# Patient Record
Sex: Male | Born: 1960
Health system: Southern US, Community
[De-identification: ages and names within clinical notes are randomized; demographics above are authoritative.]

## PROBLEM LIST (undated history)

## (undated) DIAGNOSIS — G4730 Sleep apnea, unspecified: Secondary | ICD-10-CM

## (undated) DIAGNOSIS — N200 Calculus of kidney: Secondary | ICD-10-CM

## (undated) DIAGNOSIS — F32A Depression, unspecified: Secondary | ICD-10-CM

## (undated) DIAGNOSIS — F329 Major depressive disorder, single episode, unspecified: Secondary | ICD-10-CM

## (undated) DIAGNOSIS — B159 Hepatitis A without hepatic coma: Secondary | ICD-10-CM

## (undated) HISTORY — DX: Calculus of kidney: N20.0

## (undated) HISTORY — DX: Sleep apnea, unspecified: G47.30

## (undated) HISTORY — DX: Hepatitis a without hepatic coma: B15.9

---

## 1984-01-06 HISTORY — PX: HERNIA REPAIR: SHX51

## 2005-01-04 ENCOUNTER — Emergency Department (HOSPITAL_COMMUNITY): Admission: EM | Admit: 2005-01-04 | Discharge: 2005-01-04 | Payer: Self-pay | Admitting: Emergency Medicine

## 2009-03-04 ENCOUNTER — Emergency Department (HOSPITAL_COMMUNITY): Admission: EM | Admit: 2009-03-04 | Discharge: 2009-03-04 | Payer: Self-pay | Admitting: Emergency Medicine

## 2014-01-15 ENCOUNTER — Encounter: Payer: Self-pay | Admitting: Cardiovascular Disease

## 2014-01-15 ENCOUNTER — Ambulatory Visit (INDEPENDENT_AMBULATORY_CARE_PROVIDER_SITE_OTHER): Payer: Managed Care, Other (non HMO) | Admitting: Cardiovascular Disease

## 2014-01-15 VITALS — BP 122/90 | HR 72 | Ht 71.0 in | Wt 210.0 lb

## 2014-01-15 DIAGNOSIS — F329 Major depressive disorder, single episode, unspecified: Secondary | ICD-10-CM

## 2014-01-15 DIAGNOSIS — F419 Anxiety disorder, unspecified: Secondary | ICD-10-CM

## 2014-01-15 DIAGNOSIS — F32A Depression, unspecified: Secondary | ICD-10-CM

## 2014-01-15 DIAGNOSIS — G4710 Hypersomnia, unspecified: Secondary | ICD-10-CM

## 2014-01-15 DIAGNOSIS — G4733 Obstructive sleep apnea (adult) (pediatric): Secondary | ICD-10-CM

## 2014-01-15 DIAGNOSIS — G4730 Sleep apnea, unspecified: Secondary | ICD-10-CM

## 2014-01-15 NOTE — Patient Instructions (Signed)
Your physician has recommended that you have a sleep study. This test records several body functions during sleep, including: brain activity, eye movement, oxygen and carbon dioxide blood levels, heart rate and rhythm, breathing rate and rhythm, the flow of air through your mouth and nose, snoring, body muscle movements, and chest and belly movement. This will be done at [facility].  They will call you with appointment.  Your physician recommends that you schedule a follow-up appointment with Dr. Claiborne Billings pending sleep study results.

## 2014-01-17 ENCOUNTER — Encounter: Payer: Self-pay | Admitting: Cardiovascular Disease

## 2014-01-17 DIAGNOSIS — G4733 Obstructive sleep apnea (adult) (pediatric): Secondary | ICD-10-CM | POA: Insufficient documentation

## 2014-01-17 DIAGNOSIS — F419 Anxiety disorder, unspecified: Secondary | ICD-10-CM | POA: Insufficient documentation

## 2014-01-17 DIAGNOSIS — G4710 Hypersomnia, unspecified: Secondary | ICD-10-CM

## 2014-01-17 DIAGNOSIS — F32A Depression, unspecified: Secondary | ICD-10-CM | POA: Insufficient documentation

## 2014-01-17 DIAGNOSIS — F329 Major depressive disorder, single episode, unspecified: Secondary | ICD-10-CM | POA: Insufficient documentation

## 2014-01-17 HISTORY — DX: Obstructive sleep apnea (adult) (pediatric): G47.33

## 2014-01-17 HISTORY — DX: Hypersomnia, unspecified: G47.10

## 2014-01-17 HISTORY — DX: Anxiety disorder, unspecified: F41.9

## 2014-01-17 NOTE — Progress Notes (Signed)
Patient ID: Sean Alexander, male   DOB: 1960-12-02, 54 y.o.   MRN: [id number]    PATIENT PROFILE: [name] is a 54 year old male who is referred for evaluation of possible sleep apnea.  HPI: Sean Alexander has a history of anxiety and depression and has been on Effexor for over 20 years.  Sean Alexander is followed by Dr. Candis Schatz who prescribes his Effexor.  Sean Alexander also has taken alprazolam on an as-needed basis.  Sean Alexander is currently employed at [employer] and works long hours.  Typically, Sean Alexander may work 13 days in a row and then only be off 1 day.  Sean Alexander works from 2:30 in the afternoon until approximately 1:59 AM in the morning.  When Sean Alexander goes home from work, Sean Alexander often goes to bed between 2:30 and 3 AM and wakes up at approximately 10 to 10:30 AM.  Sean Alexander snores loudly.  His sleep is nonrestorative.  Sean Alexander wakes up feeling almost as if it's time to go to bed.  Sean Alexander symptoms have worsened over the past year.  Reportedly, Sean Alexander has had witnessed apneic spells and admits at times to waking up gasping for breath.   Sean Alexander denies any episodes of chest pain.  Sean Alexander denies any awareness of tachycardia or nocturnal palpitations.  Sean Alexander denies  bruxism, restless legs, hypnogognic hallucinations, and is unaware of any catapletic spells.  An Epworth Sleepiness Scale today was calculated and this endorsed at 17 as noted below, which is compatible with significant hypersomnolence.  Epworth Sleepiness Scale: Situation   Chance of Dozing/Sleeping (0 = never , 1 = slight chance , 2 = moderate chance , 3 = high chance )   sitting and reading 2   watching TV 3   sitting inactive in a public place 3   being a passenger in a motor vehicle for an hour or more 2   lying down in the afternoon 3   sitting and talking to someone 2   sitting quietly after lunch (no alcohol) 2   while stopped for a few minutes in traffic as the driver 0   Total Score  17    Past Medical History  Diagnosis Date  . Hepatitis A virus infection     History reviewed. No  pertinent past surgical history.   Surgical history is notable for an umbilical hernia at approximately 1987.  Allergies  Allergen Reactions  . Sulfa Antibiotics Anaphylaxis    Rash on neck and sides  . Sulfur Rash    Current Outpatient Prescriptions  Medication Sig Dispense Refill  . ALPRAZolam (XANAX) 0.5 MG tablet Take 1 tablet by mouth as needed.    . Venlafaxine HCl 75 MG TB24 Take 1 tablet by mouth every morning.  0   No current facility-administered medications for this visit.    History   Social History  . Marital Status: Single    Spouse Name: N/A    Number of Children: N/A  . Years of Education: N/A   Occupational History  . Not on file.   Social History Main Topics  . Smoking status: Never Smoker   . Smokeless tobacco: Never Used  . Alcohol Use: No  . Drug Use: Not on file  . Sexual Activity: Not on file   Other Topics Concern  . Not on file   Social History Narrative   Socially Sean Alexander is married for 30 years.  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  Sean Alexander completed 14 grades of education.  There is no tobacco history.  Sean Alexander does not use illicit drugs, but does take occasional Xanax.  Sean Alexander does not routinely exercise.  Family History  Problem Relation Age of Onset  . Heart disease Mother   . Heart disease Father   . Diabetes Brother      ROS General: Negative; No fevers, chills, or night sweats HEENT: Negative; No changes in vision or hearing, sinus congestion, difficulty swallowing Pulmonary: Negative; No cough, wheezing, shortness of breath, hemoptysis Cardiovascular: Negative; No chest pain, presyncope, syncope, palpatations GI: Negative; No nausea, vomiting, diarrhea, or abdominal pain GU: Negative; No dysuria, hematuria, or difficulty voiding Musculoskeletal: Negative; no myalgias, joint pain, or weakness Hematologic: Negative; no easy bruising, bleeding Endocrine: Negative; no heat/cold intolerance Neuro: Negative; no changes in balance,  headaches Skin: Negative; No rashes or skin lesions Psychiatric: Positive for depression and anxiety Sleep: Positive for loud snoring, witnessed apnea, hypersomnolence and daytime sleepiness;  no bruxism, restless legs, hypnogognic hallucinations, no cataplexy   Physical Exam BP 122/90 mmHg  Pulse 72  Ht 5\' 11"  (1.803 m)  Wt 210 lb (95.255 kg)  BMI 29.30 kg/m2  PF 122 L/min  General: Alert, oriented, no distress.  Skin: normal turgor, no rashes HEENT: Normocephalic, atraumatic. Pupils round and reactive; sclera anicteric; extraocular muscles intact; Fundi normal Nose without nasal septal hypertrophy Mouth/Parynx benign; Mallinpatti scale 3 Neck: No JVD, no carotid bruits with normal carotid upstroke Lungs: clear to ausculatation and percussion; no wheezing or rales  Chest wall: No tenderness to palpation Heart: RRR, s1 s2 normal; no S3 or S4 gallop.  No rubs thrills or heaves  Abdomen: soft, nontender; no hepatosplenomehaly, BS+; abdominal aorta nontender and not dilated by palpation. Back: No CVA tenderness Pulses 2+ Extremities: no clubbinbg cyanosis or edema, Homan's sign negative  Neurologic: grossly nonfocal; cranial nerves intact. Psychological: Normal affect and mood.   LABS:  BMET No results found for: NA, K, CL, CO2, GLUCOSE, BUN, CREATININE, CALCIUM, GFRNONAA, GFRAA   Hepatic Function Panel  No results found for: PROT, ALBUMIN, AST, ALT, ALKPHOS, BILITOT, BILIDIR, IBILI   CBC No results found for: WBC, RBC, HGB, HCT, PLT, MCV, MCH, MCHC, RDW, LYMPHSABS, MONOABS, EOSABS, BASOSABS   BNP No results found for: PROBNP  Lipid Panel  No results found for: CHOL, TRIG, HDL, CHOLHDL, VLDL, LDLCALC, LDLDIRECT   RADIOLOGY: No results found.    ASSESSMENT AND PLAN: My impression is that Sean Alexander is a 54 -year-old male who presents with a symptom on plaques highly suggestive of obstructive sleep apnea.  However, the patient has a long-standing  history of depression for which she has been on Effexor therapy.  Is very possible that this may be suppressing REM sleep, which may play a role in his nonrestorative sleep, and fatigability.  However, Sean Alexander has had witnessed apneic spells and has been found to snore loudly.  I am scheduling him for a diagnostic polysomnogram with possible split-night study for further evaluation.  I discussed with him.  Normal sleep architecture and the adverse effects of sleep apnea on sleep architecture if present.  I also discussed with him the adverse cardiovascular effects associated with untreated sleep apnea.  There is family history for heart disease in both parents.  We will try to expedite his sleep study, but presently there is a significant delay in scheduling studies.     Troy Sine, MD, [facility]  01/17/2014 7:58 PM

## 2014-03-13 ENCOUNTER — Ambulatory Visit (HOSPITAL_BASED_OUTPATIENT_CLINIC_OR_DEPARTMENT_OTHER): Payer: Managed Care, Other (non HMO) | Attending: Cardiovascular Disease

## 2014-03-13 VITALS — Ht 71.0 in | Wt 210.0 lb

## 2014-03-13 DIAGNOSIS — F329 Major depressive disorder, single episode, unspecified: Secondary | ICD-10-CM

## 2014-03-13 DIAGNOSIS — G4730 Sleep apnea, unspecified: Secondary | ICD-10-CM | POA: Insufficient documentation

## 2014-03-13 DIAGNOSIS — G4733 Obstructive sleep apnea (adult) (pediatric): Secondary | ICD-10-CM

## 2014-03-13 DIAGNOSIS — F32A Depression, unspecified: Secondary | ICD-10-CM

## 2014-04-03 ENCOUNTER — Telehealth: Payer: Self-pay | Admitting: Cardiovascular Disease

## 2014-04-03 NOTE — Telephone Encounter (Signed)
Pt called in stating that he had a sleep study done on 3/8 and he has not received his results yet. Please f/u with pt  Thanks

## 2014-04-03 NOTE — Telephone Encounter (Signed)
There is not a report in the system so apparently it hasn't been read by Dr. Claiborne Billings yet.

## 2014-04-04 ENCOUNTER — Telehealth: Payer: Self-pay | Admitting: Cardiovascular Disease

## 2014-04-04 NOTE — Addendum Note (Signed)
Addended by: Shelva Majestic A on: 04/04/2014 05:45 PM   Modules accepted: Level of Service

## 2014-04-04 NOTE — Sleep Study (Signed)
   NAME: Sean Alexander DATE OF BIRTH:  1960/12/07 MEDICAL RECORD NUMBER [id number]  LOCATION: [facility]  PHYSICIAN: Adryan Druckenmiller A  DATE OF STUDY: 03/13/2014  SLEEP STUDY TYPE: Nocturnal Polysomnogram               REFERRING PHYSICIAN: Troy Sine, MD  INDICATION FOR STUDY: Sean Alexander is a 54 year old white male who admits to loud snoring, daytime sleepiness, and fatigue.  His sleep is nonrestorative.  He is referred for diagnostic polysomnogram to assess for obstructive sleep apnea.  EPWORTH SLEEPINESS SCORE:  17 HEIGHT: 5\' 11"  (180.3 cm)  WEIGHT: 210 lb (95.255 kg)    Body mass index is 29.3 kg/(m^2).  NECK SIZE: 17.5 in.  MEDICATIONS:  Venlafaxine HCl 75 MG TB24 1 tablet, Every morning - 10a  Note: Received from: External Pharmacy (Written 01/15/2014 1210)  ALPRAZolam (XANAX) 0.5 MG tablet 1 tablet, As needed  Note: Received from: External Pharmacy Received Sig: (Written 01/15/2014     SLEEP ARCHITECTURE:  The patient slept for 237 minutes out of a sleep period of time of 322.5 minutes.  Percent sleep efficiency was reduced at 61.6%.  The patient spent 7.5 minutes (3.2%) in stage I, 225 minutes (94.9%) in stage II, 4.5 minutes (1.9%) in stage III, and 0 minutes in REM sleep.  He slept 75 minutes or 31.6% in supine sleep.  Sleep architecture was abnormal with reduced slow wave sleep and absence of REM sleep.  There was mild to moderate snoring.  There were 25 arousals with an index of 6.3.  RESPIRATORY DATA:  During the sleep period, the patient had 1 obstructive apneas, 0 central and 0 mixed apneas, and 20 hypopneas of which 19 occurred in the supine position.  The apnea hypopnea index (AHI) was 5.3 per hour and respiratory disturbance index (RDI) was 7.3 per hour.  This places the patient in the category of mild obstructive sleep apnea.  However, REM sleep was not achieved and the reported AHI may underestimate the severity of the patient's sleep apnea if REM  sleep was present.  OXYGEN DATA:  The baseline oxygen saturation was 93%.  The oxygen nadir was 80% with non-REM sleep.  CARDIAC DATA:  The patient was in sinus rhythm with an average heart rate of 65 bpm.  There were rare to occasional PACs.  MOVEMENT/PARASOMNIA:  There were 6 periodic limb movements with an index of 1.5.  IMPRESSION/ RECOMMENDATION:   Mild obstructive sleep apnea/hypopnea syndrome; however, the AHI may underestimate the potential severity of the patient's sleep apnea due to the absence of REM sleep on the present study. Respiratory events with oxygen desaturation to a nadir of 80% during non-REM sleep. Mild to moderate snoring. Reduced sleep efficiency. Nocturnal myoclonus with questionable clinical significance. Rare occasional PACs.  In light of the patient's significant symptomatology and oxygen desaturation to a nadir of 80% CPAP titration is recommended.  If the patient does not wish to pursue CPAP therapy, alternatives to therapy, such as a customized oral dental appliance is recommended. Effort should be made to optimize nasal and oropharyngeal patency. The absence of REM sleep may be contributed by the patient's antidepressant medication therapy. The patient should be counseled to try avoiding sleep in the supine position  Sean Alexander A Diplomate, American Board of Sleep Medicine  ELECTRONICALLY SIGNED ON:  04/04/2014, 5:25 PM [place] PH: [phone]   FX: [phone] [place]

## 2014-04-04 NOTE — Telephone Encounter (Signed)
Returned call to patient sleep study results not available.Stated he had sleep study done 03/14/14.Message sent to [facility] and Mariann Laster.

## 2014-04-04 NOTE — Telephone Encounter (Signed)
Mild sleep apnea, but no REM sleep . O2 desaturation to 80%; mild-mod snoring. Schedule for CPAP. Avoid sleeping on back. If he is against CPAP then customized oral appliance.

## 2014-04-04 NOTE — Telephone Encounter (Signed)
Pt says he had a sleep study 3 weeks ago,he have not heard anything from it.

## 2014-04-05 NOTE — Telephone Encounter (Signed)
Left message that I will call him again to discuss sleep results.

## 2014-04-09 NOTE — Telephone Encounter (Signed)
Returning your call. °

## 2014-04-10 ENCOUNTER — Other Ambulatory Visit: Payer: Self-pay | Admitting: *Deleted

## 2014-04-10 DIAGNOSIS — G4733 Obstructive sleep apnea (adult) (pediatric): Secondary | ICD-10-CM

## 2014-04-10 NOTE — Telephone Encounter (Signed)
Pt's wife  called in stating that he had a sleep study done on 3/8 and she would like to get the results . Please give her a call back  Thanks

## 2014-04-10 NOTE — Telephone Encounter (Signed)
Patient returned a call and sleep study results and recommendations were discussed with patient. He voiced understanding and a CPAP titration study will be ordered.

## 2014-04-10 NOTE — Telephone Encounter (Signed)
Left message to return a call regarding sleep study.

## 2014-05-17 NOTE — Progress Notes (Signed)
Schedule for cpap

## 2014-05-23 NOTE — Progress Notes (Signed)
Patient called and given sleep study results and recommendations. CPAP titration scheduled for 06/05/14.

## 2014-06-05 ENCOUNTER — Ambulatory Visit (HOSPITAL_BASED_OUTPATIENT_CLINIC_OR_DEPARTMENT_OTHER): Payer: Managed Care, Other (non HMO) | Attending: Cardiovascular Disease

## 2014-06-05 VITALS — Ht 71.0 in | Wt 195.0 lb

## 2014-06-05 DIAGNOSIS — G4733 Obstructive sleep apnea (adult) (pediatric): Secondary | ICD-10-CM

## 2014-06-05 DIAGNOSIS — R0683 Snoring: Secondary | ICD-10-CM | POA: Insufficient documentation

## 2014-06-05 DIAGNOSIS — R5383 Other fatigue: Secondary | ICD-10-CM | POA: Insufficient documentation

## 2014-06-05 DIAGNOSIS — G4730 Sleep apnea, unspecified: Secondary | ICD-10-CM | POA: Insufficient documentation

## 2014-06-10 NOTE — Addendum Note (Signed)
Addended by: Shelva Majestic A on: 06/10/2014 01:53 PM   Modules accepted: Level of Service

## 2014-06-10 NOTE — Sleep Study (Signed)
   NAME: Sean Alexander DATE OF BIRTH:  [birth year] MEDICAL RECORD NUMBER [id number]  LOCATION: [facility]  PHYSICIAN: Kadin Canipe A  DATE OF STUDY: 06/05/2014  SLEEP STUDY TYPE: Nocturnal Polysomnogram               REFERRING PHYSICIAN: Troy Sine, MD  INDICATION FOR STUDY:  [name] is a 54 year old male who had experienced loud snoring, daytime sleepiness and fatigue.  A diagnostic polysomnogram revealed sleep apnea with an AHI of 5.3/hr, RDI of 7.3/hr, but REM sleep was not achieved.  He is referred for CPAP titration trial.  EPWORTH SLEEPINESS SCORE:  17 HEIGHT: 5\' 11"  (180.3 cm)  WEIGHT: 88.451 kg (195 lb)    Body mass index is 27.21 kg/(m^2).  NECK SIZE: 17.5 in.  MEDICATIONS:  ALPRAZolam (XANAX) 0.5 MG tablet 1 tablet, As needed  Note: Received from: External Pharmacy Received Sig: (Written 01/15/2014 1210)  Venlafaxine HCl 75 MG TB24 1 tablet, Every morning - 10a  SLEEP ARCHITECTURE:  Total recording time was 419 minutes.  Total sleep time 353.5 minutes.  Percent sleep efficiency was 84.4%.  The patient slept 23.5 minutes (6.6%) in stage I, 276 minutes (78.1%) in stage II, 1.5 minutes (0.4%) in stage III, and was able to achieve REM sleep and slept for 52.5 minutes (14.9%).  He slept for 48.4% of the night in the supine position, with supine REM sleep at 14.6%.  There were 24 arousals, with an index of 4.1 per hour.  RESPIRATORY DATA:  CPAP was started at 4 cm water pressure and was titrated up to 8 cm pressure.  Initially, the patient was unable to tolerate a full face mask. He was able to tolerate nasal pillows. Pressures were increased for snoring and respiratory events.  The majority of the respiratory events were in the supine position and during grams sleep.  The AHI 8 cm water pressure was 0.  Oral venting was noted during REM sleep and during the latter portion of the study.  OXYGEN DATA:  The baseline oxygen saturation was 96%.  The lowest  oxygen saturation was 86% in REM sleep, which occurred at 7 cm water pressure.  CARDIAC DATA:  The average heart rate was 62 bpm.  There were occasional PACs.  MOVEMENT/PARASOMNIA:  There were a total of 77 periodic limb movements, with an index of 13.1.  There were 2 periodic limb movement arousals with index of 0.3.  IMPRESSION/ RECOMMENDATION:   The patient tolerated CPAP well and was able to achieve REM supine sleep.  Recommend initial CPAP with an EPR of 3 at 8 cm water pressure and heated humidification.  Recommend nasal pillows with a chinstrap due to oral venting.  Recommend a download be obtained in 30 days in sleep clinic follow-up evaluation.    [place], American Board of Sleep Medicine  ELECTRONICALLY SIGNED ON:  06/10/2014, 1:42 PM [place] PH: [phone]   FX: [phone] [place]

## 2014-06-13 NOTE — Progress Notes (Signed)
Referred to [facility] for CPAP setup.

## 2014-06-15 ENCOUNTER — Encounter: Payer: Self-pay | Admitting: Cardiovascular Disease

## 2014-07-11 ENCOUNTER — Telehealth: Payer: Self-pay | Admitting: Cardiovascular Disease

## 2014-07-11 NOTE — Telephone Encounter (Signed)
Pt's wife called in stating that the pt had a sleep study done on 5/31 and they have not heard back about the results. Please call  Thanks

## 2014-07-11 NOTE — Telephone Encounter (Signed)
Called pt. Several times , phone keeps ringing busy

## 2014-07-23 ENCOUNTER — Telehealth: Payer: Self-pay | Admitting: Cardiovascular Disease

## 2014-07-23 NOTE — Telephone Encounter (Signed)
Spoke with wife she states the patient has not heard about the second part of C- PAP STUDY FROM 05/2014 RN informed wife - will have to investigate and call her back

## 2014-07-23 NOTE — Telephone Encounter (Signed)
Spoke to wife RN informed wife , the information that was received. RN apologized to wife, - not sure which DME company to use, will have to wait until t Mariann Laster returns   Wife aware to call 08/02/14- 08/03/14 for the next step

## 2014-07-23 NOTE — Telephone Encounter (Signed)
Spoke to  [facility] Know the status of patient being set up for C-PAP,order was faxed on 06/13/14  Per Judson Roch , order was cancelled, because company was out of network with patient's  Insurance [place] ) She states information was faxed but know confirmation date. RN thanked Judson Roch

## 2014-07-23 NOTE — Telephone Encounter (Signed)
Pt's wife is calling in to speak with Dr. Claiborne Billings about the results from his sleep study that was done on 5/31. Please f/u with her   Thanks

## 2014-08-02 NOTE — Telephone Encounter (Signed)
Called and spoke with Ivin Booty @ [facility] to see if they took patient's [employer]. She informs me that they take " some" [employer]. She requested for me to send her the referral. If they cannot do it then she will forward it to the appropriate MDE company. Patient's wife notified.

## 2014-11-14 ENCOUNTER — Telehealth: Payer: Self-pay | Admitting: *Deleted

## 2014-11-14 NOTE — Telephone Encounter (Signed)
Faxed order for [facility]AP download to [facility].

## 2014-11-15 ENCOUNTER — Telehealth: Payer: Self-pay | Admitting: Cardiology

## 2014-11-15 NOTE — Telephone Encounter (Signed)
Sean Alexander called in stating that an order was received from our office about the pt doing a download. But Sean Alexander says that he does not have a CPAP machine with them. Please f/u with her if you need to.  Thanks

## 2014-11-21 ENCOUNTER — Ambulatory Visit (INDEPENDENT_AMBULATORY_CARE_PROVIDER_SITE_OTHER): Payer: Managed Care, Other (non HMO) | Admitting: Cardiovascular Disease

## 2014-11-21 VITALS — BP 122/78 | HR 69 | Ht 71.0 in | Wt 203.4 lb

## 2014-11-21 DIAGNOSIS — F329 Major depressive disorder, single episode, unspecified: Secondary | ICD-10-CM | POA: Diagnosis not present

## 2014-11-21 DIAGNOSIS — Z9989 Dependence on other enabling machines and devices: Secondary | ICD-10-CM

## 2014-11-21 DIAGNOSIS — G4710 Hypersomnia, unspecified: Secondary | ICD-10-CM

## 2014-11-21 DIAGNOSIS — F419 Anxiety disorder, unspecified: Secondary | ICD-10-CM | POA: Diagnosis not present

## 2014-11-21 DIAGNOSIS — F32A Depression, unspecified: Secondary | ICD-10-CM

## 2014-11-21 DIAGNOSIS — G4733 Obstructive sleep apnea (adult) (pediatric): Secondary | ICD-10-CM | POA: Diagnosis not present

## 2014-11-21 NOTE — Patient Instructions (Addendum)
Your physician recommends that you schedule a follow-up appointment in 1 year or sooner if needed for sleep.

## 2014-11-23 ENCOUNTER — Encounter: Payer: Self-pay | Admitting: Cardiovascular Disease

## 2014-11-23 DIAGNOSIS — G4733 Obstructive sleep apnea (adult) (pediatric): Secondary | ICD-10-CM | POA: Insufficient documentation

## 2014-11-23 DIAGNOSIS — Z9989 Dependence on other enabling machines and devices: Secondary | ICD-10-CM

## 2014-11-23 HISTORY — DX: Obstructive sleep apnea (adult) (pediatric): G47.33

## 2014-11-23 NOTE — Progress Notes (Signed)
Patient ID: Sean Alexander, male   DOB: 1960/12/22, 54 y.o.   MRN: [id number]    HPI:  Sean Alexander is a 54 year old male who was referred for evaluation of possible sleep apnea.  He presents to the office today in the sleep clinic following initiation of CPAP therapy.   Sean Alexander has a history of anxiety and depression and has been on Effexor for over 20 years.  He is followed by Dr. Candis Alexander who prescribes his Effexor.  He also has taken alprazolam on an as-needed basis.   I had seen him for an initial evaluation in January 2016. He is currently employed at [employer] and works long hours.  Typically, he may work 13 days in a row and then only be off 1 day.  He works from 2:30 in the afternoon until approximately 1:59 AM in the morning.  When he goes home from work, he often goes to bed between 2:30 and 3 AM and wakes up at approximately 10 to 10:30 AM.  He snores loudly.  His sleep is nonrestorative.  He wakes up feeling almost as if it's time to go to bed.  He symptoms have worsened over the past year.  Reportedly, he has had witnessed apneic spells and admits at times to waking up gasping for breath.   When I initially saw him, he denied any episodes of chest pain, awareness of tachycardia or nocturnal palpitations.  He denied  bruxism, restless legs, hypnogognic hallucinations, and is unaware of any catapletic spells.  An Epworth Sleepiness Scale at that visit was calculated and  endorsed at 17 as noted below, which is compatible with significant hypersomnolence.  Epworth Sleepiness Scale: Situation   Chance of Dozing/Sleeping (0 = never , 1 = slight chance , 2 = moderate chance , 3 = high chance )   sitting and reading 2   watching TV 3   sitting inactive in a public place 3   being a passenger in a motor vehicle for an hour or more 2   lying down in the afternoon 3   sitting and talking to someone 2   sitting quietly after lunch (no alcohol) 2   while stopped for a few minutes in  traffic as the driver 0   Total Score  17    I referred him to undergo a diagnostic polysomnogram which was done on  03/13/2014. He was found to have mild obstructive sleep apnea/hypopnea syndrome; however, it was felt that the rest AHI was underestimated due to absence of rum sleeve on his study. His AHI was 5.3 and RDI 7.3. However, he had significant desaturation to 80% oxygen with non-REM sleep. Rare to occasional PACs.  He was referred for CPAP titration and ultimately 8.  Senna meter water pressure was recommended. Since initiating CPAP therapy, he has felt improved. His sleep is  More restorative.  A download was just obtained from 10/15/2014 through  November 2008 2016. He has met compliance with reference to usage stays being 77%.  However, he was just short of being compliant with greater than 4 hours use being only at 63%.  On days used is average time was 6 hours and 24 minutes.  His AHI is excellent at 8 cm water pressure at 0.8 per hour. He has a ResMed AirSense 10 AutoSet unit.  Past Medical History  Diagnosis Date  . Hepatitis A virus infection     No past surgical history on file.   Surgical  history is notable for an umbilical hernia at approximately 1987.  Allergies  Allergen Reactions  . Sulfa Antibiotics Anaphylaxis    Rash on neck and sides  . Sulfur Rash    Current Outpatient Prescriptions  Medication Sig Dispense Refill  . ALPRAZolam (XANAX) 0.5 MG tablet Take 1 tablet by mouth as needed.    . Venlafaxine HCl 75 MG TB24 Take 1 tablet by mouth every morning.  0   No current facility-administered medications for this visit.    Social History   Social History  . Marital Status: Married    Spouse Name: N/A  . Number of Children: N/A  . Years of Education: N/A   Occupational History  . Not on file.   Social History Main Topics  . Smoking status: Never Smoker   . Smokeless tobacco: Never Used  . Alcohol Use: No  . Drug Use: Not on file  . Sexual  Activity: Not on file   Other Topics Concern  . Not on file   Social History Narrative   Socially he is married for 30 years.  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  He completed 14 grades of education.  There is no tobacco history.  He does not use illicit drugs, but does take occasional Xanax.  He does not routinely exercise.  Family History  Problem Relation Age of Onset  . Heart disease Mother   . Heart disease Father   . Diabetes Brother      ROS General: Negative; No fevers, chills, or night sweats HEENT: Negative; No changes in vision or hearing, sinus congestion, difficulty swallowing Pulmonary: Negative; No cough, wheezing, shortness of breath, hemoptysis Cardiovascular: Negative; No chest pain, presyncope, syncope, palpatations GI: Negative; No nausea, vomiting, diarrhea, or abdominal pain GU: Negative; No dysuria, hematuria, or difficulty voiding Musculoskeletal: Negative; no myalgias, joint pain, or weakness Hematologic: Negative; no easy bruising, bleeding Endocrine: Negative; no heat/cold intolerance Neuro: Negative; no changes in balance, headaches Skin: Negative; No rashes or skin lesions Psychiatric: Positive for depression and anxiety Sleep: Positive for loud snoring, witnessed apnea, hypersomnolence and daytime sleepiness;  no bruxism, restless legs, hypnogognic hallucinations, no cataplexy   Physical Exam BP 122/78 mmHg  Pulse 69  Ht _0  (1.803 m)  Wt 203 lb 6.4 oz (92.262 kg)  BMI 28.38 kg/m2   Wt Readings from Last 3 Encounters:  11/21/14 203 lb 6.4 oz (92.262 kg)  06/05/14 195 lb (88.451 kg)  03/13/14 210 lb (95.255 kg)   General: Alert, oriented, no distress.  Skin: normal turgor, no rashes HEENT: Normocephalic, atraumatic. Pupils round and reactive; sclera anicteric; extraocular muscles intact; Fundi normal Nose without nasal septal hypertrophy Mouth/Parynx benign; Mallinpatti scale 3 Neck: No JVD, no carotid bruits with normal  carotid upstroke Lungs: clear to ausculatation and percussion; no wheezing or rales  Chest wall: No tenderness to palpation Heart: RRR, s1 s2 normal; no S3 or S4 gallop.  No rubs thrills or heaves  Abdomen: soft, nontender; no hepatosplenomehaly, BS+; abdominal aorta nontender and not dilated by palpation. Back: No CVA tenderness Pulses 2+ Extremities: no clubbinbg cyanosis or edema, Homan's sign negative  Neurologic: grossly nonfocal; cranial nerves intact. Psychological: Normal affect and mood.   LABS:  BMET No results found for: NA, K, CL, CO2, GLUCOSE, BUN, CREATININE, CALCIUM, GFRNONAA, GFRAA   Hepatic Function Panel  No results found for: PROT, ALBUMIN, AST, ALT, ALKPHOS, BILITOT, BILIDIR, IBILI   CBC No results found for: WBC, RBC, HGB, HCT,  PLT, MCV, MCH, MCHC, RDW, LYMPHSABS, MONOABS, EOSABS, BASOSABS   BNP No results found for: PROBNP  Lipid Panel  No results found for: CHOL, TRIG, HDL, CHOLHDL, VLDL, LDLCALC, LDLDIRECT   RADIOLOGY: No results found.    ASSESSMENT AND PLAN: My impression is that Sean Alexander is a 62 -year-old male who  I saw early this year and was concerned about a symptom complex, highly suggestive of obstructive sleep apnea. .  He has a history of depression and has been on antidepressant medication which may be suppressing his REM sleep. I had a long discussion with both he and his wife and reviewed both his nocturnal polysomnogram as well as his CPAP titration.  I discussed normal sleep architecture and the effect that sleep apnea has on development of abnormal sleep architecture.  We also discussed potential adverse cardiovascular consequences if sleep apnea is left untreated. He is meeting compliance standards with reference to days used but I had a long discussion with him regarding the importance of using CPAP throughout the entire night in order to meet compliance standards a minimum of 4 hours as necessary.  He is working the  second shift and typically goes to sleep at 1 AM. On therapy, his AHI is excellent at 0.8.  He no longer is snoring.  His sleep is more restorative. He uses [employer] for his DME company. I answered all his questions.  I will see him in one year for reevaluation.   Time spent: 25 minutes   Troy Sine, MD, [facility]  11/23/2014 10:40 PM

## 2015-01-06 DIAGNOSIS — N200 Calculus of kidney: Secondary | ICD-10-CM

## 2015-01-06 HISTORY — DX: Calculus of kidney: N20.0

## 2015-07-26 ENCOUNTER — Encounter: Payer: Self-pay | Admitting: Internal Medicine

## 2015-07-31 ENCOUNTER — Emergency Department (HOSPITAL_BASED_OUTPATIENT_CLINIC_OR_DEPARTMENT_OTHER)
Admission: EM | Admit: 2015-07-31 | Discharge: 2015-07-31 | Disposition: A | Discharge status: Home / Self Care | Payer: Managed Care, Other (non HMO) | Attending: Emergency Medicine | Admitting: Emergency Medicine

## 2015-07-31 ENCOUNTER — Emergency Department (HOSPITAL_BASED_OUTPATIENT_CLINIC_OR_DEPARTMENT_OTHER): Payer: Managed Care, Other (non HMO)

## 2015-07-31 ENCOUNTER — Encounter (HOSPITAL_BASED_OUTPATIENT_CLINIC_OR_DEPARTMENT_OTHER): Payer: Self-pay

## 2015-07-31 DIAGNOSIS — Z79899 Other long term (current) drug therapy: Secondary | ICD-10-CM | POA: Insufficient documentation

## 2015-07-31 DIAGNOSIS — N23 Unspecified renal colic: Secondary | ICD-10-CM | POA: Diagnosis not present

## 2015-07-31 DIAGNOSIS — N200 Calculus of kidney: Secondary | ICD-10-CM | POA: Diagnosis not present

## 2015-07-31 DIAGNOSIS — R109 Unspecified abdominal pain: Secondary | ICD-10-CM | POA: Diagnosis present

## 2015-07-31 LAB — CBC WITH DIFFERENTIAL/PLATELET
BASOS ABS: 0 10*3/uL (ref 0.0–0.1)
Basophils Relative: 0 %
EOS ABS: 0 10*3/uL (ref 0.0–0.7)
EOS PCT: 0 %
HCT: 38.7 % — ABNORMAL LOW (ref 39.0–52.0)
Hemoglobin: 13.2 g/dL (ref 13.0–17.0)
LYMPHS ABS: 1.3 10*3/uL (ref 0.7–4.0)
Lymphocytes Relative: 13 %
MCH: 31.7 pg (ref 26.0–34.0)
MCHC: 34.1 g/dL (ref 30.0–36.0)
MCV: 92.8 fL (ref 78.0–100.0)
MONO ABS: 0.8 10*3/uL (ref 0.1–1.0)
Monocytes Relative: 9 %
Neutro Abs: 7.6 10*3/uL (ref 1.7–7.7)
Neutrophils Relative %: 78 %
PLATELETS: 171 10*3/uL (ref 150–400)
RBC: 4.17 MIL/uL — AB (ref 4.22–5.81)
RDW: 12.5 % (ref 11.5–15.5)
WBC: 9.8 10*3/uL (ref 4.0–10.5)

## 2015-07-31 LAB — URINE MICROSCOPIC-ADD ON

## 2015-07-31 LAB — BASIC METABOLIC PANEL
Anion gap: 7 (ref 5–15)
BUN: 13 mg/dL (ref 6–20)
CO2: 26 mmol/L (ref 22–32)
CREATININE: 1 mg/dL (ref 0.61–1.24)
Calcium: 9.1 mg/dL (ref 8.9–10.3)
Chloride: 106 mmol/L (ref 101–111)
GFR calc Af Amer: >60 mL/min (ref >60)
GLUCOSE: 135 mg/dL — AB (ref 65–99)
Potassium: 3.8 mmol/L (ref 3.5–5.1)
SODIUM: 139 mmol/L (ref 135–145)

## 2015-07-31 LAB — URINALYSIS, ROUTINE W REFLEX MICROSCOPIC
BILIRUBIN URINE: NEGATIVE
Glucose, UA: NEGATIVE mg/dL
Ketones, ur: 15 mg/dL — AB
Nitrite: NEGATIVE
Protein, ur: 30 mg/dL — AB
SPECIFIC GRAVITY, URINE: 1.021 (ref 1.005–1.030)
pH: 6 (ref 5.0–8.0)

## 2015-07-31 MED ORDER — HYDROMORPHONE HCL 1 MG/ML IJ SOLN
1.0000 mg | Freq: Once | INTRAMUSCULAR | Status: AC
  Administered 2015-07-31: 1 mg via INTRAVENOUS
  Filled 2015-07-31: qty 1

## 2015-07-31 MED ORDER — KETOROLAC TROMETHAMINE 10 MG PO TABS: 10.0000 mg | ORAL_TABLET | Freq: Four times a day (QID) | ORAL | 0 refills | Status: DC | PRN

## 2015-07-31 MED ORDER — ONDANSETRON HCL 4 MG PO TABS
4.0000 mg | ORAL_TABLET | Freq: Four times a day (QID) | ORAL | 0 refills | Status: DC
Start: 2015-07-31 — End: 2015-09-16

## 2015-07-31 MED ORDER — SODIUM CHLORIDE 0.9 % IV BOLUS (SEPSIS)
1000.0000 mL | Freq: Once | INTRAVENOUS | Status: AC
  Administered 2015-07-31: 1000 mL via INTRAVENOUS

## 2015-07-31 MED ORDER — ONDANSETRON HCL 4 MG/2ML IJ SOLN
4.0000 mg | Freq: Once | INTRAMUSCULAR | Status: AC
  Administered 2015-07-31: 4 mg via INTRAVENOUS
  Filled 2015-07-31: qty 2

## 2015-07-31 MED ORDER — OXYCODONE-ACETAMINOPHEN 5-325 MG PO TABS
1.0000 | ORAL_TABLET | Freq: Four times a day (QID) | ORAL | 0 refills | Status: DC | PRN
Start: 2015-07-31 — End: 2015-09-16

## 2015-07-31 MED ORDER — MORPHINE SULFATE (PF) 4 MG/ML IV SOLN
4.0000 mg | Freq: Once | INTRAVENOUS | Status: AC
  Administered 2015-07-31: 4 mg via INTRAVENOUS
  Filled 2015-07-31: qty 1

## 2015-07-31 MED FILL — KETOROLAC 10 MG TABLET: 10 | 5 days supply | Qty: 20 | Fill #0

## 2015-07-31 MED FILL — ONDANSETRON HCL 4 MG TABLET: 4 | 3 days supply | Qty: 12 | Fill #0

## 2015-07-31 MED FILL — OXYCODONE/APAP 5-325: 5-325 | 4 days supply | Qty: 15 | Fill #0

## 2015-07-31 NOTE — ED Provider Notes (Signed)
[facility] Provider Note   CSN: [id number] Arrival date & time: 07/31/15  [id number]  First Provider Contact:  First MD Initiated Contact with Patient 07/31/15 [phone]        History   Chief Complaint Chief Complaint  Patient presents with  . Abdominal Pain    HPI Sean Alexander is a 55 y.o. male.  Pt woke up around 0500 with severe abd pain, n/v.  Pt said that it continued and is unbearable.  He has his wife drive him here due to the pain.  Pt has never had anything like this.   The history is provided by the patient.  Abdominal Pain   This is a new problem. The current episode started 3 to 5 hours ago. The problem occurs constantly. The problem has been gradually worsening. The pain is located in the RLQ. The pain is at a severity of 10/10. The pain is severe. Associated symptoms include vomiting.    Past Medical History:  Diagnosis Date  . Hepatitis A virus infection     Patient Active Problem List   Diagnosis Date Noted  . OSA on CPAP 11/23/2014  . Depression 01/17/2014  . Evaluate for Obstructive sleep apnea 01/17/2014  . Hypersomnolence 01/17/2014  . Anxiety 01/17/2014    Past Surgical History:  Procedure Laterality Date  . HERNIA REPAIR         Home Medications    Prior to Admission medications   Medication Sig Start Date End Date Taking? Authorizing Provider  ALPRAZolam Duanne Moron) 0.5 MG tablet Take 1 tablet by mouth as needed. 01/10/14  Yes Historical Provider, MD  Venlafaxine HCl 75 MG TB24 Take 1 tablet by mouth every morning. 12/15/13  Yes Historical Provider, MD  ketorolac (TORADOL) 10 MG tablet Take 1 tablet (10 mg total) by mouth every 6 (six) hours as needed. 07/31/15   Isla Pence, MD  ondansetron (ZOFRAN) 4 MG tablet Take 1 tablet (4 mg total) by mouth every 6 (six) hours. 07/31/15   Isla Pence, MD  oxyCODONE-acetaminophen (PERCOCET/ROXICET) 5-325 MG tablet Take 1 tablet by mouth every 6 (six) hours as needed for severe pain. 07/31/15    Isla Pence, MD    Family History Family History  Problem Relation Age of Onset  . Heart disease Mother   . Heart disease Father   . Diabetes Brother     Social History Social History  Substance Use Topics  . Smoking status: Never Smoker  . Smokeless tobacco: Never Used  . Alcohol use No     Allergies   Sulfa antibiotics; Penicillins; and Sulfur   Review of Systems Review of Systems  Gastrointestinal: Positive for abdominal pain and vomiting.  All other systems reviewed and are negative.    Physical Exam Updated Vital Signs BP 140/85   Pulse (!) 51   Temp 97.7 F (36.5 C) (Oral)   Resp 20   Ht 5\' 10"  (1.778 m)   Wt 194 lb (88 kg)   SpO2 96%   BMI 27.84 kg/m   Physical Exam  Constitutional: He is oriented to person, place, and time. He appears well-developed and well-nourished. He appears distressed.  HENT:  Head: Normocephalic and atraumatic.  Right Ear: External ear normal.  Left Ear: External ear normal.  Nose: Nose normal.  Mouth/Throat: Oropharynx is clear and moist.  Eyes: Conjunctivae and EOM are normal. Pupils are equal, round, and reactive to light.  Neck: Normal range of motion. Neck supple.  Cardiovascular: Normal rate, regular rhythm,  normal heart sounds and intact distal pulses.   Pulmonary/Chest: Effort normal and breath sounds normal.  Abdominal: Soft. Bowel sounds are normal.  Musculoskeletal: Normal range of motion.  Neurological: He is alert and oriented to person, place, and time.  Skin: Skin is warm and dry.  Psychiatric: He has a normal mood and affect. His behavior is normal. Judgment and thought content normal.     ED Treatments / Results  Labs (all labs ordered are listed, but only abnormal results are displayed) Labs Reviewed  BASIC METABOLIC PANEL - Abnormal; Notable for the following:       Result Value   Glucose, Bld 135 (*)    All other components within normal limits  CBC WITH DIFFERENTIAL/PLATELET - Abnormal;  Notable for the following:    RBC 4.17 (*)    HCT 38.7 (*)    All other components within normal limits  URINALYSIS, ROUTINE W REFLEX MICROSCOPIC (NOT AT [facility]) - Abnormal; Notable for the following:    Color, Urine AMBER (*)    APPearance CLOUDY (*)    Hgb urine dipstick LARGE (*)    Ketones, ur 15 (*)    Protein, ur 30 (*)    Leukocytes, UA TRACE (*)    All other components within normal limits  URINE MICROSCOPIC-ADD ON - Abnormal; Notable for the following:    Squamous Epithelial / LPF 0-5 (*)    Bacteria, UA FEW (*)    All other components within normal limits    EKG  EKG Interpretation None       Radiology Ct Renal Vayda Study  Result Date: 07/31/2015 CLINICAL DATA:  Right groin pain since early this morning with vomiting and diarrhea. EXAM: CT ABDOMEN AND PELVIS WITHOUT CONTRAST TECHNIQUE: Multidetector CT imaging of the abdomen and pelvis was performed following the standard protocol without IV contrast. COMPARISON:  None. FINDINGS: Lower chest:  Unremarkable. Hepatobiliary: The liver shows diffusely decreased attenuation suggesting steatosis. There is no evidence for gallstones, gallbladder wall thickening, or pericholecystic fluid. No intrahepatic or extrahepatic biliary dilation. Pancreas: No focal mass lesion. No dilatation of the main duct. No intraparenchymal cyst. No peripancreatic edema. Spleen: No splenomegaly. No focal mass lesion. Adrenals/Urinary Tract: No adrenal nodule or mass. Mild right hydroureteronephrosis with perinephric and periureteric edema is evident. The right ureter is dilated down to about the level of the iliac vasculature. Just distal to where the ureter crosses the iliac vessels, a 3 x 3 x 6 mm ureteral Jozwiak is identified. No left renal or ureteral stones. No bladder stones. Stomach/Bowel: Stomach is nondistended. No gastric wall thickening. No evidence of outlet obstruction. Duodenum is normally positioned as is the ligament of Treitz. No small bowel  wall thickening. No small bowel dilatation. The terminal ileum is normal. The appendix is normal. No gross colonic mass. No colonic wall thickening. No substantial diverticular change. Vascular/Lymphatic: There is abdominal aortic atherosclerosis without aneurysm. There is no gastrohepatic or hepatoduodenal ligament lymphadenopathy. No intraperitoneal or retroperitoneal lymphadenopathy. No pelvic sidewall lymphadenopathy. Reproductive: The prostate gland and seminal vesicles have normal imaging features. Other: No intraperitoneal free fluid. Musculoskeletal: Bone windows reveal no worrisome lytic or sclerotic osseous lesions. IMPRESSION: 1. 3 x 3 x 6 mm distal right ureteral Tirey causes mild right hydroureteronephrosis. 2. Abdominal aortic atherosclerosis. Electronically Signed   By: Misty Stanley M.D.   On: 07/31/2015 08:41   Procedures Procedures (including critical care time)  Medications Ordered in ED Medications  sodium chloride 0.9 % bolus 1,000 mL (0  mLs Intravenous Stopped 07/31/15 0927)  morphine 4 MG/ML injection 4 mg (4 mg Intravenous Given 07/31/15 0814)  ondansetron (ZOFRAN) injection 4 mg (4 mg Intravenous Given 07/31/15 0814)  HYDROmorphone (DILAUDID) injection 1 mg (1 mg Intravenous Given 07/31/15 0911)  sodium chloride 0.9 % bolus 1,000 mL (1,000 mLs Intravenous New Bag/Given 07/31/15 1001)     Initial Impression / Assessment and Plan / ED Course  I have reviewed the triage vital signs and the nursing notes.  Pertinent labs & imaging results that were available during my care of the patient were reviewed by me and considered in my medical decision making (see chart for details).  Clinical Course   Pain is gone.  Pt feels much better.  He is given the number for Urology.  He knows to return if worse.  Final Clinical Impressions(s) / ED Diagnoses   Final diagnoses:  Kidney Quijas  Ureteral colic    New Prescriptions New Prescriptions   KETOROLAC (TORADOL) 10 MG TABLET     Take 1 tablet (10 mg total) by mouth every 6 (six) hours as needed.   ONDANSETRON (ZOFRAN) 4 MG TABLET    Take 1 tablet (4 mg total) by mouth every 6 (six) hours.   OXYCODONE-ACETAMINOPHEN (PERCOCET/ROXICET) 5-325 MG TABLET    Take 1 tablet by mouth every 6 (six) hours as needed for severe pain.     Isla Pence, MD 07/31/15 1139

## 2015-07-31 NOTE — ED Triage Notes (Signed)
Reports woke up around 5am with n/v/d and then sudden onset of severe right lower quad pain.

## 2015-09-16 ENCOUNTER — Encounter (HOSPITAL_BASED_OUTPATIENT_CLINIC_OR_DEPARTMENT_OTHER): Payer: Self-pay | Admitting: *Deleted

## 2015-09-16 ENCOUNTER — Emergency Department (HOSPITAL_BASED_OUTPATIENT_CLINIC_OR_DEPARTMENT_OTHER)
Admission: EM | Admit: 2015-09-16 | Discharge: 2015-09-16 | Disposition: A | Discharge status: Home / Self Care | Payer: Managed Care, Other (non HMO) | Attending: Emergency Medicine | Admitting: Emergency Medicine

## 2015-09-16 ENCOUNTER — Emergency Department (HOSPITAL_BASED_OUTPATIENT_CLINIC_OR_DEPARTMENT_OTHER): Payer: Managed Care, Other (non HMO)

## 2015-09-16 DIAGNOSIS — N200 Calculus of kidney: Secondary | ICD-10-CM | POA: Insufficient documentation

## 2015-09-16 DIAGNOSIS — R109 Unspecified abdominal pain: Secondary | ICD-10-CM | POA: Diagnosis present

## 2015-09-16 HISTORY — DX: Major depressive disorder, single episode, unspecified: F32.9

## 2015-09-16 HISTORY — DX: Depression, unspecified: F32.A

## 2015-09-16 LAB — COMPREHENSIVE METABOLIC PANEL
ALBUMIN: 4.5 g/dL (ref 3.5–5.0)
ALK PHOS: 69 U/L (ref 38–126)
ALT: 22 U/L (ref 17–63)
ANION GAP: 9 (ref 5–15)
AST: 22 U/L (ref 15–41)
BILIRUBIN TOTAL: 0.8 mg/dL (ref 0.3–1.2)
BUN: 11 mg/dL (ref 6–20)
CALCIUM: 9.6 mg/dL (ref 8.9–10.3)
CO2: 27 mmol/L (ref 22–32)
Chloride: 103 mmol/L (ref 101–111)
Creatinine, Ser: 0.98 mg/dL (ref 0.61–1.24)
GFR calc Af Amer: >60 mL/min (ref >60)
GLUCOSE: 145 mg/dL — AB (ref 65–99)
Potassium: 4.1 mmol/L (ref 3.5–5.1)
Sodium: 139 mmol/L (ref 135–145)
TOTAL PROTEIN: 8.1 g/dL (ref 6.5–8.1)

## 2015-09-16 LAB — URINALYSIS, ROUTINE W REFLEX MICROSCOPIC
Bilirubin Urine: NEGATIVE
GLUCOSE, UA: NEGATIVE mg/dL
KETONES UR: NEGATIVE mg/dL
LEUKOCYTES UA: NEGATIVE
NITRITE: NEGATIVE
PH: 5.5 (ref 5.0–8.0)
Protein, ur: NEGATIVE mg/dL
SPECIFIC GRAVITY, URINE: 1.025 (ref 1.005–1.030)

## 2015-09-16 LAB — CBC WITH DIFFERENTIAL/PLATELET
BASOS PCT: 0 %
Basophils Absolute: 0 10*3/uL (ref 0.0–0.1)
Eosinophils Absolute: 0 10*3/uL (ref 0.0–0.7)
Eosinophils Relative: 0 %
HEMATOCRIT: 42.3 % (ref 39.0–52.0)
HEMOGLOBIN: 14.6 g/dL (ref 13.0–17.0)
LYMPHS ABS: 1.1 10*3/uL (ref 0.7–4.0)
LYMPHS PCT: 16 %
MCH: 31.1 pg (ref 26.0–34.0)
MCHC: 34.5 g/dL (ref 30.0–36.0)
MCV: 90.2 fL (ref 78.0–100.0)
MONO ABS: 1 10*3/uL (ref 0.1–1.0)
MONOS PCT: 15 %
NEUTROS ABS: 4.6 10*3/uL (ref 1.7–7.7)
NEUTROS PCT: 69 %
Platelets: 184 10*3/uL (ref 150–400)
RBC: 4.69 MIL/uL (ref 4.22–5.81)
RDW: 12.8 % (ref 11.5–15.5)
WBC: 6.8 10*3/uL (ref 4.0–10.5)

## 2015-09-16 LAB — URINE MICROSCOPIC-ADD ON

## 2015-09-16 MED ORDER — ONDANSETRON 4 MG PO TBDP: 4.0000 mg | ORAL_TABLET | Freq: Three times a day (TID) | ORAL | 0 refills | Status: DC | PRN

## 2015-09-16 MED ORDER — FENTANYL CITRATE (PF) 100 MCG/2ML IJ SOLN
50.0000 ug | INTRAMUSCULAR | Status: DC | PRN
  Administered 2015-09-16: 50 ug via INTRAVENOUS
  Filled 2015-09-16: qty 2

## 2015-09-16 MED ORDER — SODIUM CHLORIDE 0.9 % IV BOLUS (SEPSIS)
1000.0000 mL | Freq: Once | INTRAVENOUS | Status: AC
  Administered 2015-09-16: 1000 mL via INTRAVENOUS

## 2015-09-16 MED ORDER — ONDANSETRON HCL 4 MG/2ML IJ SOLN
4.0000 mg | Freq: Once | INTRAMUSCULAR | Status: AC
  Administered 2015-09-16: 4 mg via INTRAVENOUS
  Filled 2015-09-16: qty 2

## 2015-09-16 MED ORDER — OXYCODONE-ACETAMINOPHEN 5-325 MG PO TABS: 1.0000 | ORAL_TABLET | ORAL | 0 refills | Status: DC | PRN

## 2015-09-16 MED ORDER — MORPHINE SULFATE (PF) 4 MG/ML IV SOLN
4.0000 mg | Freq: Once | INTRAVENOUS | Status: AC
  Administered 2015-09-16: 4 mg via INTRAVENOUS
  Filled 2015-09-16: qty 1

## 2015-09-16 MED ORDER — KETOROLAC TROMETHAMINE 30 MG/ML IJ SOLN
30.0000 mg | Freq: Once | INTRAMUSCULAR | Status: AC
  Administered 2015-09-16: 30 mg via INTRAVENOUS
  Filled 2015-09-16: qty 1

## 2015-09-16 NOTE — ED Notes (Signed)
Bladder scanned patient scanner read 0ML, notified PA and nurse Jenny Reichmann

## 2015-09-16 NOTE — ED Provider Notes (Signed)
[facility] DEPT [facility] Provider Note   CSN: [id number] Arrival date & time: 09/16/15  0947     History   Chief Complaint Chief Complaint  Patient presents with  . Flank Pain    HPI Sean Alexander is a 55 y.o. male.  [name] with past medical history of depression and kidney Shaver who presents the ED with complaint of right flank pain, onset 6 AM. Patient reports he woke up this morning due to having sharp deep right flank pain that has been waxing and waning throughout the morning. Endorses associated nausea and one episode of NBNB vomiting. He states his flank pain that radiates around to his right lower abdomen. He notes the pain feels similar to pain he had last month when he was diagnosed with a kidney Engelmann. He states he passed his Rahm a few weeks ago and has been symptomatic free until this morning. Denies taking any medications for his symptoms. Denies fever, chills, chest pain, abdominal pain, diarrhea, constipation, urinary symptoms, hematuria. Endorses surgical history of umbilical hernia repair.      Past Medical History:  Diagnosis Date  . Depression   . Hepatitis A virus infection     Patient Active Problem List   Diagnosis Date Noted  . OSA on CPAP 11/23/2014  . Depression 01/17/2014  . Evaluate for Obstructive sleep apnea 01/17/2014  . Hypersomnolence 01/17/2014  . Anxiety 01/17/2014    Past Surgical History:  Procedure Laterality Date  . HERNIA REPAIR         Home Medications    Prior to Admission medications   Medication Sig Start Date End Date Taking? Authorizing Provider  ALPRAZolam Duanne Moron) 0.5 MG tablet Take 1 tablet by mouth as needed. 01/10/14   Historical Provider, MD  ondansetron (ZOFRAN ODT) 4 MG disintegrating tablet Take 1 tablet (4 mg total) by mouth every 8 (eight) hours as needed for nausea or vomiting. 09/16/15   Nona Dell, PA-C  oxyCODONE-acetaminophen (PERCOCET/ROXICET) 5-325 MG tablet Take 1  tablet by mouth every 4 (four) hours as needed. 09/16/15   Nona Dell, PA-C  Venlafaxine HCl 75 MG TB24 Take 1 tablet by mouth every morning. 12/15/13   Historical Provider, MD    Family History Family History  Problem Relation Age of Onset  . Heart disease Mother   . Heart disease Father   . Diabetes Brother     Social History Social History  Substance Use Topics  . Smoking status: Never Smoker  . Smokeless tobacco: Never Used  . Alcohol use No     Allergies   Sulfa antibiotics; Penicillins; and Sulfur   Review of Systems Review of Systems  Gastrointestinal: Positive for abdominal pain (right lower), nausea and vomiting.  Genitourinary: Positive for flank pain (right).  All other systems reviewed and are negative.    Physical Exam Updated Vital Signs BP 145/94 (BP Location: Left Arm)   Pulse 60   Temp 98.1 F (36.7 C) (Oral)   Resp 16   SpO2 100%   Physical Exam  Constitutional: He is oriented to person, place, and time. He appears well-developed and well-nourished.  Pt appears to be in discomfort and is constantly changing position and moving around in the bed.  HENT:  Head: Normocephalic and atraumatic.  Mouth/Throat: Uvula is midline, oropharynx is clear and moist and mucous membranes are normal. No oropharyngeal exudate, posterior oropharyngeal edema, posterior oropharyngeal erythema or tonsillar abscesses. No tonsillar exudate.  Eyes: Conjunctivae and EOM  are normal. Right eye exhibits no discharge. Left eye exhibits no discharge. No scleral icterus.  Neck: Normal range of motion. Neck supple.  Cardiovascular: Normal rate, regular rhythm, normal heart sounds and intact distal pulses.   Pulmonary/Chest: Effort normal and breath sounds normal. No respiratory distress. He has no wheezes. He has no rales. He exhibits no tenderness.  Abdominal: Soft. Bowel sounds are normal. He exhibits no distension and no mass. There is tenderness (RLQ). There is CVA  tenderness (right). There is no rigidity, no rebound, no guarding, no tenderness at McBurney's point and negative Murphy's sign. No hernia.  Musculoskeletal: Normal range of motion. He exhibits no edema.  Neurological: He is alert and oriented to person, place, and time.  Skin: Skin is warm and dry.  Nursing note and vitals reviewed.    ED Treatments / Results  Labs (all labs ordered are listed, but only abnormal results are displayed) Labs Reviewed  URINALYSIS, ROUTINE W REFLEX MICROSCOPIC (NOT AT [facility]) - Abnormal; Notable for the following:       Result Value   APPearance TURBID (*)    Hgb urine dipstick LARGE (*)    All other components within normal limits  COMPREHENSIVE METABOLIC PANEL - Abnormal; Notable for the following:    Glucose, Bld 145 (*)    All other components within normal limits  URINE MICROSCOPIC-ADD ON - Abnormal; Notable for the following:    Squamous Epithelial / LPF 0-5 (*)    Bacteria, UA RARE (*)    Casts GRANULAR CAST (*)    All other components within normal limits  CBC WITH DIFFERENTIAL/PLATELET    EKG  EKG Interpretation None       Radiology Ct Renal Lipson Study  Result Date: 09/16/2015 CLINICAL DATA:  Right flank pain radiating into the right lower quadrant with urinary urgency. History of kidney stones. EXAM: CT ABDOMEN AND PELVIS WITHOUT CONTRAST TECHNIQUE: Multidetector CT imaging of the abdomen and pelvis was performed following the standard protocol without IV contrast. COMPARISON:  CT 07/31/2015 FINDINGS: Lower chest: Mild dependent atelectasis at both lung bases. No significant pleural or pericardial effusion. Hepatobiliary: Mildly decreased hepatic density again noted, suggesting steatosis. No focal abnormalities identified on noncontrast imaging. No evidence of gallstones, gallbladder wall thickening or biliary dilatation. Pancreas: Unremarkable. No pancreatic ductal dilatation or surrounding inflammatory changes. Spleen: Normal in size  without focal abnormality. Adrenals/Urinary Tract: Both adrenal glands appear normal. There is persistent right-sided hydronephrosis and perinephric soft tissue stranding. 4 mm calculus is noted at the right ureteral vesicle junction. This has progressed distally compared with the prior study. No other urinary tract calculi are seen. The left kidney and bladder appear unremarkable. Stomach/Bowel: No evidence of bowel wall thickening, distention or surrounding inflammatory change. The appendix appears normal. Vascular/Lymphatic: There are no enlarged abdominal or pelvic lymph nodes. Minimal aortoiliac atherosclerosis. Reproductive: The prostate gland and seminal vesicles appear stable without suspicious findings. Other: No evidence of abdominal wall mass or hernia.  No ascites. Musculoskeletal: No acute or significant osseous findings. Mild degenerative changes of the hips. IMPRESSION: 1. Interval distal progression of 75mm right ureteral calculus, now at the ureterovesical junction. There is persistent right-sided hydronephrosis and perinephric soft tissue stranding consistent with ureteral obstruction. This calculus is faintly visible on the scout image. 2. No other urinary tract calculi demonstrated. 3. Hepatic steatosis. Electronically Signed   By: Richardean Sale M.D.   On: 09/16/2015 15:02    Procedures Procedures (including critical care time)  Medications Ordered in  ED Medications  fentaNYL (SUBLIMAZE) injection 50 mcg (50 mcg Intravenous Given 09/16/15 1010)  ondansetron (ZOFRAN) injection 4 mg (4 mg Intravenous Given 09/16/15 1010)  ketorolac (TORADOL) 30 MG/ML injection 30 mg (30 mg Intravenous Given 09/16/15 1046)  morphine 4 MG/ML injection 4 mg (4 mg Intravenous Given 09/16/15 1046)  sodium chloride 0.9 % bolus 1,000 mL (0 mLs Intravenous Stopped 09/16/15 1321)  sodium chloride 0.9 % bolus 1,000 mL (0 mLs Intravenous Stopped 09/16/15 1500)     Initial Impression / Assessment and Plan / ED  Course  I have reviewed the triage vital signs and the nursing notes.  Pertinent labs & imaging results that were available during my care of the patient were reviewed by me and considered in my medical decision making (see chart for details).  Clinical Course    Patient is a 55 year old male with history of depression and kidney stones who presents to the ED with complaint of right flank pain that started this morning. He notes he was diagnosed with a right kidney Daye last month which she reports passing a few days later. VSS. Exam revealed right lower quadrant tenderness and right CVA tenderness, no peritoneal signs. Remaining exam unremarkable. Patient given pain meds, IV fluids and Zofran.  UA revealed large hemoglobin, no evidence of infection. Will order CT renal study for evaluation of kidney Ishman. Remaining labs unremarkable. CT showed 4 mm right ureteral calculus at the ureterovesicular junction. On reevaluation patient reports his pain has significantly improved. Discussed results and plan for discharge with patient. Plan to discharge patient home with pain meds and symptomatic treatment. Patient given information to follow up with urology regarding his kidney Odwyer. Discussed strict return precautions with patient.  Final Clinical Impressions(s) / ED Diagnoses   Final diagnoses:  Kidney Xin    New Prescriptions Discharge Medication List as of 09/16/2015  3:38 PM    START taking these medications   Details  ondansetron (ZOFRAN ODT) 4 MG disintegrating tablet Take 1 tablet (4 mg total) by mouth every 8 (eight) hours as needed for nausea or vomiting., Starting Mon 09/16/2015, Print    oxyCODONE-acetaminophen (PERCOCET/ROXICET) 5-325 MG tablet Take 1 tablet by mouth every 4 (four) hours as needed., Starting Mon 09/16/2015, Print         Chesley Noon Bajandas, [place] 09/16/15 [place], MD 09/18/15 [phone]

## 2015-09-16 NOTE — Discharge Instructions (Signed)
Take your medications as prescribed as needed for pain relief. Continue drinking fluids at home to remain hydrated. The urology clinic listed above for follow-up and reevaluation regarding your kidney stones. Please return to the Emergency Department if symptoms worsen or new onset of fever, abdominal pain, vomiting, unable to keep fluids down, unable to urinate, pain with urination.

## 2015-09-16 NOTE — ED Triage Notes (Signed)
Pt reports sudden onset this am of right flank pain similar to his renal Cobern pain, last [place] one month ago.

## 2015-09-16 NOTE — ED Notes (Signed)
Patient transported to Ultrasound 

## 2015-09-16 NOTE — ED Notes (Signed)
Pt is diaphoretic and pacing around the room in pain.

## 2015-09-25 ENCOUNTER — Ambulatory Visit: Payer: Managed Care, Other (non HMO) | Admitting: Family Medicine

## 2015-10-07 ENCOUNTER — Encounter: Payer: Self-pay | Admitting: Internal Medicine

## 2015-10-07 ENCOUNTER — Ambulatory Visit (INDEPENDENT_AMBULATORY_CARE_PROVIDER_SITE_OTHER): Payer: Managed Care, Other (non HMO) | Admitting: Internal Medicine

## 2015-10-07 VITALS — BP 124/80 | HR 80 | Ht 70.0 in | Wt 197.0 lb

## 2015-10-07 DIAGNOSIS — Z1211 Encounter for screening for malignant neoplasm of colon: Secondary | ICD-10-CM

## 2015-10-07 DIAGNOSIS — R194 Change in bowel habit: Secondary | ICD-10-CM

## 2015-10-07 MED ORDER — NA SULFATE-K SULFATE-MG SULF 17.5-3.13-1.6 GM/177ML PO SOLN: 1.0000 | Freq: Once | ORAL | 0 refills | Status: AC

## 2015-10-07 NOTE — Patient Instructions (Signed)
If you are age 53 or older, your body mass index should be between 23-30. Your Body mass index is 28.27 kg/m. If this is out of the aforementioned range listed, please consider follow up with your Primary Care Provider.  If you are age 33 or younger, your body mass index should be between 19-25. Your Body mass index is 28.27 kg/m. If this is out of the aformentioned range listed, please consider follow up with your Primary Care Provider.   You have been scheduled for a colonoscopy. Please follow written instructions given to you at your visit today.  Please pick up your prep supplies at the pharmacy within the next 1-3 days. If you use inhalers (even only as needed), please bring them with you on the day of your procedure. Your physician has requested that you go to www.startemmi.com and enter the access code given to you at your visit today. This web site gives a general overview about your procedure. However, you should still follow specific instructions given to you by our office regarding your preparation for the procedure.  Thank you for choosing  GI  Dr Wilfrid Lund III

## 2015-10-07 NOTE — Progress Notes (Signed)
HISTORY OF PRESENT ILLNESS:  Sean BRENDA "scott" is a 55 y.o. 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 Patient's wife is a patient of mine. He reports 6 month history of bowel habits that have been somewhat more constipated. He has noticed on occasion that when he defecates this is associated with diaphoresis and nausea. No vomiting or syncope. No bleeding though he did notice something in the perirectal region which he thought might be a hemorrhoid. He treated this with topical agent. No bleeding or weight loss. No family history of colon cancer.  REVIEW OF SYSTEMS:  All non-GI ROS negative except for hematuria  Past Medical History:  Diagnosis Date  . Depression   . Hepatitis A virus infection   . Kidney stones   . Sleep apnea    uses CPAP    Past Surgical History:  Procedure Laterality Date  . [place]  reports that he has never smoked. He has never used smokeless tobacco. He reports that he does not drink alcohol or use drugs.  family history includes Diabetes in his brother; Heart disease in his father and mother.  Allergies  Allergen Reactions  . Sulfa Antibiotics Anaphylaxis    Rash on neck and sides  . Penicillins Rash       PHYSICAL EXAMINATION: Vital signs: BP 124/80 (BP Location: Left Arm, Patient Position: Sitting, Cuff Size: Normal)   Pulse 80   Ht 5\' 10"  (1.778 m)   Wt 197 lb (89.4 kg)   BMI 28.27 kg/m   Constitutional: generally well-appearing, no acute distress Psychiatric: alert and oriented x3, cooperative Eyes: extraocular movements intact, anicteric, conjunctiva pink Mouth: oral pharynx moist, no lesions Neck: supple no lymphadenopathy Cardiovascular: heart regular rate and rhythm, no murmur Lungs: clear to auscultation bilaterally Abdomen: soft, nontender, nondistended,  no obvious ascites, no peritoneal signs, normal bowel sounds, no organomegaly Rectal:Deferred until colonoscopy Extremities: no clubbing cyanosis or lower extremity edema bilaterally Skin: no lesions on visible extremities Neuro: No focal deficits. Cranial nerves intact. Normal DTRs  ASSESSMENT:  #1. Constipation #2. Vagal symptoms associated with defecation #3. Colon cancer screening. Appropriate candidate without contraindication   PLAN:  #1. Daily fiber supplementation with Metamucil and increased water consumption #2. Screening colonoscopy.The nature of the procedure, as well as the risks, benefits, and alternatives were carefully and thoroughly reviewed with the patient. Ample time for discussion and questions allowed. The patient understood, was satisfied, and agreed to proceed.

## 2015-10-17 ENCOUNTER — Encounter: Payer: Self-pay | Admitting: Internal Medicine

## 2015-10-19 ENCOUNTER — Emergency Department (HOSPITAL_COMMUNITY)
Admission: EM | Admit: 2015-10-19 | Discharge: 2015-10-19 | Disposition: A | Discharge status: Home / Self Care | Payer: Managed Care, Other (non HMO) | Attending: Emergency Medicine | Admitting: Emergency Medicine

## 2015-10-19 ENCOUNTER — Encounter (HOSPITAL_COMMUNITY): Payer: Self-pay | Admitting: Emergency Medicine

## 2015-10-19 DIAGNOSIS — R109 Unspecified abdominal pain: Secondary | ICD-10-CM | POA: Diagnosis present

## 2015-10-19 DIAGNOSIS — N200 Calculus of kidney: Secondary | ICD-10-CM | POA: Insufficient documentation

## 2015-10-19 DIAGNOSIS — Z79899 Other long term (current) drug therapy: Secondary | ICD-10-CM | POA: Diagnosis not present

## 2015-10-19 LAB — URINALYSIS, ROUTINE W REFLEX MICROSCOPIC
Bilirubin Urine: NEGATIVE
Glucose, UA: NEGATIVE mg/dL
Ketones, ur: 15 mg/dL — AB
LEUKOCYTES UA: NEGATIVE
Nitrite: NEGATIVE
PROTEIN: NEGATIVE mg/dL
SPECIFIC GRAVITY, URINE: 1.025 (ref 1.005–1.030)
pH: 8 (ref 5.0–8.0)

## 2015-10-19 LAB — URINE MICROSCOPIC-ADD ON

## 2015-10-19 MED ORDER — KETOROLAC TROMETHAMINE 60 MG/2ML IM SOLN
30.0000 mg | Freq: Once | INTRAMUSCULAR | Status: AC
  Administered 2015-10-19: 30 mg via INTRAMUSCULAR
  Filled 2015-10-19: qty 2

## 2015-10-19 MED ORDER — OXYCODONE-ACETAMINOPHEN 5-325 MG PO TABS: 1.0000 | ORAL_TABLET | ORAL | 0 refills | Status: DC | PRN

## 2015-10-19 MED ORDER — ONDANSETRON HCL 4 MG/2ML IJ SOLN
4.0000 mg | Freq: Once | INTRAMUSCULAR | Status: AC
  Administered 2015-10-19: 4 mg via INTRAVENOUS
  Filled 2015-10-19: qty 2

## 2015-10-19 MED ORDER — OXYCODONE-ACETAMINOPHEN 5-325 MG PO TABS
1.0000 | ORAL_TABLET | ORAL | Status: DC | PRN
  Administered 2015-10-19: 1 via ORAL
  Filled 2015-10-19 (×2): qty 1

## 2015-10-19 MED ORDER — HYDROMORPHONE HCL 1 MG/ML IJ SOLN
1.0000 mg | Freq: Once | INTRAMUSCULAR | Status: AC
  Administered 2015-10-19: 1 mg via INTRAVENOUS
  Filled 2015-10-19: qty 1

## 2015-10-19 NOTE — ED Provider Notes (Signed)
[facility] DEPT Provider Note   CSN: [id number] Arrival date & time: 10/19/15  1333     History   Chief Complaint Chief Complaint  Patient presents with  . Flank Pain    HPI CHAZE LIGHTSEY is a 55 y.o. male.  Patient is 55 yo M with known right kidney Popiel, last evaluated in ED on 9/11 for similar complaint, presenting with uncontrollable right groin and flank pain starting at 6:30 AM. States he was asymptomatic yesterday, but developed sharp pain, that is radiating to his right testicle. Also reports one episode of vomiting this morning due to pan. Pain has been waxing and waning throughout the morning, and currently rated 6/10. Pain at its most severe point 10/10. Denies pain with urination, but decreased flow and "dribbling at times." Denies recent fever, chills, chest pain, shortness of breath, or abdominal pain. He has upcoming appointment with urology on 11/2 but states he could not wait due to severe pain.       Past Medical History:  Diagnosis Date  . Depression   . Hepatitis A virus infection   . Kidney stones   . Sleep apnea    uses CPAP    Patient Active Problem List   Diagnosis Date Noted  . OSA on CPAP 11/23/2014  . Depression 01/17/2014  . Evaluate for Obstructive sleep apnea 01/17/2014  . Hypersomnolence 01/17/2014  . Anxiety 01/17/2014    Past Surgical History:  Procedure Laterality Date  . HERNIA REPAIR  1986       Home Medications    Prior to Admission medications   Medication Sig Start Date End Date Taking? Authorizing Provider  ALPRAZolam Duanne Moron) 0.5 MG tablet Take 1 tablet by mouth as needed for anxiety.  01/10/14  Yes Historical Provider, MD  ondansetron (ZOFRAN ODT) 4 MG disintegrating tablet Take 1 tablet (4 mg total) by mouth every 8 (eight) hours as needed for nausea or vomiting. 09/16/15  Yes Nona Dell, PA-C  oxyCODONE-acetaminophen (PERCOCET/ROXICET) 5-325 MG tablet Take 1 tablet by mouth every 4 (four) hours as  needed. 09/16/15  Yes Nona Dell, PA-C  venlafaxine XR (EFFEXOR-XR) 75 MG 24 hr capsule Take 75 mg by mouth daily. 08/31/15  Yes Historical Provider, MD    Family History Family History  Problem Relation Age of Onset  . Heart disease Mother   . Heart disease Father   . Diabetes Brother   . Colon cancer Neg Hx     Social History Social History  Substance Use Topics  . Smoking status: Never Smoker  . Smokeless tobacco: Never Used  . Alcohol use No     Allergies   Sulfa antibiotics and Penicillins   Review of Systems Review of Systems  Constitutional: Negative for chills and fever.  HENT: Negative for ear pain and sore throat.   Eyes: Negative for pain and visual disturbance.  Respiratory: Negative for cough and shortness of breath.   Cardiovascular: Negative for chest pain, palpitations and leg swelling.  Gastrointestinal: Negative for abdominal pain, blood in stool, nausea and vomiting.  Genitourinary: Positive for decreased urine volume and flank pain. Negative for dysuria and hematuria.  Musculoskeletal: Negative for back pain and neck pain.  Skin: Negative for color change and rash.  Neurological: Negative for dizziness, seizures, syncope, weakness, numbness and headaches.     Physical Exam Updated Vital Signs BP 140/89 (BP Location: Left Arm)   Pulse (!) 50   Temp 97.4 F (36.3 C) (Oral)   Resp 12  Ht 5\' 9"  (1.753 m)   Wt 89 kg   SpO2 100%   BMI 28.96 kg/m   Physical Exam  Constitutional: He appears well-developed and well-nourished. No distress.  HENT:  Head: Normocephalic and atraumatic.  Mouth/Throat: Oropharynx is clear and moist.  Eyes: Conjunctivae are normal.  Neck: Normal range of motion.  Cardiovascular: Normal rate, regular rhythm, normal heart sounds and intact distal pulses.   Pulmonary/Chest: Effort normal and breath sounds normal. No respiratory distress.  Abdominal: Soft. There is no tenderness.  Mild right CVA tenderness.    Musculoskeletal: Normal range of motion. He exhibits no edema or tenderness.  Neurological: He is alert.  Skin: Skin is warm and dry.  Psychiatric: He has a normal mood and affect.  Nursing note and vitals reviewed.    ED Treatments / Results  Labs (all labs ordered are listed, but only abnormal results are displayed) Labs Reviewed  URINALYSIS, ROUTINE W REFLEX MICROSCOPIC (NOT AT [facility]) - Abnormal; Notable for the following:       Result Value   APPearance CLOUDY (*)    Hgb urine dipstick LARGE (*)    Ketones, ur 15 (*)    All other components within normal limits  URINE MICROSCOPIC-ADD ON - Abnormal; Notable for the following:    Squamous Epithelial / LPF 0-5 (*)    Bacteria, UA RARE (*)    All other components within normal limits    EKG  EKG Interpretation None       Radiology No results found.  Procedures Procedures (including critical care time)  Medications Ordered in ED Medications  oxyCODONE-acetaminophen (PERCOCET/ROXICET) 5-325 MG per tablet 1 tablet (1 tablet Oral Given 10/19/15 1405)  HYDROmorphone (DILAUDID) injection 1 mg (not administered)  ondansetron (ZOFRAN) injection 4 mg (not administered)     Initial Impression / Assessment and Plan / ED Course  I have reviewed the triage vital signs and the nursing notes.  Pertinent labs & imaging results that were available during my care of the patient were reviewed by me and considered in my medical decision making (see chart for details).  Clinical Course   Patient is 55 yo M with known right kidney Mucha, presenting with uncontrollable pain. Reviewed note from ED visit on 9/11, and CT imaging obtained at that time showed 4 mm right ureteral Yuhas at ureterovesical junction. Urinalysis shows large Hgb. Patient is afebrile, and no concern for infected Bring. Per discussion with attending physician, Dr. Christ Kick, plan is for pain control and f/u with urology. Given IV Dilaudid and Zofran. On  reassessment, patient states pain well controlled. Dr. Eulis Foster consulted urology to move patient's appointment to earlier date. Dr. Alexis Frock at [facility] called back and stated he will try to get patient first available appointment next week. Prior to d/c, patient given IM Toradol, as well as prescription for Percocet, and instructed to call [facility] on Monday to reschedule appointment. Also provided resources to establish care with PCP. Patient appreciative of care, agreeable to plan for f/u, and understood return precautions to ED.    Final Clinical Impressions(s) / ED Diagnoses   Final diagnoses:  Kidney Pacifico    New Prescriptions New Prescriptions   No medications on file     Rosilyn Mings II, PA 10/19/15 1745    Daleen Bo, MD 10/19/15 1920

## 2015-10-19 NOTE — ED Triage Notes (Signed)
Pt has known R side kidney Go. Began to have increased R flank pain last night. Has upcoming appointment with urology, but could not wait due to pain.

## 2015-10-19 NOTE — ED Provider Notes (Signed)
  Face-to-face evaluation   History: He presents for evaluation of worsening right flank radiating to right groin pain, within the last day and a half. Similar pain, and diagnosed with right ureteral Sayre, distal, about one month ago. Is referred to urology at that point, but does not have an appointment, until 11-07-15  Physical exam: Alert, uncomfortable, lucid, and calm. No respiratory distress.  16:40- paged to urology- case discussed with Dr. Tresa Moore, who will arrange for the patient be seen first available appointment, early next week.  Flomax, cannot be used because of sulfa allergy, anaphylaxis in this patient.   Medical screening examination/treatment/procedure(s) were conducted as a shared visit with non-physician practitioner(s) and myself.  I personally evaluated the patient during the encounter   Daleen Bo, MD 10/19/15 1920

## 2015-10-19 NOTE — Discharge Instructions (Signed)
Please call [facility], and take Percocet as directed for pain. Also, please see the following list of primary care providers to establish care with PCP.   RESOURCE GUIDE  Chronic Pain Problems: Contact [facility]  [phone] Patients need to be referred by their primary care doctor.  Insufficient Money for Medicine: Contact [employer] Way:  call [phone]  No Primary Care Doctor: Call [facility]  [phone] - can help you locate a primary care doctor that  accepts your insurance, provides certain services, etc. Physician Referral Service- [phone]  Agencies that provide inexpensive medical care: [facility]  [facility]  [phone] [facility]  [phone] [facility]  [phone] [facility]  [phone] [facility]  [phone]  [place] Providers: [facility]- [address], [address]  [phone], Mon-Fri 9am-7pm, Sat 9am-1pm [place], [address] [place]  [place], [address] [place]  [place]- [address]  [place], [address], [phone]  Only accepts [place] patients after they have their name  applied to their card  Self Pay (no insurance) in [facility]: Sickle Cell Patients - [facility]  [place], [facility]- [facility] [place]- [phone] [address], [facility]- see information above (Speak to [employer] if you do not have insurance)       -  [facility]- [place],  [place] [place], [place]  Dr Vista Lawman-  [address], [place], [place], [place]        -  [facility] and [place] [address], [phone]       -  [facility]- [address], [place], also [address], [phone]       -     [facility]- [address], [place], 1st & 3rd Saturday         every month, 10am-1pm  -     [place]   [place] [address] [place], [place].   Phone:  [phone], Fax:  [phone]. Hours of Operation:  9 am - 6 pm, M-F.  -     [name]   [address], [address], [place]   Phone: [phone], Fax: [phone]. Hours of Operation:  8:30 am - 5:30 pm, M-F.    Dental Assistance If unable to pay or uninsured, contact:  [facility]. to become qualified for the adult dental clinic.  Patients with Medicaid: [facility] [phone] [address], [place] [address], [phone]  If unable to pay, or uninsured, contact [facility] [phone] in [place], [place] in [facility]) to become qualified for the adult dental clinic  [facility]) [address] [place], [place] [address] [phone] [email or web address]  Other [place]: [facility]- [address], [place], [place], [address], [phone], [phone], 2nd  and 4th Thursday of the month at 6:30am.  10 clients each day by appointment, can sometimes see walk-in patients if someone does not show for an appointment. [facility]- [address] [place], [place], [address], [place], [place], [place], [address], [place] Department- [phone] [place] [facility] [phone]

## 2015-10-19 NOTE — ED Notes (Signed)
Patient given urine cup

## 2015-10-22 ENCOUNTER — Other Ambulatory Visit: Payer: Self-pay | Admitting: Urology

## 2015-10-28 ENCOUNTER — Encounter: Payer: Self-pay | Admitting: Internal Medicine

## 2015-10-28 ENCOUNTER — Ambulatory Visit (AMBULATORY_SURGERY_CENTER): Payer: Managed Care, Other (non HMO) | Admitting: Internal Medicine

## 2015-10-28 VITALS — BP 126/82 | HR 54 | Temp 99.1°F | Resp 18 | Ht 66.0 in | Wt 166.0 lb

## 2015-10-28 DIAGNOSIS — Z1211 Encounter for screening for malignant neoplasm of colon: Secondary | ICD-10-CM

## 2015-10-28 DIAGNOSIS — D122 Benign neoplasm of ascending colon: Secondary | ICD-10-CM

## 2015-10-28 DIAGNOSIS — Z1212 Encounter for screening for malignant neoplasm of rectum: Secondary | ICD-10-CM

## 2015-10-28 MED ORDER — SODIUM CHLORIDE 0.9 % IV SOLN: 500.0000 mL | INTRAVENOUS | Status: DC

## 2015-10-28 NOTE — Progress Notes (Signed)
Called to room to assist during endoscopic procedure.  Patient ID and intended procedure confirmed with present staff. Received instructions for my participation in the procedure from the performing physician.  

## 2015-10-28 NOTE — Patient Instructions (Signed)
YOU HAD AN ENDOSCOPIC PROCEDURE TODAY AT THE [facility]:   Refer to the procedure report that was given to you for any specific questions about what was found during the examination.  If the procedure report does not answer your questions, please call your gastroenterologist to clarify.  If you requested that your care partner not be given the details of your procedure findings, then the procedure report has been included in a sealed envelope for you to review at your convenience later.  YOU SHOULD EXPECT: Some feelings of bloating in the abdomen. Passage of more gas than usual.  Walking can help get rid of the air that was put into your GI tract during the procedure and reduce the bloating. If you had a lower endoscopy (such as a colonoscopy or flexible sigmoidoscopy) you may notice spotting of blood in your stool or on the toilet paper. If you underwent a bowel prep for your procedure, you may not have a normal bowel movement for a few days.  Please Note:  You might notice some irritation and congestion in your nose or some drainage.  This is from the oxygen used during your procedure.  There is no need for concern and it should clear up in a day or so.  SYMPTOMS TO REPORT IMMEDIATELY:   Following lower endoscopy (colonoscopy or flexible sigmoidoscopy):  Excessive amounts of blood in the stool  Significant tenderness or worsening of abdominal pains  Swelling of the abdomen that is new, acute  Fever of 100F or higher    For urgent or emergent issues, a gastroenterologist can be reached at any hour by calling [phone].   DIET:  We do recommend a small meal at first, but then you may proceed to your regular diet.  Drink plenty of fluids but you should avoid alcoholic beverages for 24 hours.  ACTIVITY:  You should plan to take it easy for the rest of today and you should NOT DRIVE or use heavy machinery until tomorrow (because of the sedation medicines used during the test).     FOLLOW UP: Our staff will call the number listed on your records the next business day following your procedure to check on you and address any questions or concerns that you may have regarding the information given to you following your procedure. If we do not reach you, we will leave a message.  However, if you are feeling well and you are not experiencing any problems, there is no need to return our call.  We will assume that you have returned to your regular daily activities without incident.  If any biopsies were taken you will be contacted by phone or by letter within the next 1-3 weeks.  Please call us at [phone] if you have not heard about the biopsies in 3 weeks.    SIGNATURES/CONFIDENTIALITY: You and/or your care partner have signed paperwork which will be entered into your electronic medical record.  These signatures attest to the fact that that the information above on your After Visit Summary has been reviewed and is understood.  Full responsibility of the confidentiality of this discharge information lies with you and/or your care-partner.   Resume medications. Information given on polyps and hemorrhoids. 

## 2015-10-28 NOTE — Progress Notes (Signed)
Report to PACU, RN, vss, BBS= Clear.  

## 2015-10-28 NOTE — Op Note (Signed)
[facility] Patient Name: Sean Alexander Procedure Date: 10/28/2015 2:52 PM MRN: [id number] Endoscopist: Docia Chuck. Henrene Pastor , MD Age: 55 Referring MD:  Date of Birth: Nov 26, 1960 Gender: Male Account #: [id number] Procedure:                Colonoscopy, with cold snare polypectomy x 1 Indications:              Screening for colorectal malignant neoplasm Medicines:                Monitored Anesthesia Care Procedure:                Pre-Anesthesia Assessment:                           - Prior to the procedure, a History and Physical                            was performed, and patient medications and                            allergies were reviewed. The patient's tolerance of                            previous anesthesia was also reviewed. The risks                            and benefits of the procedure and the sedation                            options and risks were discussed with the patient.                            All questions were answered, and informed consent                            was obtained. Prior Anticoagulants: The patient has                            taken no previous anticoagulant or antiplatelet                            agents. ASA Grade Assessment: II - A patient with                            mild systemic disease. After reviewing the risks                            and benefits, the patient was deemed in                            satisfactory condition to undergo the procedure.                           After obtaining informed consent, the colonoscope  was passed under direct vision. Throughout the                            procedure, the patient's blood pressure, pulse, and                            oxygen saturations were monitored continuously. The                            Model CF-HQ190L [id number]) scope was introduced                            through the anus and advanced to the the cecum,            identified by appendiceal orifice and ileocecal                            valve. The ileocecal valve, appendiceal orifice,                            and rectum were photographed. The quality of the                            bowel preparation was good. The colonoscopy was                            performed without difficulty. The patient tolerated                            the procedure well. The bowel preparation used was                            SUPREP. Scope In: 3:06:11 PM Scope Out: 3:17:16 PM Scope Withdrawal Time: 0 hours 8 minutes 57 seconds  Total Procedure Duration: 0 hours 11 minutes 5 seconds  Findings:                 A 1 mm polyp was found in the ascending colon. The                            polyp was removed with a cold snare. Resection and                            retrieval were complete.                           Internal hemorrhoids were found during retroflexion.                           The exam was otherwise without abnormality on                            direct and retroflexion views. Complications:            No immediate complications. Estimated blood loss:  None. Estimated Blood Loss:     Estimated blood loss: none. Impression:               - One 1 mm polyp in the ascending colon, removed                            with a cold snare. Resected and retrieved.                           - Internal hemorrhoids.                           - The examination was otherwise normal on direct                            and retroflexion views. Recommendation:           - Repeat colonoscopy in 5-10 years for surveillance.                           - Patient has a contact number available for                            emergencies. The signs and symptoms of potential                            delayed complications were discussed with the                            patient. Return to normal activities tomorrow.                             Written discharge instructions were provided to the                            patient.                           - Resume previous diet.                           - Continue present medications.                           - Await pathology results. Docia Chuck. Henrene Pastor, MD 10/28/2015 3:20:52 PM This report has been signed electronically.

## 2015-10-29 ENCOUNTER — Telehealth: Payer: Self-pay | Admitting: *Deleted

## 2015-10-29 NOTE — Telephone Encounter (Signed)
  Follow up Call-  Call back number 10/28/2015  Post procedure Call Back phone  # [phone]  Permission to leave phone message Yes  Some recent data might be hidden     Patient questions:  Message left to call us if necessary.

## 2015-10-30 ENCOUNTER — Telehealth: Payer: Self-pay | Admitting: *Deleted

## 2015-10-30 NOTE — Telephone Encounter (Signed)
  Follow up Call-  Call back number 10/28/2015  Post procedure Call Back phone  # [phone]  Permission to leave phone message Yes  Some recent data might be hidden   [facility]

## 2015-10-31 ENCOUNTER — Encounter: Payer: Self-pay | Admitting: Internal Medicine

## 2015-11-06 ENCOUNTER — Encounter (HOSPITAL_BASED_OUTPATIENT_CLINIC_OR_DEPARTMENT_OTHER): Admission: RE | Payer: Self-pay | Source: Ambulatory Visit

## 2015-11-06 ENCOUNTER — Ambulatory Visit (HOSPITAL_BASED_OUTPATIENT_CLINIC_OR_DEPARTMENT_OTHER): Admission: RE | Admit: 2015-11-06 | Payer: Managed Care, Other (non HMO) | Source: Ambulatory Visit | Admitting: Urology

## 2015-11-06 SURGERY — CYSTOURETEROSCOPY, WITH RETROGRADE PYELOGRAM AND STENT INSERTION
Anesthesia: General | Laterality: Right

## 2015-12-22 ENCOUNTER — Emergency Department (HOSPITAL_COMMUNITY): Payer: Managed Care, Other (non HMO)

## 2015-12-22 ENCOUNTER — Encounter (HOSPITAL_BASED_OUTPATIENT_CLINIC_OR_DEPARTMENT_OTHER): Payer: Self-pay | Admitting: Emergency Medicine

## 2015-12-22 ENCOUNTER — Emergency Department (HOSPITAL_BASED_OUTPATIENT_CLINIC_OR_DEPARTMENT_OTHER)
Admission: EM | Admit: 2015-12-22 | Discharge: 2015-12-22 | Disposition: A | Discharge status: Home / Self Care | Payer: Managed Care, Other (non HMO) | Attending: Emergency Medicine | Admitting: Emergency Medicine

## 2015-12-22 DIAGNOSIS — M545 Low back pain: Secondary | ICD-10-CM | POA: Diagnosis not present

## 2015-12-22 DIAGNOSIS — Z79899 Other long term (current) drug therapy: Secondary | ICD-10-CM | POA: Diagnosis not present

## 2015-12-22 DIAGNOSIS — R200 Anesthesia of skin: Secondary | ICD-10-CM | POA: Diagnosis not present

## 2015-12-22 DIAGNOSIS — Z87442 Personal history of urinary calculi: Secondary | ICD-10-CM | POA: Insufficient documentation

## 2015-12-22 DIAGNOSIS — M546 Pain in thoracic spine: Secondary | ICD-10-CM | POA: Diagnosis not present

## 2015-12-22 DIAGNOSIS — M549 Dorsalgia, unspecified: Secondary | ICD-10-CM

## 2015-12-22 HISTORY — DX: Calculus of kidney: N20.0

## 2015-12-22 LAB — CBC WITH DIFFERENTIAL/PLATELET
Basophils Absolute: 0 10*3/uL (ref 0.0–0.1)
Basophils Relative: 1 %
EOS PCT: 1 %
Eosinophils Absolute: 0.1 10*3/uL (ref 0.0–0.7)
HCT: 36.6 % — ABNORMAL LOW (ref 39.0–52.0)
HEMOGLOBIN: 12.5 g/dL — AB (ref 13.0–17.0)
LYMPHS ABS: 2.3 10*3/uL (ref 0.7–4.0)
LYMPHS PCT: 35 %
MCH: 31.6 pg (ref 26.0–34.0)
MCHC: 34.2 g/dL (ref 30.0–36.0)
MCV: 92.4 fL (ref 78.0–100.0)
MONOS PCT: 10 %
Monocytes Absolute: 0.7 10*3/uL (ref 0.1–1.0)
NEUTROS PCT: 53 %
Neutro Abs: 3.4 10*3/uL (ref 1.7–7.7)
Platelets: 174 10*3/uL (ref 150–400)
RBC: 3.96 MIL/uL — AB (ref 4.22–5.81)
RDW: 12.7 % (ref 11.5–15.5)
WBC: 6.5 10*3/uL (ref 4.0–10.5)

## 2015-12-22 LAB — BASIC METABOLIC PANEL
Anion gap: 7 (ref 5–15)
BUN: 13 mg/dL (ref 6–20)
CHLORIDE: 104 mmol/L (ref 101–111)
CO2: 27 mmol/L (ref 22–32)
CREATININE: 0.8 mg/dL (ref 0.61–1.24)
Calcium: 9 mg/dL (ref 8.9–10.3)
GFR calc Af Amer: >60 mL/min (ref >60)
GFR calc non Af Amer: >60 mL/min (ref >60)
GLUCOSE: 103 mg/dL — AB (ref 65–99)
POTASSIUM: 4 mmol/L (ref 3.5–5.1)
Sodium: 138 mmol/L (ref 135–145)

## 2015-12-22 LAB — TROPONIN I: Troponin I: <0.03 ng/mL (ref <0.03)

## 2015-12-22 MED ORDER — HALOPERIDOL LACTATE 5 MG/ML IJ SOLN
2.0000 mg | Freq: Once | INTRAMUSCULAR | Status: AC
  Administered 2015-12-22: 2 mg via INTRAVENOUS
  Filled 2015-12-22: qty 1

## 2015-12-22 MED ORDER — HYDROCODONE-ACETAMINOPHEN 5-325 MG PO TABS: 1.0000 | ORAL_TABLET | Freq: Four times a day (QID) | ORAL | 0 refills | Status: DC | PRN

## 2015-12-22 MED ORDER — DIAZEPAM 5 MG PO TABS
5.0000 mg | ORAL_TABLET | Freq: Once | ORAL | Status: AC
  Administered 2015-12-22: 5 mg via ORAL
  Filled 2015-12-22: qty 1

## 2015-12-22 MED ORDER — OXYCODONE-ACETAMINOPHEN 5-325 MG PO TABS
2.0000 | ORAL_TABLET | Freq: Once | ORAL | Status: AC
  Administered 2015-12-22: 2 via ORAL
  Filled 2015-12-22: qty 2

## 2015-12-22 NOTE — ED Notes (Signed)
Pt states he feels he may not be able to lie on MRI table for longer than 5 minutes d/t numbness in bil legs when lies or sits for any length of time. Dr Tegeler aware.

## 2015-12-22 NOTE — ED Notes (Signed)
Spouse has arrived to room.

## 2015-12-22 NOTE — ED Triage Notes (Signed)
Patient c/o bilateral low back pain, radiating into both legs causing numbness and tingling. Patient took hydrocodone left from a prior visit which helped. Patient also tried heat/cold therapy with no significant change.

## 2015-12-22 NOTE — ED Notes (Signed)
Pt returned from CT via w/c.

## 2015-12-22 NOTE — ED Notes (Signed)
Provider in w pt.

## 2015-12-22 NOTE — ED Provider Notes (Signed)
[facility] DEPT Provider Note/Inter-hospital transfer   CSN: [id number] Arrival date & time: 12/22/15  1211      HPI Sean Alexander is a 55 y.o. male.  The history is provided by the patient, the spouse and medical records. No language interpreter was used.     This is a 55 year old male with past medical history of kidney stones, depression, sleep apnea who presents today with lower thoracic and upper lumbar back pain. He states his pain started yesterday. No known injury to cause this although he does relate that he was pushing heavy furniture around yesterday. Denies any fever, chills, nausea, vomiting, diarrhea, abdominal pain, chest pain. Was initially seen at outside hospital and there was concern for spinal pathology that could be causing his symptoms today. Patient states that overnight he began developing bilateral thigh numbness. States it worsens with lying flat on his back. This improves with sitting up. Denies any prior similar symptoms. He denies any known injury to his back that could've caused this. Denies any urinary retention, loss of bowel control, perianal numbness or tingling. Denies any prior history of back surgery. Denies any prior IV drug use. Does not have any focal area of tenderness along his spine.    Allergies   Sulfa antibiotics and Penicillins   Review of Systems Review of Systems  Constitutional: Negative for chills and fever.  HENT: Negative for congestion and rhinorrhea.   Respiratory: Negative for shortness of breath and wheezing.   Cardiovascular: Negative for chest pain.  Gastrointestinal: Negative for abdominal pain, diarrhea, nausea and vomiting.  Genitourinary: Negative for dysuria and hematuria.  Musculoskeletal: Positive for back pain (as described in HPI). Negative for gait problem.  Skin: Negative for pallor and rash.  Neurological: Positive for numbness (as described in HPI). Negative for weakness.  Psychiatric/Behavioral: Negative  for agitation and confusion.  All other systems reviewed and are negative.    Physical Exam Updated Vital Signs BP 136/87   Pulse 62   Temp 97.9 F (36.6 C) (Oral)   Resp 16   Ht 5\' 11"  (1.803 m)   Wt 92.1 kg   SpO2 99%   BMI 28.31 kg/m   Physical Exam  Constitutional: No distress.  HENT:  Head: Normocephalic and atraumatic.  Right Ear: External ear normal.  Left Ear: External ear normal.  Eyes: Conjunctivae and EOM are normal. Pupils are equal, round, and reactive to light.  Neck: Normal range of motion. Neck supple.  Cardiovascular: Normal rate, regular rhythm and normal heart sounds.   No murmur heard. Pulmonary/Chest: Effort normal and breath sounds normal. No respiratory distress. He has no wheezes. He has no rales.  Abdominal: Soft. He exhibits no distension. There is no tenderness.  Musculoskeletal: Normal range of motion. He exhibits no tenderness (no focal spinal ttp).  Neurological: He has normal strength. No cranial nerve deficit (CN II-XII intact) or sensory deficit. Coordination (nml finger to nose bilaterally) normal. GCS eye subscore is 4. GCS verbal subscore is 5. GCS motor subscore is 6.  Reflex Scores:      Patellar reflexes are 3+ on the right side and 3+ on the left side.      Achilles reflexes are 1+ on the right side and 1+ on the left side. Negative straight leg raise bilaterally  Skin: He is not diaphoretic.  Nursing note and vitals reviewed.    ED Treatments / Results  Labs (all labs ordered are listed, but only abnormal results are displayed) Labs Reviewed  CBC  WITH DIFFERENTIAL/PLATELET - Abnormal; Notable for the following:       Result Value   RBC 3.96 (*)    Hemoglobin 12.5 (*)    HCT 36.6 (*)    All other components within normal limits  BASIC METABOLIC PANEL - Abnormal; Notable for the following:    Glucose, Bld 103 (*)    All other components within normal limits  TROPONIN I    EKG  EKG Interpretation  Date/Time:  Sunday  December 22 2015 13:15:45 EST Ventricular Rate:  57 PR Interval:    QRS Duration: 99 QT Interval:  409 QTC Calculation: 399 R Axis:   28 Text Interpretation:  Sinus rhythm Abnormal inferior Q waves Baseline wander in lead(s) V4 No significant change was found Confirmed by CAMPOS  MD, Lennette Bihari ([id number]) on 12/22/2015 2:03:55 PM Also confirmed by Venora Maples  MD, Lennette Bihari ([id number]), editor [place] CT, Leda Gauze [phone])  on 12/22/2015 2:21:02 PM       Radiology Ct Thoracic Spine Wo Contrast  Result Date: 12/22/2015 CLINICAL DATA:  No known injury. Woke up with severe pain and limited range of motion in the mid thoracic spine. EXAM: CT THORACIC SPINE WITHOUT CONTRAST TECHNIQUE: Multidetector CT images of the thoracic were obtained using the standard protocol without intravenous contrast. COMPARISON:  Chest x-ray 03/04/2009 FINDINGS: Alignment: Normal. Vertebrae: No vertebral fractures. No suspicious lytic or blastic lesions are identified. Paraspinal and other soft tissues: Unremarkable. Disc levels: Mild degenerative changes are identified, most notably at C3-4, T4-5, T7-8. Lateral osteophytes are identified at T6-7 and T8-9. The visualized portion of the thyroid gland has a normal appearance. Coronary artery calcification noted. Small sliding-type hiatal hernia is present. No suspicious pulmonary abnormalities are identified on the limited imaging of the lungs. Imaging of the upper abdomen is unremarkable. IMPRESSION: Mild degenerative changes in the thoracic spine. No evidence for acute  abnormality. Electronically Signed   By: Nolon Nations M.D.   On: 12/22/2015 19:44   Mr Lumbar Spine Wo Contrast  Result Date: 12/22/2015 CLINICAL DATA:  Bilateral leg numbness when sitting and lying. mid back pain. Lifting injury 2 days ago. EXAM: MRI LUMBAR SPINE WITHOUT CONTRAST TECHNIQUE: Multiplanar, multisequence MR imaging of the lumbar spine was performed. No intravenous contrast was administered. COMPARISON:   Abdominopelvic CT 09/16/2015. FINDINGS: Patient was scheduled for a thoracic study as well. Patient was not able to complete the thoracic study which was canceled. Segmentation: Conventional anatomy assumed, with the last open disc space designated L5-S1. Alignment:  Normal. Vertebrae: No worrisome osseous lesion, acute fracture or pars defect. The visualized sacroiliac joints appear unremarkable. Conus medullaris: Extends to the L1 level and appears normal. Paraspinal and other soft tissues: No significant paraspinal findings. Disc levels: No significant disc space findings from T11-12 through L2-3. L3-4: Disc height and hydration are maintained. There is mild disc bulging with endplate osteophytes. No spinal stenosis or nerve root encroachment. L4-5: Disc height and hydration are maintained. Mild disc bulging, facet and ligamentous hypertrophy. No spinal stenosis or nerve root encroachment. L5-S1: Disc desiccation with mild loss of height and mild disc bulging. Mild facet hypertrophy. No spinal stenosis or nerve root encroachment. IMPRESSION: 1. No acute findings. 2. Mild lower lumbar spondylosis. No spinal stenosis or nerve root encroachment. 3. Patient unable to complete thoracic study. Electronically Signed   By: Richardean Sale M.D.   On: 12/22/2015 18:22    Procedures Procedures (including critical care time)  Medications Ordered in ED Medications  oxyCODONE-acetaminophen (PERCOCET/ROXICET) 5-325 MG per tablet  2 tablet (2 tablets Oral Given 12/22/15 1312)  diazepam (VALIUM) tablet 5 mg (5 mg Oral Given 12/22/15 1312)  haloperidol lactate (HALDOL) injection 2 mg (2 mg Intravenous Given 12/22/15 1625)     Initial Impression / Assessment and Plan / ED Course  I have reviewed the triage vital signs and the nursing notes.  Pertinent labs & imaging results that were available during my care of the patient were reviewed by me and considered in my medical decision making (see chart for  details).  Clinical Course as of Dec 22 2206  Sun Dec 22, 2015  1500 MR LUMBAR SPINE WO CONTRAST [VN]    Clinical Course User Index [VN] Theodosia Quay, MD      This is a 55 year old male who presents today as a transfer from local hospital with complaint as described above. We were able to obtain the MRI of his lumbar spine which did not show any acute findings. His CT scan did not demonstrate anything obviously abnormal findings as well. He is neurologically intact. Denies any. No numbness or tingling, also bowel or bladder control. We will give him number to contact to get established with neurologist for further evaluation of this periodic numbness that he sat in his bilateral lower extremities. Patient is agreeable with this. For pain control is provided with a short course of Norco to take home. He is discharged home in good condition.  Final Clinical Impressions(s) / ED Diagnoses   Final diagnoses:  Acute midline thoracic back pain  Bilateral leg numbness  Back pain    New Prescriptions Discharge Medication List as of 12/22/2015  9:04 PM    START taking these medications   Details  HYDROcodone-acetaminophen (NORCO) 5-325 MG tablet Take 1 tablet by mouth every 6 (six) hours as needed for moderate pain., Starting Sun 12/22/2015, Print         Theodosia Quay, MD [id number]    Delora Fuel, MD [id number]

## 2015-12-22 NOTE — ED Provider Notes (Signed)
[facility] DEPT [facility] Provider Note   CSN: [id number] Arrival date & time: 12/22/15  1211     History   Chief Complaint Chief Complaint  Patient presents with  . Back Pain    bilateral lumbar    HPI Sean Alexander is a 55 y.o. male.  HPI Patient presents to the emergency department new mid back pain since yesterday.  He states that he had a poor night sleep because of the back pain and reports he is developing new numbness and tingling radiating down his bilateral legs.  He took some hydrocodone earlier today which did seem to improve his symptoms somewhat.  He denies new weakness of his legs and is still able to ambulate.  He states when he lays down his back pain worsens in his numbness and tingling worsens and then when he stands up his symptoms improved some.  Never had symptoms like this before.  His symptoms are severe in severity.  Denies history of cancer.  No history of recent surgery or dental procedures.  No use of anticoagulants.  No recent injury or trauma.  No heavy lifting.  Patient does have a history kidney stones reports this feels different.  Denies chest pain.  No shortness of breath.  Denies abdominal pain.   Past Medical History:  Diagnosis Date  . Depression   . Hepatitis A virus infection   . Kidney stones   . Sleep apnea    uses CPAP    Patient Active Problem List   Diagnosis Date Noted  . OSA on CPAP 11/23/2014  . Depression 01/17/2014  . Evaluate for Obstructive sleep apnea 01/17/2014  . Hypersomnolence 01/17/2014  . Anxiety 01/17/2014    Past Surgical History:  Procedure Laterality Date  . HERNIA REPAIR  1986       Home Medications    Prior to Admission medications   Medication Sig Start Date End Date Taking? Authorizing Provider  Cranberry 500 MG CHEW Chew 1 Units by mouth daily.   Yes Historical Provider, MD  Multiple Vitamins-Minerals (CENTRUM ADULTS) TABS Take 1 tablet by mouth daily.   Yes Historical Provider, MD    oxyCODONE-acetaminophen (PERCOCET/ROXICET) 5-325 MG tablet Take 1-2 tablets by mouth every 4 (four) hours as needed. 10/19/15  Yes Daryl F de Villier II, PA  venlafaxine XR (EFFEXOR-XR) 75 MG 24 hr capsule Take 75 mg by mouth daily. 08/31/15  Yes Historical Provider, MD  ALPRAZolam Duanne Moron) 0.5 MG tablet Take 1 tablet by mouth as needed for anxiety.  01/10/14   Historical Provider, MD  ondansetron (ZOFRAN ODT) 4 MG disintegrating tablet Take 1 tablet (4 mg total) by mouth every 8 (eight) hours as needed for nausea or vomiting. 09/16/15   Nona Dell, PA-C    Family History Family History  Problem Relation Age of Onset  . Heart disease Mother   . Heart disease Father   . Diabetes Brother   . Colon cancer Neg Hx   . Colon polyps Neg Hx     Social History Social History  Substance Use Topics  . Smoking status: Never Smoker  . Smokeless tobacco: Never Used  . Alcohol use No     Allergies   Sulfa antibiotics and Penicillins   Review of Systems Review of Systems  All other systems reviewed and are negative.    Physical Exam Updated Vital Signs BP 131/93 (BP Location: Left Arm)   Pulse 65   Temp 97.6 F (36.4 C) (Oral)   Resp 18  Ht 5\' 11"  (1.803 m)   Wt 203 lb (92.1 kg)   SpO2 100%   BMI 28.31 kg/m   Physical Exam  Constitutional: He is oriented to person, place, and time. He appears well-developed and well-nourished.  HENT:  Head: Normocephalic and atraumatic.  Eyes: EOM are normal.  Neck: Normal range of motion.  Cardiovascular: Normal rate, regular rhythm and normal heart sounds.   Pulmonary/Chest: Effort normal and breath sounds normal. No respiratory distress.  Abdominal: Soft. He exhibits no distension. There is no tenderness.  Musculoskeletal:  Mild thoracic and parathoracic tenderness without obvious spasm.  No lumbar tenderness.  5 out of 5 strength in bilateral lower extremity major muscle groups.  Normal patellar reflexes bilaterally.   Neurological: He is alert and oriented to person, place, and time.  Skin: Skin is warm and dry.  Psychiatric: He has a normal mood and affect. Judgment normal.  Nursing note and vitals reviewed.    ED Treatments / Results  Labs (all labs ordered are listed, but only abnormal results are displayed) Labs Reviewed  CBC WITH DIFFERENTIAL/PLATELET - Abnormal; Notable for the following:       Result Value   RBC 3.96 (*)    Hemoglobin 12.5 (*)    HCT 36.6 (*)    All other components within normal limits  BASIC METABOLIC PANEL - Abnormal; Notable for the following:    Glucose, Bld 103 (*)    All other components within normal limits  TROPONIN I    EKG  EKG Interpretation  Date/Time:  Sunday December 22 2015 13:15:45 EST Ventricular Rate:  57 PR Interval:    QRS Duration: 99 QT Interval:  409 QTC Calculation: 399 R Axis:   28 Text Interpretation:  Sinus rhythm Abnormal inferior Q waves Baseline wander in lead(s) V4 No significant change was found Confirmed by Treyson Axel  MD, Elleigh Cassetta ([id number]) on 12/22/2015 2:03:55 PM       Radiology No results found.  Procedures Procedures (including critical care time)  Medications Ordered in ED Medications  oxyCODONE-acetaminophen (PERCOCET/ROXICET) 5-325 MG per tablet 2 tablet (2 tablets Oral Given 12/22/15 1312)  diazepam (VALIUM) tablet 5 mg (5 mg Oral Given 12/22/15 1312)     Initial Impression / Assessment and Plan / ED Course  I have reviewed the triage vital signs and the nursing notes.  Pertinent labs & imaging results that were available during my care of the patient were reviewed by me and considered in my medical decision making (see chart for details).  Clinical Course   Doubt ACS.  Doubt thoracic aortic issue.  Given the patient's due symptoms of bilateral lower extremity numbness and tingling in new back pain.  The patient benefit from MRI.  There is no MRI capabilities here at this emergency department today.  I have talked  with Dr. David Stall at [facility] who accepts the patient in transfer for MRI of his thoracic and lumbar spine.  Overall the patient is well-appearing.  He'll be treated with Percocet and Valium here and will be taken directly to [facility] as a transfer via private vehicle  [place] charge RN notified of transfer  Final Clinical Impressions(s) / ED Diagnoses   Final diagnoses:  Acute midline thoracic back pain  Bilateral leg numbness  Back pain    New Prescriptions New Prescriptions   No medications on file     Jola Schmidt, MD 12/22/15 1404

## 2015-12-22 NOTE — ED Notes (Signed)
Pt dc ambulatory instructed on follow up plan and med use NAD AT DC

## 2015-12-22 NOTE — ED Notes (Signed)
Pt returned from MRI. Pt alert, oriented. Spouse in room w/pt.

## 2015-12-22 NOTE — ED Notes (Addendum)
Pt arrived to C[facility] via w/c from pov from [facility]. Here for MRI of thoracic and lumbar. States experiences bil leg numbness when sitting or lying. States also experiences mid back pain. States feels more comfortable standing. States began at approx 1600 yesterday. States lifted heavy furniture x 2 days ago but did not think he had injured himself. Denies any difficulty urinating/ w/BM. Pt ambulatory in room. Pt changed into gown. Pt alert, oriented, cooperative.

## 2015-12-22 NOTE — ED Notes (Signed)
Pt and spouse voiced understanding of tx plan - CT scan.

## 2015-12-24 ENCOUNTER — Encounter (HOSPITAL_COMMUNITY): Payer: Self-pay

## 2015-12-24 ENCOUNTER — Emergency Department (HOSPITAL_COMMUNITY)
Admission: EM | Admit: 2015-12-24 | Discharge: 2015-12-24 | Disposition: A | Discharge status: Home / Self Care | Payer: Managed Care, Other (non HMO) | Attending: Emergency Medicine | Admitting: Emergency Medicine

## 2015-12-24 DIAGNOSIS — Z79899 Other long term (current) drug therapy: Secondary | ICD-10-CM | POA: Diagnosis not present

## 2015-12-24 DIAGNOSIS — R202 Paresthesia of skin: Secondary | ICD-10-CM | POA: Diagnosis not present

## 2015-12-24 DIAGNOSIS — M546 Pain in thoracic spine: Secondary | ICD-10-CM | POA: Diagnosis not present

## 2015-12-24 DIAGNOSIS — R200 Anesthesia of skin: Secondary | ICD-10-CM | POA: Diagnosis present

## 2015-12-24 MED ORDER — GABAPENTIN 100 MG PO CAPS: 100.0000 mg | ORAL_CAPSULE | Freq: Three times a day (TID) | ORAL | 0 refills | Status: DC

## 2015-12-24 NOTE — ED Notes (Signed)
Alex, PA at bedside.  

## 2015-12-24 NOTE — ED Notes (Addendum)
Pt has changed into his clothes and has removed all monitors. Pt attempted to leave and was pacing in Pod A asking for the way out. Pt was redirected back to his room. Pt visibly upset and agitated and states he "hasn't seen anyone, how long do you expect me to wait here? No one has come in." This nurse explained to pt that Cristie Hem, [place] has already been in to assess him. Janett Billow, charge RN also stepped in the room to explain the plan of care to pt. Pt ambulatory in the hallway without difficulty.  Security called and pt asked to wait in his room instead of pacing in the hallway due to pt safety and privacy. Pt complied and returned to room.

## 2015-12-24 NOTE — ED Triage Notes (Signed)
Pt is coming from home with complaints of bilateral leg numbness and tingling that worsens when lying down or sitting for too long. Pt was seen here on Sunday. Pt reports not getting better, and being unable to sleep.

## 2015-12-24 NOTE — ED Notes (Signed)
Pt left grey zip up hooded jacket in room. Placed in belonging bag with pt label, will attempt to call pt. Will place in pt belonging storage room behind pod D.

## 2015-12-24 NOTE — ED Provider Notes (Signed)
[facility] DEPT Provider Note   CSN: [id number] Arrival date & time: 12/24/15  1035     History   Chief Complaint Chief Complaint  Patient presents with  . Extremity Weakness    HPI Sean Alexander is a 55 y.o. male with history of kidney stones, depression who presents with a 2 day history of episodic numbness and paresthesias to bilateral lower extremities. Patient reports he woke up with severe back pain and above symptoms 2 days ago. Patient was seen here and had an MRI lumbar and CT thoracic without any abnormal findings. Patient reports that he no longer has significant back pain, however he continues to have numbness that has been keeping him awake at night. Patient reports his symptoms are mostly present when he is laying down or sitting. Patient reports that when he gets up to walk, his symptoms resolved within around 5 minutes. Patient denies any fevers, weight loss, known cancer, recent surgeries, bowel/bladder incontinence, saddle anesthesia, history of IVDU, chest pain, shortness of breath, abdominal pain, nausea, vomiting, urinary symptoms. Patient reports that he normally has a bowel movement 2-3 times a week, but has not had a bowel movement in 5-6 days.  HPI  Past Medical History:  Diagnosis Date  . Depression   . Hepatitis A virus infection   . Kidney Lef[place]owitz 2017  . Kidney stones   . Sleep apnea    uses CPAP    Patient Active Problem List   Diagnosis Date Noted  . OSA on CPAP 11/23/2014  . Depression 01/17/2014  . Evaluate for Obstructive sleep apnea 01/17/2014  . Hypersomnolence 01/17/2014  . Anxiety 01/17/2014    Past Surgical History:  Procedure Laterality Date  . HERNIA REPAIR  1986       Home Medications    Prior to Admission medications   Medication Sig Start Date End Date Taking? Authorizing Provider  ALPRAZolam Duanne Moron) 0.5 MG tablet Take 1 tablet by mouth as needed for anxiety.  01/10/14   Historical Provider, MD  Cranberry 500 MG CHEW Chew  1 Units by mouth daily.    Historical Provider, MD  gabapentin (NEURONTIN) 100 MG capsule Take 1 capsule (100 mg total) by mouth 3 (three) times daily. 12/24/15   Frederica Kuster, PA-C  HYDROcodone-acetaminophen (NORCO) 5-325 MG tablet Take 1 tablet by mouth every 6 (six) hours as needed for moderate pain. 12/22/15   Theodosia Quay, MD  Multiple Vitamins-Minerals (CENTRUM ADULTS) TABS Take 1 tablet by mouth daily.    Historical Provider, MD  ondansetron (ZOFRAN ODT) 4 MG disintegrating tablet Take 1 tablet (4 mg total) by mouth every 8 (eight) hours as needed for nausea or vomiting. 09/16/15   Nona Dell, PA-C  oxyCODONE-acetaminophen (PERCOCET/ROXICET) 5-325 MG tablet Take 1-2 tablets by mouth every 4 (four) hours as needed. 10/19/15   Daryl F de Villier II, PA  venlafaxine XR (EFFEXOR-XR) 75 MG 24 hr capsule Take 75 mg by mouth daily. 08/31/15   Historical Provider, MD    Family History Family History  Problem Relation Age of Onset  . Heart disease Mother   . Heart disease Father   . Diabetes Brother   . Colon cancer Neg Hx   . Colon polyps Neg Hx     Social History Social History  Substance Use Topics  . Smoking status: Never Smoker  . Smokeless tobacco: Never Used  . Alcohol use No     Allergies   Sulfa antibiotics and Penicillins   Review of Systems  Review of Systems  Constitutional: Negative for chills and fever.  HENT: Negative for facial swelling and sore throat.   Respiratory: Negative for shortness of breath.   Cardiovascular: Negative for chest pain.  Gastrointestinal: Positive for constipation. Negative for abdominal pain, nausea and vomiting.  Genitourinary: Negative for difficulty urinating, dysuria and frequency.  Musculoskeletal: Negative for back pain.  Skin: Negative for rash and wound.  Neurological: Positive for numbness (bilateral LEs). Negative for headaches.  Psychiatric/Behavioral: The patient is not nervous/anxious.      Physical  Exam Updated Vital Signs BP 130/71   Pulse 61   Temp 97.3 F (36.3 C) (Oral)   Resp 12   Ht 5\' 11"  (1.803 m)   Wt 92.1 kg   SpO2 99%   BMI 28.31 kg/m   Physical Exam  Constitutional: He appears well-developed and well-nourished. No distress.  HENT:  Head: Normocephalic and atraumatic.  Mouth/Throat: Oropharynx is clear and moist. No oropharyngeal exudate.  Eyes: Conjunctivae are normal. Pupils are equal, round, and reactive to light. Right eye exhibits no discharge. Left eye exhibits no discharge. No scleral icterus.  Neck: Normal range of motion. Neck supple. No thyromegaly present.  Cardiovascular: Normal rate, regular rhythm, normal heart sounds and intact distal pulses.  Exam reveals no gallop and no friction rub.   No murmur heard. Pulmonary/Chest: Effort normal and breath sounds normal. No stridor. No respiratory distress. He has no wheezes. He has no rales.  Abdominal: Soft. Bowel sounds are normal. He exhibits no distension. There is no tenderness. There is no rebound and no guarding.  Musculoskeletal: He exhibits no edema.       Lumbar back: He exhibits no tenderness and no bony tenderness.       Back:  Lymphadenopathy:    He has no cervical adenopathy.  Neurological: He is alert. Coordination normal.  Reflex Scores:      Patellar reflexes are 2+ on the right side and 2+ on the left side.      Achilles reflexes are 2+ on the right side and 2+ on the left side. Normal sensation to lower extremities, 5/5 strength, patient can heel raise and toe raise without difficulty  Skin: Skin is warm and dry. No rash noted. He is not diaphoretic. No pallor.  Psychiatric: He has a normal mood and affect.  Nursing note and vitals reviewed.    ED Treatments / Results  Labs (all labs ordered are listed, but only abnormal results are displayed) Labs Reviewed - No data to display  EKG  EKG Interpretation None       Radiology Ct Thoracic Spine Wo Contrast  Result Date:  12/22/2015 CLINICAL DATA:  No known injury. Woke up with severe pain and limited range of motion in the mid thoracic spine. EXAM: CT THORACIC SPINE WITHOUT CONTRAST TECHNIQUE: Multidetector CT images of the thoracic were obtained using the standard protocol without intravenous contrast. COMPARISON:  Chest x-ray 03/04/2009 FINDINGS: Alignment: Normal. Vertebrae: No vertebral fractures. No suspicious lytic or blastic lesions are identified. Paraspinal and other soft tissues: Unremarkable. Disc levels: Mild degenerative changes are identified, most notably at C3-4, T4-5, T7-8. Lateral osteophytes are identified at T6-7 and T8-9. The visualized portion of the thyroid gland has a normal appearance. Coronary artery calcification noted. Small sliding-type hiatal hernia is present. No suspicious pulmonary abnormalities are identified on the limited imaging of the lungs. Imaging of the upper abdomen is unremarkable. IMPRESSION: Mild degenerative changes in the thoracic spine. No evidence for acute  abnormality.  Electronically Signed   By: Nolon Nations M.D.   On: 12/22/2015 19:44   Mr Lumbar Spine Wo Contrast  Result Date: 12/22/2015 CLINICAL DATA:  Bilateral leg numbness when sitting and lying. mid back pain. Lifting injury 2 days ago. EXAM: MRI LUMBAR SPINE WITHOUT CONTRAST TECHNIQUE: Multiplanar, multisequence MR imaging of the lumbar spine was performed. No intravenous contrast was administered. COMPARISON:  Abdominopelvic CT 09/16/2015. FINDINGS: Patient was scheduled for a thoracic study as well. Patient was not able to complete the thoracic study which was canceled. Segmentation: Conventional anatomy assumed, with the last open disc space designated L5-S1. Alignment:  Normal. Vertebrae: No worrisome osseous lesion, acute fracture or pars defect. The visualized sacroiliac joints appear unremarkable. Conus medullaris: Extends to the L1 level and appears normal. Paraspinal and other soft tissues: No significant  paraspinal findings. Disc levels: No significant disc space findings from T11-12 through L2-3. L3-4: Disc height and hydration are maintained. There is mild disc bulging with endplate osteophytes. No spinal stenosis or nerve root encroachment. L4-5: Disc height and hydration are maintained. Mild disc bulging, facet and ligamentous hypertrophy. No spinal stenosis or nerve root encroachment. L5-S1: Disc desiccation with mild loss of height and mild disc bulging. Mild facet hypertrophy. No spinal stenosis or nerve root encroachment. IMPRESSION: 1. No acute findings. 2. Mild lower lumbar spondylosis. No spinal stenosis or nerve root encroachment. 3. Patient unable to complete thoracic study. Electronically Signed   By: Richardean Sale M.D.   On: 12/22/2015 18:22    Procedures Procedures (including critical care time)  Medications Ordered in ED Medications - No data to display   Initial Impression / Assessment and Plan / ED Course  I have reviewed the triage vital signs and the nursing notes.  Pertinent labs & imaging results that were available during my care of the patient were reviewed by me and considered in my medical decision making (see chart for details).  Clinical Course     Patient returning with same symptoms as evaluated yesterday. Patient reports that his symptoms have kept him awake for the past 3 nights and the Norco is not working. Patient had negative lumbar MRI and thoracic CT. Patient still ambulatory and without depressive symptoms. Normal neuro exam without focal deficits. No bowel/bladder incontinence or saddle anesthesia. Patient pressure it is because neurology cannot see him until February. I called Dr. Orson Gear office who will call the patient today for an appointment this week. I also discharged patient home with Neurontin. Return precautions discussed. Patient understands and agrees with plan. Patient vitals stable throughout ED course and discharged in satisfactory  condition. I discussed patient case with Dr. Dayna Barker who guided the patient's management and agrees with plan.  Final Clinical Impressions(s) / ED Diagnoses   Final diagnoses:  Paresthesia of both lower extremities    New Prescriptions Discharge Medication List as of 12/24/2015  2:12 PM    START taking these medications   Details  gabapentin (NEURONTIN) 100 MG capsule Take 1 capsule (100 mg total) by mouth 3 (three) times daily., Starting Tue 12/24/2015, Print         Frederica Kuster, PA-C 12/24/15 1548    Merrily Pew, MD 12/25/15 [phone]

## 2015-12-24 NOTE — ED Notes (Signed)
Attempted to call pt, left voicemail on cellphone with instructions on how to receive belonging.

## 2015-12-24 NOTE — Discharge Instructions (Signed)
Medications: Neurontin  Treatment: Begin taking Neurontin 3 times daily. You can continue taking Norco or ibuprofen as prescribed over-the-counter for any back pain. You can use moist heat alternating 20 minutes on, 20 minutes off.  Follow-up: You will be called by Dr. Orson Gear office today to schedule an appointment to be seen. If you do not hear from their office, please call before 5pm to follow up. Please return to emergency department if you develop any new or worsening symptoms including bowel or bladder incontinence, numbness in your groin, or any other new or concerning symptoms.

## 2016-01-01 ENCOUNTER — Ambulatory Visit: Payer: Managed Care, Other (non HMO) | Admitting: Neurology

## 2016-09-02 ENCOUNTER — Observation Stay (HOSPITAL_COMMUNITY): Payer: Commercial Managed Care - PPO

## 2016-09-02 ENCOUNTER — Emergency Department (HOSPITAL_BASED_OUTPATIENT_CLINIC_OR_DEPARTMENT_OTHER): Payer: Commercial Managed Care - PPO

## 2016-09-02 ENCOUNTER — Encounter (HOSPITAL_BASED_OUTPATIENT_CLINIC_OR_DEPARTMENT_OTHER): Payer: Self-pay | Admitting: *Deleted

## 2016-09-02 ENCOUNTER — Observation Stay (HOSPITAL_COMMUNITY): Payer: Commercial Managed Care - PPO | Admitting: Anesthesiology

## 2016-09-02 ENCOUNTER — Observation Stay (HOSPITAL_BASED_OUTPATIENT_CLINIC_OR_DEPARTMENT_OTHER)
Admission: EM | Admit: 2016-09-02 | Discharge: 2016-09-04 | Disposition: A | Discharge status: Home / Self Care | Payer: Commercial Managed Care - PPO | Attending: General Surgery | Admitting: General Surgery

## 2016-09-02 ENCOUNTER — Encounter (HOSPITAL_COMMUNITY)
Admission: EM | Disposition: A | Discharge status: Home / Self Care | Payer: Self-pay | Source: Home / Self Care | Attending: Emergency Medicine

## 2016-09-02 DIAGNOSIS — F329 Major depressive disorder, single episode, unspecified: Secondary | ICD-10-CM | POA: Insufficient documentation

## 2016-09-02 DIAGNOSIS — K449 Diaphragmatic hernia without obstruction or gangrene: Secondary | ICD-10-CM | POA: Diagnosis not present

## 2016-09-02 DIAGNOSIS — K8010 Calculus of gallbladder with chronic cholecystitis without obstruction: Principal | ICD-10-CM | POA: Insufficient documentation

## 2016-09-02 DIAGNOSIS — Z880 Allergy status to penicillin: Secondary | ICD-10-CM | POA: Insufficient documentation

## 2016-09-02 DIAGNOSIS — G4733 Obstructive sleep apnea (adult) (pediatric): Secondary | ICD-10-CM | POA: Diagnosis not present

## 2016-09-02 DIAGNOSIS — Z7982 Long term (current) use of aspirin: Secondary | ICD-10-CM | POA: Insufficient documentation

## 2016-09-02 DIAGNOSIS — I700 Atherosclerosis of aorta: Secondary | ICD-10-CM | POA: Diagnosis not present

## 2016-09-02 DIAGNOSIS — Z882 Allergy status to sulfonamides status: Secondary | ICD-10-CM | POA: Insufficient documentation

## 2016-09-02 DIAGNOSIS — Z9989 Dependence on other enabling machines and devices: Secondary | ICD-10-CM | POA: Insufficient documentation

## 2016-09-02 DIAGNOSIS — Z8619 Personal history of other infectious and parasitic diseases: Secondary | ICD-10-CM | POA: Insufficient documentation

## 2016-09-02 DIAGNOSIS — Z8249 Family history of ischemic heart disease and other diseases of the circulatory system: Secondary | ICD-10-CM | POA: Diagnosis not present

## 2016-09-02 DIAGNOSIS — K819 Cholecystitis, unspecified: Secondary | ICD-10-CM

## 2016-09-02 DIAGNOSIS — Z87442 Personal history of urinary calculi: Secondary | ICD-10-CM | POA: Diagnosis not present

## 2016-09-02 DIAGNOSIS — K429 Umbilical hernia without obstruction or gangrene: Secondary | ICD-10-CM | POA: Diagnosis not present

## 2016-09-02 DIAGNOSIS — F419 Anxiety disorder, unspecified: Secondary | ICD-10-CM | POA: Diagnosis not present

## 2016-09-02 DIAGNOSIS — R1011 Right upper quadrant pain: Secondary | ICD-10-CM

## 2016-09-02 DIAGNOSIS — K810 Acute cholecystitis: Secondary | ICD-10-CM

## 2016-09-02 DIAGNOSIS — Z419 Encounter for procedure for purposes other than remedying health state, unspecified: Secondary | ICD-10-CM

## 2016-09-02 DIAGNOSIS — Z79899 Other long term (current) drug therapy: Secondary | ICD-10-CM | POA: Insufficient documentation

## 2016-09-02 HISTORY — PX: CHOLECYSTECTOMY: SHX55

## 2016-09-02 HISTORY — DX: Right upper quadrant pain: R10.11

## 2016-09-02 LAB — COMPREHENSIVE METABOLIC PANEL
ALBUMIN: 4.3 g/dL (ref 3.5–5.0)
ALT: 29 U/L (ref 17–63)
AST: 25 U/L (ref 15–41)
Alkaline Phosphatase: 55 U/L (ref 38–126)
Anion gap: 7 (ref 5–15)
BUN: 16 mg/dL (ref 6–20)
CHLORIDE: 104 mmol/L (ref 101–111)
CO2: 25 mmol/L (ref 22–32)
CREATININE: 0.93 mg/dL (ref 0.61–1.24)
Calcium: 8.8 mg/dL — ABNORMAL LOW (ref 8.9–10.3)
GFR calc Af Amer: >60 mL/min (ref >60)
GFR calc non Af Amer: >60 mL/min (ref >60)
GLUCOSE: 178 mg/dL — AB (ref 65–99)
POTASSIUM: 3.7 mmol/L (ref 3.5–5.1)
Sodium: 136 mmol/L (ref 135–145)
Total Bilirubin: 0.5 mg/dL (ref 0.3–1.2)
Total Protein: 7 g/dL (ref 6.5–8.1)

## 2016-09-02 LAB — CBC WITH DIFFERENTIAL/PLATELET
BASOS PCT: 0 %
Basophils Absolute: 0 10*3/uL (ref 0.0–0.1)
EOS PCT: 0 %
Eosinophils Absolute: 0 10*3/uL (ref 0.0–0.7)
HCT: 35.5 % — ABNORMAL LOW (ref 39.0–52.0)
Hemoglobin: 12.4 g/dL — ABNORMAL LOW (ref 13.0–17.0)
LYMPHS PCT: 15 %
Lymphs Abs: 1.6 10*3/uL (ref 0.7–4.0)
MCH: 32 pg (ref 26.0–34.0)
MCHC: 34.9 g/dL (ref 30.0–36.0)
MCV: 91.5 fL (ref 78.0–100.0)
MONO ABS: 0.8 10*3/uL (ref 0.1–1.0)
Monocytes Relative: 7 %
NEUTROS ABS: 8.2 10*3/uL — AB (ref 1.7–7.7)
Neutrophils Relative %: 78 %
PLATELETS: 171 10*3/uL (ref 150–400)
RBC: 3.88 MIL/uL — AB (ref 4.22–5.81)
RDW: 12.4 % (ref 11.5–15.5)
WBC: 10.6 10*3/uL — AB (ref 4.0–10.5)

## 2016-09-02 LAB — SURGICAL PCR SCREEN
MRSA, PCR: NEGATIVE
Staphylococcus aureus: NEGATIVE

## 2016-09-02 LAB — LIPASE, BLOOD: Lipase: 24 U/L (ref 11–51)

## 2016-09-02 SURGERY — LAPAROSCOPIC CHOLECYSTECTOMY WITH INTRAOPERATIVE CHOLANGIOGRAM
Anesthesia: General

## 2016-09-02 MED ORDER — ONDANSETRON HCL 4 MG/2ML IJ SOLN
INTRAMUSCULAR | Status: AC
  Filled 2016-09-02: qty 2

## 2016-09-02 MED ORDER — FENTANYL CITRATE (PF) 100 MCG/2ML IJ SOLN
INTRAMUSCULAR | Status: DC | PRN
  Administered 2016-09-02 (×5): 50 ug via INTRAVENOUS

## 2016-09-02 MED ORDER — DEXAMETHASONE SODIUM PHOSPHATE 10 MG/ML IJ SOLN
INTRAMUSCULAR | Status: DC | PRN
  Administered 2016-09-02: 10 mg via INTRAVENOUS

## 2016-09-02 MED ORDER — HYDROMORPHONE HCL-NACL 0.5-0.9 MG/ML-% IV SOSY
1.0000 mg | PREFILLED_SYRINGE | INTRAVENOUS | Status: DC | PRN
  Administered 2016-09-03 (×4): 1 mg via INTRAVENOUS
  Filled 2016-09-02 (×4): qty 2

## 2016-09-02 MED ORDER — GLYCOPYRROLATE 0.2 MG/ML IV SOSY
PREFILLED_SYRINGE | INTRAVENOUS | Status: AC
  Filled 2016-09-02: qty 5

## 2016-09-02 MED ORDER — LACTATED RINGERS IR SOLN
Status: DC | PRN
  Administered 2016-09-02: 1000 mL

## 2016-09-02 MED ORDER — ALPRAZOLAM 0.5 MG PO TABS: 0.5000 mg | ORAL_TABLET | Freq: Two times a day (BID) | ORAL | Status: DC | PRN

## 2016-09-02 MED ORDER — ROCURONIUM BROMIDE 10 MG/ML (PF) SYRINGE
PREFILLED_SYRINGE | INTRAVENOUS | Status: DC | PRN
  Administered 2016-09-02: 50 mg via INTRAVENOUS
  Administered 2016-09-02: 20 mg via INTRAVENOUS

## 2016-09-02 MED ORDER — ROCURONIUM BROMIDE 50 MG/5ML IV SOSY
PREFILLED_SYRINGE | INTRAVENOUS | Status: AC
  Filled 2016-09-02: qty 5

## 2016-09-02 MED ORDER — CIPROFLOXACIN IN D5W 400 MG/200ML IV SOLN
400.0000 mg | Freq: Once | INTRAVENOUS | Status: AC
  Administered 2016-09-02: 400 mg via INTRAVENOUS
  Filled 2016-09-02: qty 200

## 2016-09-02 MED ORDER — HYDROMORPHONE HCL-NACL 0.5-0.9 MG/ML-% IV SOSY: 1.0000 mg | PREFILLED_SYRINGE | INTRAVENOUS | Status: DC | PRN

## 2016-09-02 MED ORDER — PROPOFOL 10 MG/ML IV BOLUS
INTRAVENOUS | Status: AC
  Filled 2016-09-02: qty 20

## 2016-09-02 MED ORDER — ONDANSETRON HCL 4 MG/2ML IJ SOLN: 4.0000 mg | Freq: Three times a day (TID) | INTRAMUSCULAR | Status: AC | PRN

## 2016-09-02 MED ORDER — OXYCODONE HCL 5 MG PO TABS: 5.0000 mg | ORAL_TABLET | Freq: Once | ORAL | Status: DC | PRN

## 2016-09-02 MED ORDER — GLYCOPYRROLATE 0.2 MG/ML IV SOSY
PREFILLED_SYRINGE | INTRAVENOUS | Status: DC | PRN
  Administered 2016-09-02: .2 mg via INTRAVENOUS

## 2016-09-02 MED ORDER — BUPIVACAINE HCL (PF) 0.5 % IJ SOLN
INTRAMUSCULAR | Status: AC
  Filled 2016-09-02: qty 30

## 2016-09-02 MED ORDER — SODIUM CHLORIDE 0.45 % IV SOLN: INTRAVENOUS | Status: DC

## 2016-09-02 MED ORDER — LIDOCAINE 2% (20 MG/ML) 5 ML SYRINGE
INTRAMUSCULAR | Status: AC
  Filled 2016-09-02: qty 5

## 2016-09-02 MED ORDER — PROMETHAZINE HCL 25 MG/ML IJ SOLN: 6.2500 mg | INTRAMUSCULAR | Status: DC | PRN

## 2016-09-02 MED ORDER — SODIUM CHLORIDE 0.9 % IV BOLUS (SEPSIS)
500.0000 mL | Freq: Once | INTRAVENOUS | Status: AC
  Administered 2016-09-02: 500 mL via INTRAVENOUS

## 2016-09-02 MED ORDER — DEXAMETHASONE SODIUM PHOSPHATE 10 MG/ML IJ SOLN
INTRAMUSCULAR | Status: AC
Start: 2016-09-02 — End: ?
  Filled 2016-09-02: qty 1

## 2016-09-02 MED ORDER — HYDROMORPHONE HCL 1 MG/ML IJ SOLN: 1.0000 mg | Freq: Once | INTRAMUSCULAR | Status: DC

## 2016-09-02 MED ORDER — SODIUM CHLORIDE 0.9 % IV SOLN
INTRAVENOUS | Status: DC
  Administered 2016-09-02: 07:00:00 via INTRAVENOUS

## 2016-09-02 MED ORDER — 0.9 % SODIUM CHLORIDE (POUR BTL) OPTIME
TOPICAL | Status: DC | PRN
  Administered 2016-09-02: 1000 mL

## 2016-09-02 MED ORDER — BUPIVACAINE HCL 0.5 % IJ SOLN
INTRAMUSCULAR | Status: DC | PRN
  Administered 2016-09-02: 10 mL

## 2016-09-02 MED ORDER — MIDAZOLAM HCL 2 MG/2ML IJ SOLN
INTRAMUSCULAR | Status: AC
  Filled 2016-09-02: qty 2

## 2016-09-02 MED ORDER — LACTATED RINGERS IV SOLN
INTRAVENOUS | Status: DC
  Administered 2016-09-02: 14:00:00 via INTRAVENOUS

## 2016-09-02 MED ORDER — IOPAMIDOL (ISOVUE-300) INJECTION 61%
100.0000 mL | Freq: Once | INTRAVENOUS | Status: AC | PRN
  Administered 2016-09-02: 100 mL via INTRAVENOUS

## 2016-09-02 MED ORDER — HYDROMORPHONE HCL 1 MG/ML IJ SOLN: 1.0000 mg | INTRAMUSCULAR | Status: DC | PRN

## 2016-09-02 MED ORDER — IOPAMIDOL (ISOVUE-300) INJECTION 61%
INTRAVENOUS | Status: DC | PRN
  Administered 2016-09-02: 2 mL

## 2016-09-02 MED ORDER — SUGAMMADEX SODIUM 200 MG/2ML IV SOLN
INTRAVENOUS | Status: AC
  Filled 2016-09-02: qty 2

## 2016-09-02 MED ORDER — IOPAMIDOL (ISOVUE-300) INJECTION 61%
INTRAVENOUS | Status: AC
  Filled 2016-09-02: qty 50

## 2016-09-02 MED ORDER — HYDROMORPHONE HCL-NACL 0.5-0.9 MG/ML-% IV SOSY: 0.2500 mg | PREFILLED_SYRINGE | INTRAVENOUS | Status: DC | PRN

## 2016-09-02 MED ORDER — SUGAMMADEX SODIUM 200 MG/2ML IV SOLN
INTRAVENOUS | Status: DC | PRN
  Administered 2016-09-02: 200 mg via INTRAVENOUS

## 2016-09-02 MED ORDER — ONDANSETRON HCL 4 MG/2ML IJ SOLN
4.0000 mg | Freq: Once | INTRAMUSCULAR | Status: AC
  Administered 2016-09-02: 4 mg via INTRAVENOUS
  Filled 2016-09-02: qty 2

## 2016-09-02 MED ORDER — PROPOFOL 10 MG/ML IV BOLUS
INTRAVENOUS | Status: DC | PRN
  Administered 2016-09-02: 200 mg via INTRAVENOUS

## 2016-09-02 MED ORDER — OXYCODONE HCL 5 MG/5ML PO SOLN: 5.0000 mg | Freq: Once | ORAL | Status: DC | PRN

## 2016-09-02 MED ORDER — LIDOCAINE 2% (20 MG/ML) 5 ML SYRINGE
INTRAMUSCULAR | Status: DC | PRN
  Administered 2016-09-02: 100 mg via INTRAVENOUS

## 2016-09-02 MED ORDER — MIDAZOLAM HCL 5 MG/5ML IJ SOLN
INTRAMUSCULAR | Status: DC | PRN
  Administered 2016-09-02: 2 mg via INTRAVENOUS

## 2016-09-02 MED ORDER — SODIUM CHLORIDE 0.45 % IV SOLN
INTRAVENOUS | Status: DC
  Administered 2016-09-02 (×2): 125 mL/h via INTRAVENOUS
  Administered 2016-09-03: 03:00:00 via INTRAVENOUS

## 2016-09-02 MED ORDER — DEXTROSE 5 % IV SOLN: 2.0000 g | Freq: Once | INTRAVENOUS | Status: DC

## 2016-09-02 MED ORDER — HYDROMORPHONE HCL-NACL 0.5-0.9 MG/ML-% IV SOSY: 0.5000 mg | PREFILLED_SYRINGE | INTRAVENOUS | Status: AC | PRN

## 2016-09-02 MED ORDER — FENTANYL CITRATE (PF) 250 MCG/5ML IJ SOLN
INTRAMUSCULAR | Status: AC
  Filled 2016-09-02: qty 5

## 2016-09-02 MED ORDER — ONDANSETRON HCL 4 MG/2ML IJ SOLN
INTRAMUSCULAR | Status: DC | PRN
  Administered 2016-09-02: 4 mg via INTRAVENOUS

## 2016-09-02 SURGICAL SUPPLY — 42 items
APL SKNCLS STERI-STRIP NONHPOA (GAUZE/BANDAGES/DRESSINGS) ×1
APPLIER CLIP 5 13 M/L LIGAMAX5 (MISCELLANEOUS) ×3
APR CLP MED LRG 5 ANG JAW (MISCELLANEOUS) ×1
BAG SPEC RTRVL 10 TROC 200 (ENDOMECHANICALS)
BAG SPEC RTRVL LRG 6X4 10 (ENDOMECHANICALS)
BENZOIN TINCTURE PRP APPL 2/3 (GAUZE/BANDAGES/DRESSINGS) ×3 IMPLANT
CABLE HIGH FREQUENCY MONO STRZ (ELECTRODE) ×3 IMPLANT
CATH REDDICK CHOLANGI 4FR 50CM (CATHETERS) ×3 IMPLANT
CHLORAPREP W/TINT 26ML (MISCELLANEOUS) ×3 IMPLANT
CLIP APPLIE 5 13 M/L LIGAMAX5 (MISCELLANEOUS) ×1 IMPLANT
CLOSURE WOUND 1/2 X4 (GAUZE/BANDAGES/DRESSINGS) ×1
COVER MAYO STAND STRL (DRAPES) ×3 IMPLANT
DECANTER SPIKE VIAL GLASS SM (MISCELLANEOUS) ×3 IMPLANT
DISSECTOR BLUNT TIP ENDO 5MM (MISCELLANEOUS) IMPLANT
DRAPE C-ARM 42X120 X-RAY (DRAPES) ×3 IMPLANT
DRSG TEGADERM 2-3/8X2-3/4 SM (GAUZE/BANDAGES/DRESSINGS) ×6 IMPLANT
ELECT REM PT RETURN 15FT ADLT (MISCELLANEOUS) ×3 IMPLANT
ENDOLOOP SUT PDS II  0 18 (SUTURE)
ENDOLOOP SUT PDS II 0 18 (SUTURE) IMPLANT
GAUZE SPONGE 2X2 8PLY STRL LF (GAUZE/BANDAGES/DRESSINGS) ×1 IMPLANT
GLOVE ECLIPSE 8.0 STRL XLNG CF (GLOVE) ×3 IMPLANT
GLOVE INDICATOR 8.0 STRL GRN (GLOVE) ×3 IMPLANT
GOWN STRL REUS W/TWL XL LVL3 (GOWN DISPOSABLE) ×9 IMPLANT
HEMOSTAT SNOW SURGICEL 2X4 (HEMOSTASIS) IMPLANT
IV CATH 14GX2 1/4 (CATHETERS) ×3 IMPLANT
KIT BASIN OR (CUSTOM PROCEDURE TRAY) ×3 IMPLANT
POUCH RETRIEVAL ECOSAC 10 (ENDOMECHANICALS) IMPLANT
POUCH RETRIEVAL ECOSAC 10MM (ENDOMECHANICALS)
POUCH SPECIMEN RETRIEVAL 10MM (ENDOMECHANICALS) IMPLANT
SCISSORS LAP 5X35 DISP (ENDOMECHANICALS) ×3 IMPLANT
SET IRRIG TUBING LAPAROSCOPIC (IRRIGATION / IRRIGATOR) ×3 IMPLANT
SLEEVE XCEL OPT CAN 5 100 (ENDOMECHANICALS) ×8 IMPLANT
SPONGE GAUZE 2X2 STER 10/PKG (GAUZE/BANDAGES/DRESSINGS) ×2
STRIP CLOSURE SKIN 1/2X4 (GAUZE/BANDAGES/DRESSINGS) ×2 IMPLANT
SUT MNCRL AB 4-0 PS2 18 (SUTURE) ×3 IMPLANT
TOWEL OR 17X26 10 PK STRL BLUE (TOWEL DISPOSABLE) ×3 IMPLANT
TOWEL OR NON WOVEN STRL DISP B (DISPOSABLE) IMPLANT
TRAY LAPAROSCOPIC (CUSTOM PROCEDURE TRAY) ×3 IMPLANT
TROCAR BLADELESS OPT 5 100 (ENDOMECHANICALS) ×3 IMPLANT
TROCAR XCEL BLUNT TIP 100MML (ENDOMECHANICALS) ×3 IMPLANT
TROCAR XCEL NON-BLD 11X100MML (ENDOMECHANICALS) IMPLANT
TUBING INSUF HEATED (TUBING) ×3 IMPLANT

## 2016-09-02 NOTE — Discharge Instructions (Addendum)
Please arrive at least 30 min before your appointment to complete your check in paperwork.  If you are unable to arrive 30 min prior to your appointment time we may have to cancel or reschedule you.  [facility] ______[facility] LAPAROSCOPIC CHOLECYSTECTOMY SURGERY: POST OP INSTRUCTIONS Always review your discharge instruction sheet given to you by the facility where your surgery was performed. IF YOU HAVE DISABILITY OR FAMILY LEAVE FORMS, YOU MUST BRING THEM TO THE OFFICE FOR PROCESSING.   DO NOT GIVE THEM TO YOUR DOCTOR.  1. A prescription for pain medication may be given to you upon discharge.  Take your pain medication as prescribed, if needed.  If narcotic pain medicine is not needed, then you may take acetaminophen (Tylenol) or ibuprofen (Advil) as needed. 2. Take your usually prescribed medications unless otherwise directed. 3. If you need a refill on your pain medication, please contact your pharmacy.  They will contact our office to request authorization. Prescriptions will not be filled after 5pm or on week-ends. 4. You should follow a light diet the first few days after arrival home, such as soup and crackers, etc.  Be sure to include lots of fluids daily.  May start lowfat, solid foods 2 days after the surgery. 5. Most patients will experience some swelling and bruising in the area of the incisions.  Ice packs will help.  Swelling and bruising can take several days to resolve.  6. It is common to experience some constipation if taking pain medication after surgery.  Increasing fluid intake and taking a stool softener (such as Colace) will usually help or prevent this problem from occurring.  A mild laxative (Milk of Magnesia or Miralax) should be taken according to package instructions if there are no bowel movements after 48 hours. 7. Unless discharge instructions indicate otherwise, you may remove your bandages 72 hours after surgery.  You may shower the day after surgery.   You may have steri-strips (small skin tapes) in place directly over the incision.  These strips should be left on the skin until they fall off.  If your surgeon used skin glue on the incision, you may shower in 24 hours.  The glue will flake off over the next 2-3 weeks.  Any sutures or staples will be removed at the office during your follow-up visit. 8. ACTIVITIES:  You may resume regular (light) daily activities beginning the next day--such as daily self-care, walking, climbing stairs--gradually increasing activities as tolerated.  You may have sexual intercourse when it is comfortable.  Refrain from any heavy lifting or straining for two weeks.  Do not lift anything over 10 pounds during that time.  a. You may drive when you are no longer taking prescription pain medication, you can comfortably wear a seatbelt, and you can safely maneuver your car and apply brakes. b. RETURN TO WORK:  Desk type work in 1 week, full duty work in 2 weeks if you are pain-free.________________________________________________________ 9. You should see your doctor in the office for a follow-up appointment approximately 2-3 weeks after your surgery.  Make sure that you call for this appointment within a day or two after you arrive home to insure a convenient appointment time. 10. OTHER INSTRUCTIONS: __________________________________________________________________________________________________________________________ __________________________________________________________________________________________________________________________ WHEN TO CALL YOUR DOCTOR: 1. Fever over 101.0 2. Inability to urinate 3. Continued bleeding from incision. 4. Increased pain, redness, or drainage from the incision. 5. Increasing abdominal pain  The clinic staff is available to answer your questions during regular business hours.  Please dont hesitate to call and ask to speak to one of the nurses for clinical concerns.  If you have a  medical emergency, go to the nearest emergency room or call 911.  A surgeon from [facility] is always on call at the hospital. [address], [place], [place], [place]  [address] ? [address] [place], [place], [place]   [address] [phone] ? [phone] ? [phone] Web site: [email or web address]

## 2016-09-02 NOTE — Progress Notes (Signed)
Dr Kieth Brightly notified of patients arrival via his office triage nurse.

## 2016-09-02 NOTE — ED Triage Notes (Signed)
Pt with a feeling of fullness and gas since 2230 last PM. Began having N/V x 90 min PTA. LNBM x 6 days ago.

## 2016-09-02 NOTE — H&P (Signed)
[facility] Admission Note  NEILSON OEHLERT 1960-09-14  [id number].    Requesting MD: Betsey Holiday Chief Complaint/Reason for Consult: Cholelithiasis  HPI:  Sean Alexander is a 56yo male transferred from [facility] to [facility] earlier today with acute onset abdominal pain. Patient states that he ate spaghetti last night for dinner, and then he started having gradually worsening abdominal pain associated with nausea, vomiting, and abdominal bloating. The pain was initially global about his abdomen, then localized to his RUQ. States that he has had similar pain once in the past about 2 months ago, but it resolved on its own. Denies diarrhea, dysuria, fever, or chills.  Work up at [facility] included CT scan which showed moderate gallbladder mural thickening and edema suspicious for acute cholecystitis. Lipase and LFTs WNL, WBC 10.6. Unable to resolve pain in ED, therefore patient was transferred to [facility].  [place] significant for depression Abdominal surgical history: umbilical hernia repair in the 80's Anticoagulants: none Nonsmoker Employment: does inspection for [employer]  ROS: Review of Systems  Constitutional: Negative.   HENT: Negative.   Eyes: Negative.   Respiratory: Negative.   Cardiovascular: Negative.   Gastrointestinal: Positive for abdominal pain, nausea and vomiting. Negative for blood in stool, constipation, diarrhea, heartburn and melena.  Genitourinary: Negative.   Musculoskeletal: Negative.   Skin: Negative.   Neurological: Negative.   All systems reviewed and otherwise negative except for as above  Family History  Problem Relation Age of Onset  . Heart disease Mother   . Heart disease Father   . Diabetes Brother   . Colon cancer Neg Hx   . Colon polyps Neg Hx     Past Medical History:  Diagnosis Date  . Depression   . Hepatitis A virus infection   . Kidney [place] 2017  . Kidney stones   . Sleep apnea    uses CPAP    Past Surgical History:  Procedure  Laterality Date  . HERNIA REPAIR  1986    Social History:  reports that he has never smoked. He has never used smokeless tobacco. He reports that he does not drink alcohol or use drugs.  Allergies:  Allergies  Allergen Reactions  . Sulfa Antibiotics Anaphylaxis    Rash on neck and sides  . Penicillins Rash    Pt is unsure as to his reaction to penicillin     Facility-Administered Medications Prior to Admission  Medication Dose Route Frequency Provider Last Rate Last Dose  . 0.9 %  sodium chloride infusion  500 mL Intravenous Continuous Irene Shipper, MD       Medications Prior to Admission  Medication Sig Dispense Refill  . ALPRAZolam (XANAX) 0.5 MG tablet Take 1 tablet by mouth 2 (two) times daily as needed for anxiety.     Marland Kitchen aspirin 81 MG chewable tablet Chew 81 mg by mouth daily.    . Cranberry 500 MG CHEW Chew 1 Units by mouth daily.    . Multiple Vitamins-Minerals (CENTRUM ADULTS) TABS Take 1 tablet by mouth daily.    Marland Kitchen venlafaxine XR (EFFEXOR-XR) 75 MG 24 hr capsule Take 75 mg by mouth daily.    . ondansetron (ZOFRAN ODT) 4 MG disintegrating tablet Take 1 tablet (4 mg total) by mouth every 8 (eight) hours as needed for nausea or vomiting. 10 tablet 0    Prior to Admission medications   Medication Sig Start Date End Date Taking? Authorizing Provider  ALPRAZolam Duanne Moron) 0.5 MG tablet Take 1 tablet by  mouth 2 (two) times daily as needed for anxiety.  01/10/14  Yes [provider]  aspirin 81 MG chewable tablet Chew 81 mg by mouth daily.   Yes [provider]  Cranberry 500 MG CHEW Chew 1 Units by mouth daily.   Yes [provider]  Multiple Vitamins-Minerals (CENTRUM ADULTS) TABS Take 1 tablet by mouth daily.   Yes [provider]  venlafaxine XR (EFFEXOR-XR) 75 MG 24 hr capsule Take 75 mg by mouth daily. 08/31/15  Yes [provider]  ondansetron (ZOFRAN ODT) 4 MG disintegrating tablet Take 1 tablet (4 mg total) by mouth every 8  (eight) hours as needed for nausea or vomiting. 09/16/15   Nona Dell, PA-C    Blood pressure 125/87, pulse (!) 58, temperature 98 F (36.7 C), resp. rate 20, height '5\' 11"'  (1.803 m), weight 212 lb (96.2 kg), SpO2 97 %. Physical Exam: General: pleasant, WD/WN white male who is laying in bed in NAD HEENT: head is normocephalic, atraumatic.  Sclera are noninjected.  Pupils equal and round.  Ears and nose without any masses or lesions.  Mouth is pink and moist. Dentition fair Heart: regular, rate, and rhythm.  No obvious murmurs, gallops, or rubs noted.  Palpable pedal pulses bilaterally Lungs: CTAB, no wheezes, rhonchi, or rales noted.  Respiratory effort nonlabored Abd: well healed infraumbilical incision, soft, ND, +BS, no masses, hernias, or organomegaly. Minimal RUQ tenderness. Negative Murphy sign MS: all 4 extremities are symmetrical with no cyanosis, clubbing, or edema. Skin: warm and dry with no masses, lesions, or rashes Psych: A&Ox3 with an appropriate affect. Neuro: cranial nerves grossly intact, extremity CSM intact bilaterally, normal speech  Results for orders placed or performed during the hospital encounter of 09/02/16 (from the past 48 hour(s))  CBC with Differential/Platelet     Status: Abnormal   Collection Time: 09/02/16  3:40 AM  Result Value Ref Range   WBC 10.6 (H) 4.0 - 10.5 K/uL   RBC 3.88 (L) 4.22 - 5.81 MIL/uL   Hemoglobin 12.4 (L) 13.0 - 17.0 g/dL   HCT 35.5 (L) 39.0 - 52.0 %   MCV 91.5 78.0 - 100.0 fL   MCH 32.0 26.0 - 34.0 pg   MCHC 34.9 30.0 - 36.0 g/dL   RDW 12.4 11.5 - 15.5 %   Platelets 171 150 - 400 K/uL   Neutrophils Relative % 78 %   Neutro Abs 8.2 (H) 1.7 - 7.7 K/uL   Lymphocytes Relative 15 %   Lymphs Abs 1.6 0.7 - 4.0 K/uL   Monocytes Relative 7 %   Monocytes Absolute 0.8 0.1 - 1.0 K/uL   Eosinophils Relative 0 %   Eosinophils Absolute 0.0 0.0 - 0.7 K/uL   Basophils Relative 0 %   Basophils Absolute 0.0 0.0 - 0.1 K/uL   Comprehensive metabolic panel     Status: Abnormal   Collection Time: 09/02/16  3:40 AM  Result Value Ref Range   Sodium 136 135 - 145 mmol/L   Potassium 3.7 3.5 - 5.1 mmol/L   Chloride 104 101 - 111 mmol/L   CO2 25 22 - 32 mmol/L   Glucose, Bld 178 (H) 65 - 99 mg/dL   BUN 16 6 - 20 mg/dL   Creatinine, Ser 0.93 0.61 - 1.24 mg/dL   Calcium 8.8 (L) 8.9 - 10.3 mg/dL   Total Protein 7.0 6.5 - 8.1 g/dL   Albumin 4.3 3.5 - 5.0 g/dL   AST 25 15 - 41 U/L  ALT 29 17 - 63 U/L   Alkaline Phosphatase 55 38 - 126 U/L   Total Bilirubin 0.5 0.3 - 1.2 mg/dL   GFR calc non Af Amer >60 >60 mL/min   GFR calc Af Amer >60 >60 mL/min    Comment: (NOTE) The eGFR has been calculated using the CKD EPI equation. This calculation has not been validated in all clinical situations. eGFR's persistently <60 mL/min signify possible Chronic Kidney Disease.    Anion gap 7 5 - 15  Lipase, blood     Status: None   Collection Time: 09/02/16  3:40 AM  Result Value Ref Range   Lipase 24 11 - 51 U/L   Ct Abdomen Pelvis W Contrast  Result Date: 09/02/2016 CLINICAL DATA:  Abdominal bloating and fullness for 2 days. EXAM: CT ABDOMEN AND PELVIS WITH CONTRAST TECHNIQUE: Multidetector CT imaging of the abdomen and pelvis was performed using the standard protocol following bolus administration of intravenous contrast. CONTRAST:  121m ISOVUE-300 IOPAMIDOL (ISOVUE-300) INJECTION 61% COMPARISON:  Radiographs 09/02/2016, CT 09/16/2015. FINDINGS: Lower chest: No acute abnormality. Hepatobiliary: No focal liver lesions. Moderate gallbladder mural thickening and edema, suspicious for cholecystitis. No bile duct dilatation. Pancreas: Unremarkable. No pancreatic ductal dilatation or surrounding inflammatory changes. Spleen: Normal in size without focal abnormality. Adrenals/Urinary Tract: Adrenal glands are unremarkable. Kidneys are normal, without renal calculi, focal lesion, or hydronephrosis. Bladder is unremarkable. Stomach/Bowel:  Small hiatal hernia. Small bowel is normal. Appendix is normal. Colon is unremarkable. No focal inflammation of bowel. No obstruction. No extraluminal gas. Vascular/Lymphatic: The abdominal aorta is normal in caliber with mild atherosclerotic calcification. No adenopathy in the abdomen or pelvis. Reproductive: Unremarkable Other: No ascites.  Small fat containing umbilical hernia. Musculoskeletal: No significant skeletal lesion. IMPRESSION: 1. Moderate gallbladder mural thickening and edema, suspicious for acute cholecystitis. No bile duct dilatation. 2. Small hiatal hernia. 3. Aortic atherosclerosis. 4. Small fat containing umbilical hernia. Electronically Signed   By: DAndreas NewportM.D.   On: 09/02/2016 06:19   Dg Abd Acute W/chest  Result Date: 09/02/2016 CLINICAL DATA:  Abdominal pain.  Bloating. EXAM: DG ABDOMEN ACUTE W/ 1V CHEST COMPARISON:  CT 09/16/2015 FINDINGS: There is no evidence of dilated bowel loops or free intraperitoneal air. No radiopaque calculi or other significant radiographic abnormality is seen. Heart size and mediastinal contours are within normal limits. Both lungs are clear. IMPRESSION: Negative abdominal radiographs.  No acute cardiopulmonary disease. Electronically Signed   By: DAndreas NewportM.D.   On: 09/02/2016 04:23   Anti-infectives    Start     Dose/Rate Route Frequency Ordered Stop   09/02/16 0645  cefTRIAXone (ROCEPHIN) 2 g in dextrose 5 % 50 mL IVPB  Status:  Discontinued     2 g 100 mL/hr over 30 Minutes Intravenous  Once 09/02/16 0850208/29/18 0644   09/02/16 0645  ciprofloxacin (CIPRO) IVPB 400 mg     400 mg 200 mL/hr over 60 Minutes Intravenous  Once 09/02/16 0774108/29/18 02878       Assessment/Plan Depression - effexor  RUQ pain Possible acute cholecystitis - prior abdominal surgical history: umbilical hernia repair in 80's - acute onset RUQ pain, nausea, vomiting - CT scan showed moderate gallbladder mural thickening and edema suspicious  for acute cholecystitis - Lipase and LFTs WNL, WBC 10.6  ID - cipro x1 dose 8/29 VTE - SCDs FEN - IVF, NPO Foley - none Follow up - TBD  Plan - Abdominal u/s ordered to evaluate for possible acute cholecystitis.  Patient did receive 1 dose of IV cipro in the ED. Continue IVF and NPO. May require lap chole this admission.  Wellington Hampshire, [facility] 09/02/2016, 10:16 AM Pager: [phone] Consults: [phone] Mon-Fri 7:00 am-4:30 pm Sat-Sun 7:00 am-11:30 am

## 2016-09-02 NOTE — Op Note (Signed)
OPERATIVE NOTE-LAPAROSCOPIC CHOLECYSTECTOMY  Preoperative diagnosis:  Acute cholecystitis  Postoperative diagnosis:  Same   Procedure: Laparoscopic cholecystectomy with cholangiogram.  Surgeon: Jackolyn Confer, M.D.  Anesthesia: General  EBL:  100 ml   Indication:   This is a 56 year old male with acute cholecystitis who presents for urgent cholecystectomy.  Technique: He was brought to the operating room, placed supine on the operating table, and a general anesthetic was administered. The hair on the abdominal wall was clipped as was necessary. The abdominal wall was then sterilely prepped and draped.  A timeout was performed.    Local anesthetic (Marcaine) was infiltrated in the subumbilical region. He was placed in slight reverse Trendelenberg position.  A 5 mm incision was made in the right upper quadrant inferior to the costal margin.  Using a 5 mm Optiview trocar laparoscope, access was gained into the peritoneal cavity and a pneumoperitoneum created by insufflation of carbon dioxide gas.  Inspection of the area under the trocar revealed no evidence of bleeding or organ injury.  Three 5 mm trocars were then placed into the abdominal cavity under laparoscopic vision. One in the subumbilical area, one in the right upper quadrant area and one in the epigastrium. The gallbladder was visualized and the fundus was grasped and retracted toward the right shoulder. Acute inflammatory changes were noted.   The infundibulum was mobilized with dissection close to the gallbladder and retracted laterally. The cystic duct was identified and a window was created around it. The cystic artery was also identified and a window was created around it. It was clipped and divided. The critical view was achieved. A clip was placed at the neck of the gallbladder. A small incision was made in the cystic duct. A cholangiocatheter was introduced through the anterior abdominal wall and placed in the cystic duct. A  intraoperative cholangiogram was then performed.  Under real-time fluoroscopy, dilute contrast was injected into the cystic duct.  The common hepatic duct, the right and left hepatic ducts, and the common duct were all visualized. Contrast drained into the duodenum without obvious evidence of any obstructing ductal lesion. The final report is pending the Radiologist's interpretation.  The cholangiocatheter was removed, the cystic duct was clipped 3 times on the biliary side, and then the cystic duct was divided sharply. No bile leak was noted from the cystic duct stump.   Following this the gallbladder was dissected free from the liver using electrocautery. The 5 mm epigastric trocar was up-sized to a 12 mm trocar. The gallbladder was then placed in a retrieval bag and removed from the abdominal cavity through the epigastric incision.  The gallbladder fossa was inspected, irrigated, and bleeding was controlled with electrocautery. Inspection showed that hemostasis was adequate and there was no evidence of bile leak.  The irrigation fluid was evacuated as much as possible.  The epigastric trocar was removed and the fascial defect was closed with 0 Vicryl suture.  The remaining trocars were removed and the pneumoperitoneum was released. The skin incisions were closed with 4-0 Monocryl subcuticular stitches. Steri-Strips and sterile dressings were applied.  The procedure was well-tolerated without any apparent complications. He was taken to the recovery room in satisfactory condition.

## 2016-09-02 NOTE — Anesthesia Preprocedure Evaluation (Addendum)
Anesthesia Evaluation  Patient identified by MRN, date of birth, ID band Patient awake    Reviewed: Allergy & Precautions, NPO status , Patient's Chart, lab work & pertinent test results  Airway Mallampati: III  TM Distance: >3 FB Neck ROM: Full    Dental no notable dental hx.    Pulmonary sleep apnea and Continuous Positive Airway Pressure Ventilation ,    Pulmonary exam normal breath sounds clear to auscultation       Cardiovascular negative cardio ROS Normal cardiovascular exam Rhythm:Regular Rate:Normal  ECG: SR, rate 32  Sees cardiologist   Neuro/Psych Anxiety Depression negative neurological ROS     GI/Hepatic negative GI ROS, (+) Hepatitis -, A  Endo/Other  negative endocrine ROS  Renal/GU negative Renal ROS     Musculoskeletal negative musculoskeletal ROS (+)   Abdominal   Peds  Hematology  (+) anemia ,   Anesthesia Other Findings   Reproductive/Obstetrics                           Anesthesia Physical Anesthesia Plan  ASA: II  Anesthesia Plan: General   Post-op Pain Management:    Induction: Intravenous  PONV Risk Score and Plan: 2 and Ondansetron, Dexamethasone and Midazolam  Airway Management Planned: Oral ETT  Additional Equipment:   Intra-op Plan:   Post-operative Plan: Extubation in OR  Informed Consent: I have reviewed the patients History and Physical, chart, labs and discussed the procedure including the risks, benefits and alternatives for the proposed anesthesia with the patient or authorized representative who has indicated his/her understanding and acceptance.   Dental advisory given  Plan Discussed with: CRNA  Anesthesia Plan Comments:        Anesthesia Quick Evaluation

## 2016-09-02 NOTE — ED Provider Notes (Addendum)
[facility] DEPT [facility] Provider Note   CSN: [id number] Arrival date & time: 09/02/16  0319     History   Chief Complaint Chief Complaint  Patient presents with  . Abdominal Pain  . Constipation    HPI Sean Alexander is a 56 y.o. male.  Patient presents to the emergency department for evaluation of abdominal pain. Patient reports that approximately 1-1/2 hours ago he started feeling bloated and full. Abdomen is distended, he is experiencing cramping pain. Pain is severe, all over. He has had nausea and vomiting. Patient reports that he has had 2 small bowel movements over the last 6 days. He normally only has a bowel movement twice a week, does have some history of constipation requiring laxatives, however.      Past Medical History:  Diagnosis Date  . Depression   . Hepatitis A virus infection   . Kidney Speights 2017  . Kidney stones   . Sleep apnea    uses CPAP    Patient Active Problem List   Diagnosis Date Noted  . OSA on CPAP 11/23/2014  . Depression 01/17/2014  . Evaluate for Obstructive sleep apnea 01/17/2014  . Hypersomnolence 01/17/2014  . Anxiety 01/17/2014    Past Surgical History:  Procedure Laterality Date  . HERNIA REPAIR  1986       Home Medications    Prior to Admission medications   Medication Sig Start Date End Date Taking? Authorizing Provider  ALPRAZolam Duanne Moron) 0.5 MG tablet Take 1 tablet by mouth as needed for anxiety.  01/10/14   [provider]  Cranberry 500 MG CHEW Chew 1 Units by mouth daily.    [provider]  Multiple Vitamins-Minerals (CENTRUM ADULTS) TABS Take 1 tablet by mouth daily.    [provider]  ondansetron (ZOFRAN ODT) 4 MG disintegrating tablet Take 1 tablet (4 mg total) by mouth every 8 (eight) hours as needed for nausea or vomiting. 09/16/15   Nona Dell, PA-C  venlafaxine XR (EFFEXOR-XR) 75 MG 24 hr capsule Take 75 mg by mouth daily. 08/31/15   [provider]     Family History Family History  Problem Relation Age of Onset  . Heart disease Mother   . Heart disease Father   . Diabetes Brother   . Colon cancer Neg Hx   . Colon polyps Neg Hx     Social History Social History  Substance Use Topics  . Smoking status: Never Smoker  . Smokeless tobacco: Never Used  . Alcohol use No     Allergies   Sulfa antibiotics and Penicillins   Review of Systems Review of Systems  Gastrointestinal: Positive for abdominal distention, abdominal pain, nausea and vomiting.  All other systems reviewed and are negative.    Physical Exam Updated Vital Signs BP 125/79 (BP Location: Left Arm)   Pulse 67   Temp 97.6 F (36.4 C) (Oral)   Resp 18   Ht 5\' 11"  (1.803 m)   Wt 96.2 kg (212 lb)   SpO2 98%   BMI 29.57 kg/m   Physical Exam  Constitutional: He is oriented to person, place, and time. He appears well-developed and well-nourished. No distress.  HENT:  Head: Normocephalic and atraumatic.  Right Ear: Hearing normal.  Left Ear: Hearing normal.  Nose: Nose normal.  Mouth/Throat: Oropharynx is clear and moist and mucous membranes are normal.  Eyes: Pupils are equal, round, and reactive to light. Conjunctivae and EOM are normal.  Neck: Normal range of motion. Neck  supple.  Cardiovascular: Regular rhythm, S1 normal and S2 normal.  Exam reveals no gallop and no friction rub.   No murmur heard. Pulmonary/Chest: Effort normal and breath sounds normal. No respiratory distress. He exhibits no tenderness.  Abdominal: Soft. Normal appearance. He exhibits distension. Bowel sounds are decreased. There is no hepatosplenomegaly. There is generalized tenderness. There is no rebound, no guarding, no tenderness at McBurney's point and negative Murphy's sign. No hernia.  Musculoskeletal: Normal range of motion.  Neurological: He is alert and oriented to person, place, and time. He has normal strength. No cranial nerve deficit or sensory deficit. Coordination  normal. GCS eye subscore is 4. GCS verbal subscore is 5. GCS motor subscore is 6.  Skin: Skin is warm, dry and intact. No rash noted. No cyanosis.  Psychiatric: He has a normal mood and affect. His speech is normal and behavior is normal. Thought content normal.  Nursing note and vitals reviewed.    ED Treatments / Results  Labs (all labs ordered are listed, but only abnormal results are displayed) Labs Reviewed  CBC WITH DIFFERENTIAL/PLATELET - Abnormal; Notable for the following:       Result Value   WBC 10.6 (*)    RBC 3.88 (*)    Hemoglobin 12.4 (*)    HCT 35.5 (*)    Neutro Abs 8.2 (*)    All other components within normal limits  COMPREHENSIVE METABOLIC PANEL - Abnormal; Notable for the following:    Glucose, Bld 178 (*)    Calcium 8.8 (*)    All other components within normal limits  LIPASE, BLOOD    EKG  EKG Interpretation None       Radiology Ct Abdomen Pelvis W Contrast  Result Date: 09/02/2016 CLINICAL DATA:  Abdominal bloating and fullness for 2 days. EXAM: CT ABDOMEN AND PELVIS WITH CONTRAST TECHNIQUE: Multidetector CT imaging of the abdomen and pelvis was performed using the standard protocol following bolus administration of intravenous contrast. CONTRAST:  154mL ISOVUE-300 IOPAMIDOL (ISOVUE-300) INJECTION 61% COMPARISON:  Radiographs 09/02/2016, CT 09/16/2015. FINDINGS: Lower chest: No acute abnormality. Hepatobiliary: No focal liver lesions. Moderate gallbladder mural thickening and edema, suspicious for cholecystitis. No bile duct dilatation. Pancreas: Unremarkable. No pancreatic ductal dilatation or surrounding inflammatory changes. Spleen: Normal in size without focal abnormality. Adrenals/Urinary Tract: Adrenal glands are unremarkable. Kidneys are normal, without renal calculi, focal lesion, or hydronephrosis. Bladder is unremarkable. Stomach/Bowel: Small hiatal hernia. Small bowel is normal. Appendix is normal. Colon is unremarkable. No focal inflammation of  bowel. No obstruction. No extraluminal gas. Vascular/Lymphatic: The abdominal aorta is normal in caliber with mild atherosclerotic calcification. No adenopathy in the abdomen or pelvis. Reproductive: Unremarkable Other: No ascites.  Small fat containing umbilical hernia. Musculoskeletal: No significant skeletal lesion. IMPRESSION: 1. Moderate gallbladder mural thickening and edema, suspicious for acute cholecystitis. No bile duct dilatation. 2. Small hiatal hernia. 3. Aortic atherosclerosis. 4. Small fat containing umbilical hernia. Electronically Signed   By: Andreas Newport M.D.   On: 09/02/2016 06:19   Dg Abd Acute W/chest  Result Date: 09/02/2016 CLINICAL DATA:  Abdominal pain.  Bloating. EXAM: DG ABDOMEN ACUTE W/ 1V CHEST COMPARISON:  CT 09/16/2015 FINDINGS: There is no evidence of dilated bowel loops or free intraperitoneal air. No radiopaque calculi or other significant radiographic abnormality is seen. Heart size and mediastinal contours are within normal limits. Both lungs are clear. IMPRESSION: Negative abdominal radiographs.  No acute cardiopulmonary disease. Electronically Signed   By: Andreas Newport M.D.   On: 09/02/2016  04:23    Procedures Procedures (including critical care time)  Medications Ordered in ED Medications  HYDROmorphone (DILAUDID) injection 1 mg (not administered)  sodium chloride 0.9 % bolus 500 mL (0 mLs Intravenous Stopped 09/02/16 0432)  ondansetron (ZOFRAN) injection 4 mg (4 mg Intravenous Given 09/02/16 0349)  iopamidol (ISOVUE-300) 61 % injection 100 mL (100 mLs Intravenous Contrast Given 09/02/16 0547)     Initial Impression / Assessment and Plan / ED Course  I have reviewed the triage vital signs and the nursing notes.  Pertinent labs & imaging results that were available during my care of the patient were reviewed by me and considered in my medical decision making (see chart for details).     Patient presented to the ER for evaluation of abdominal  pain. Patient with nausea, vomiting and diffuse abdominal pain tonight. Initial examination revealed diffuse tenderness that was fairly significant. He was given IV fluids and Zofran, started to feel some improvement. Repeat examination at this point revealed predominantly right upper quadrant tenderness with some guarding. CT scan was performed. This does show edema of the gallbladder suspicious for cholecystitis.  Discussed with Dr. Kieth Brightly, on-call for general surgery. He was able to look at the images and felt that the patient should be sent to [facility] for further evaluation. He will admit the patient to a med-surg bed. Patient given Rocephin 2 g IV prior to transfer.  Addendum: Patient now thinks he might have had a severe allergic reaction to penicillin. Initially it was listed as rash, but he has had some type of anaphylactic reaction to an antibiotic in the past as well. Antibiotic therefore changed to Cipro 400 mg IV.  Final Clinical Impressions(s) / ED Diagnoses   Final diagnoses:  Right upper quadrant abdominal pain  Cholecystitis    New Prescriptions New Prescriptions   No medications on file     Orpah Greek, MD 09/02/16 1194    Orpah Greek, MD 09/02/16 1740    Orpah Greek, MD 09/02/16 [phone]

## 2016-09-02 NOTE — Transfer of Care (Signed)
Immediate Anesthesia Transfer of Care Note  Patient: Sean Alexander  Procedure(s) Performed: Procedure(s): LAPAROSCOPIC CHOLECYSTECTOMY WITH INTRAOPERATIVE CHOLANGIOGRAM (N/A)  Patient Location: PACU  Anesthesia Type:General  Level of Consciousness: awake, alert , oriented and patient cooperative  Airway & Oxygen Therapy: Patient Spontanous Breathing and Patient connected to face mask oxygen  Post-op Assessment: Report given to RN, Post -op Vital signs reviewed and stable and Patient moving all extremities  Post vital signs: Reviewed and stable  Last Vitals:  Vitals:   09/02/16 0926 09/02/16 1325  BP: 125/87 (!) 141/84  Pulse: (!) 58 69  Resp: 20 20  Temp: 36.7 C 36.7 C  SpO2: 97% 97%    Last Pain:  Vitals:   09/02/16 1325  TempSrc: Oral  PainSc:       Patients Stated Pain Goal: 3 (08/81/10 3159)  Complications: No apparent anesthesia complications

## 2016-09-02 NOTE — Anesthesia Procedure Notes (Signed)
Procedure Name: Intubation Date/Time: 09/02/2016 3:21 PM Performed by: Carleene Cooper A Pre-anesthesia Checklist: Patient identified, Emergency Drugs available, Suction available, Patient being monitored and Timeout performed Patient Re-evaluated:Patient Re-evaluated prior to induction Oxygen Delivery Method: Circle system utilized Preoxygenation: Pre-oxygenation with 100% oxygen Induction Type: IV induction Ventilation: Mask ventilation without difficulty Laryngoscope Size: Mac and 4 Grade View: Grade I Tube type: Oral Tube size: 7.5 mm Number of attempts: 1 Airway Equipment and Method: Stylet Placement Confirmation: ETT inserted through vocal cords under direct vision,  positive ETCO2 and breath sounds checked- equal and bilateral Secured at: 21 cm Tube secured with: Tape Dental Injury: Teeth and Oropharynx as per pre-operative assessment

## 2016-09-02 NOTE — ED Notes (Signed)
Patient transported to CT 

## 2016-09-03 LAB — COMPREHENSIVE METABOLIC PANEL
ALBUMIN: 3.6 g/dL (ref 3.5–5.0)
ALK PHOS: 51 U/L (ref 38–126)
ALT: 44 U/L (ref 17–63)
ANION GAP: 7 (ref 5–15)
AST: 39 U/L (ref 15–41)
BUN: 10 mg/dL (ref 6–20)
CO2: 24 mmol/L (ref 22–32)
Calcium: 8.9 mg/dL (ref 8.9–10.3)
Chloride: 105 mmol/L (ref 101–111)
Creatinine, Ser: 0.83 mg/dL (ref 0.61–1.24)
GFR calc non Af Amer: >60 mL/min (ref >60)
GLUCOSE: 127 mg/dL — AB (ref 65–99)
Potassium: 4.1 mmol/L (ref 3.5–5.1)
SODIUM: 136 mmol/L (ref 135–145)
Total Bilirubin: 0.9 mg/dL (ref 0.3–1.2)
Total Protein: 6.5 g/dL (ref 6.5–8.1)

## 2016-09-03 MED ORDER — OXYCODONE HCL 5 MG PO TABS
5.0000 mg | ORAL_TABLET | ORAL | Status: DC | PRN
  Administered 2016-09-04: 5 mg via ORAL
  Filled 2016-09-03: qty 1

## 2016-09-03 MED ORDER — HYDROMORPHONE HCL-NACL 0.5-0.9 MG/ML-% IV SOSY
1.0000 mg | PREFILLED_SYRINGE | Freq: Four times a day (QID) | INTRAVENOUS | Status: DC | PRN
  Administered 2016-09-03: 1 mg via INTRAVENOUS
  Filled 2016-09-03: qty 2

## 2016-09-03 MED ORDER — ACETAMINOPHEN 325 MG PO TABS: 650.0000 mg | ORAL_TABLET | Freq: Four times a day (QID) | ORAL | Status: DC | PRN

## 2016-09-03 MED ORDER — IBUPROFEN 200 MG PO TABS: 400.0000 mg | ORAL_TABLET | Freq: Four times a day (QID) | ORAL | Status: DC | PRN

## 2016-09-03 NOTE — Anesthesia Postprocedure Evaluation (Signed)
Anesthesia Post Note  Patient: Sean Alexander  Procedure(s) Performed: Procedure(s) (LRB): LAPAROSCOPIC CHOLECYSTECTOMY WITH INTRAOPERATIVE CHOLANGIOGRAM (N/A)     Patient location during evaluation: PACU Anesthesia Type: General Level of consciousness: awake and alert Pain management: pain level controlled Vital Signs Assessment: post-procedure vital signs reviewed and stable Respiratory status: spontaneous breathing, nonlabored ventilation, respiratory function stable and patient connected to nasal cannula oxygen Cardiovascular status: blood pressure returned to baseline and stable Postop Assessment: no signs of nausea or vomiting Anesthetic complications: no    Last Vitals:  Vitals:   09/03/16 0214 09/03/16 0512  BP: (!) 150/85 129/81  Pulse: 74 68  Resp: 16 16  Temp: 36.8 C 36.8 C  SpO2: 96% 94%    Last Pain:  Vitals:   09/03/16 0559  TempSrc:   PainSc: Asleep                 Eliazer Hemphill S

## 2016-09-03 NOTE — Progress Notes (Signed)
Assessment Acute cholecystitis s/p lap chole with IOC 09/02/16-stable overnight  Plan:  Decrease IVF.  Ambulate.  Possibly home later today.   LOS: 0 days     1 Day Post-Op  Chief Complaint/Subjective: Sore.  Tolerating liquids.  No n/v.  Objective: Vital signs in last 24 hours: Temp:  [98 F (36.7 C)-98.9 F (37.2 C)] 98.2 F (36.8 C) (08/30 0512) Pulse Rate:  [58-93] 68 (08/30 0512) Resp:  [11-20] 16 (08/30 0512) BP: (125-154)/(74-97) 129/81 (08/30 0512) SpO2:  [94 %-100 %] 94 % (08/30 0512) Last BM Date: 09/02/16 (small amt this amt--last "Normal bm 5 ago")  Intake/Output from previous day: 08/29 0701 - 08/30 0700 In: 3456.3 [P.O.:720; I.V.:2336.3; IV Piggyback:400] Out: 3150 [Urine:3125; Blood:25] Intake/Output this shift: No intake/output data recorded.  PE: General- In NAD.  Awake and alert. Abdomen-soft, dressings dry  Lab Results:   Recent Labs  09/02/16 0340  WBC 10.6*  HGB 12.4*  HCT 35.5*  PLT 171   BMET  Recent Labs  09/02/16 0340 09/03/16 0453  NA 136 136  K 3.7 4.1  CL 104 105  CO2 25 24  GLUCOSE 178* 127*  BUN 16 10  CREATININE 0.93 0.83  CALCIUM 8.8* 8.9   PT/INR No results for input(s): LABPROT, INR in the last 72 hours. Comprehensive Metabolic Panel:    Component Value Date/Time   NA 136 09/03/2016 0453   NA 136 09/02/2016 0340   K 4.1 09/03/2016 0453   K 3.7 09/02/2016 0340   CL 105 09/03/2016 0453   CL 104 09/02/2016 0340   CO2 24 09/03/2016 0453   CO2 25 09/02/2016 0340   BUN 10 09/03/2016 0453   BUN 16 09/02/2016 0340   CREATININE 0.83 09/03/2016 0453   CREATININE 0.93 09/02/2016 0340   GLUCOSE 127 (H) 09/03/2016 0453   GLUCOSE 178 (H) 09/02/2016 0340   CALCIUM 8.9 09/03/2016 0453   CALCIUM 8.8 (L) 09/02/2016 0340   AST 39 09/03/2016 0453   AST 25 09/02/2016 0340   ALT 44 09/03/2016 0453   ALT 29 09/02/2016 0340   ALKPHOS 51 09/03/2016 0453   ALKPHOS 55 09/02/2016 0340   BILITOT 0.9 09/03/2016 0453   BILITOT  0.5 09/02/2016 0340   PROT 6.5 09/03/2016 0453   PROT 7.0 09/02/2016 0340   ALBUMIN 3.6 09/03/2016 0453   ALBUMIN 4.3 09/02/2016 0340     Studies/Results: Dg Cholangiogram Operative  Result Date: 09/02/2016 CLINICAL DATA:  Intraoperative cholangiogram during laparoscopic cholecystectomy. EXAM: INTRAOPERATIVE CHOLANGIOGRAM FLUOROSCOPY TIME:  10 seconds COMPARISON:  Right upper quadrant abdominal ultrasound - 09/02/2016 FINDINGS: Intraoperative cholangiographic images of the right upper abdominal quadrant during laparoscopic cholecystectomy are provided for review. Surgical clips overlie the expected location of the gallbladder fossa. Contrast injection demonstrates selective cannulation of the central aspect of the cystic duct. There is passage of contrast through the central aspect of the cystic duct with filling of a non dilated common bile duct. There is passage of contrast though the CBD and into the descending portion of the duodenum. Note is made of a slightly anomalous caudal insertion of the cystic duct into the common bile duct. There is minimal reflux of injected contrast into the common hepatic duct and central aspect of the non dilated intrahepatic biliary system. There are no discrete filling defects within the opacified portions of the biliary system to suggest the presence of choledocholithiasis. IMPRESSION: No evidence of choledocholithiasis. Electronically Signed   By: Sandi Mariscal M.D.   On: 09/02/2016 17:32  Ct Abdomen Pelvis W Contrast  Result Date: 09/02/2016 CLINICAL DATA:  Abdominal bloating and fullness for 2 days. EXAM: CT ABDOMEN AND PELVIS WITH CONTRAST TECHNIQUE: Multidetector CT imaging of the abdomen and pelvis was performed using the standard protocol following bolus administration of intravenous contrast. CONTRAST:  169mL ISOVUE-300 IOPAMIDOL (ISOVUE-300) INJECTION 61% COMPARISON:  Radiographs 09/02/2016, CT 09/16/2015. FINDINGS: Lower chest: No acute abnormality.  Hepatobiliary: No focal liver lesions. Moderate gallbladder mural thickening and edema, suspicious for cholecystitis. No bile duct dilatation. Pancreas: Unremarkable. No pancreatic ductal dilatation or surrounding inflammatory changes. Spleen: Normal in size without focal abnormality. Adrenals/Urinary Tract: Adrenal glands are unremarkable. Kidneys are normal, without renal calculi, focal lesion, or hydronephrosis. Bladder is unremarkable. Stomach/Bowel: Small hiatal hernia. Small bowel is normal. Appendix is normal. Colon is unremarkable. No focal inflammation of bowel. No obstruction. No extraluminal gas. Vascular/Lymphatic: The abdominal aorta is normal in caliber with mild atherosclerotic calcification. No adenopathy in the abdomen or pelvis. Reproductive: Unremarkable Other: No ascites.  Small fat containing umbilical hernia. Musculoskeletal: No significant skeletal lesion. IMPRESSION: 1. Moderate gallbladder mural thickening and edema, suspicious for acute cholecystitis. No bile duct dilatation. 2. Small hiatal hernia. 3. Aortic atherosclerosis. 4. Small fat containing umbilical hernia. Electronically Signed   By: Andreas Newport M.D.   On: 09/02/2016 06:19   Dg Abd Acute W/chest  Result Date: 09/02/2016 CLINICAL DATA:  Abdominal pain.  Bloating. EXAM: DG ABDOMEN ACUTE W/ 1V CHEST COMPARISON:  CT 09/16/2015 FINDINGS: There is no evidence of dilated bowel loops or free intraperitoneal air. No radiopaque calculi or other significant radiographic abnormality is seen. Heart size and mediastinal contours are within normal limits. Both lungs are clear. IMPRESSION: Negative abdominal radiographs.  No acute cardiopulmonary disease. Electronically Signed   By: Andreas Newport M.D.   On: 09/02/2016 04:23   US Abdomen Limited Ruq  Result Date: 09/02/2016 CLINICAL DATA:  56 year old male with history of abdominal pain for 1 day. Evaluate for potential acute cholecystitis. EXAM: ULTRASOUND ABDOMEN LIMITED RIGHT  UPPER QUADRANT COMPARISON:  No prior.  CT the abdomen and pelvis 09/02/2016. FINDINGS: Gallbladder: 1 cm echogenic focus lying dependently with posterior acoustic shadowing, compatible with a gallstone. Other echogenic amorphous nonshadowing material in the gallbladder, compatible with biliary sludge. Gallbladder is not distended. Gallbladder wall does appear thickened measuring up to 8 mm. Trace amount of pericholecystic fluid. Per report from the sonographer there was no sonographic Murphy's sign on examination. Common bile duct: Diameter: 3.6 mm. Liver: Mild diffuse increased echogenicity throughout the hepatic parenchyma, suggestive of hepatic steatosis. Portal vein is patent on color Doppler imaging with normal direction of blood flow towards the liver. IMPRESSION: 1. Biliary sludge and cholelithiasis in a contracted gallbladder, with thickened edematous wall and trace amount of pericholecystic fluid. No sonographic Murphy's sign on examination. Overall, findings are considered equivocal for an acute cholecystitis. 2. Hepatic steatosis. Electronically Signed   By: Vinnie Langton M.D.   On: 09/02/2016 12:21    Anti-infectives: Anti-infectives    Start     Dose/Rate Route Frequency Ordered Stop   09/02/16 1830  ciprofloxacin (CIPRO) IVPB 400 mg     400 mg 200 mL/hr over 60 Minutes Intravenous  Once 09/02/16 1747 09/02/16 1923   09/02/16 0645  cefTRIAXone (ROCEPHIN) 2 g in dextrose 5 % 50 mL IVPB  Status:  Discontinued     2 g 100 mL/hr over 30 Minutes Intravenous  Once 09/02/16 0637 09/02/16 0644   09/02/16 0645  ciprofloxacin (CIPRO) IVPB 400 mg  400 mg 200 mL/hr over 60 Minutes Intravenous  Once 09/02/16 0644 09/02/16 0807       Sean Alexander 09/03/2016

## 2016-09-04 MED ORDER — OXYCODONE HCL 5 MG PO TABS: 5.0000 mg | ORAL_TABLET | ORAL | 0 refills | Status: DC | PRN

## 2016-09-04 NOTE — Discharge Summary (Signed)
[facility] Discharge Summary   Patient ID: Sean Alexander MRN: [id number] DOB/AGE: 54-Dec-1962 54 y.o.  Admit date: 09/02/2016 Discharge date: 09/04/2016  Admitting Diagnosis: RUQ pain  Discharge Diagnosis Patient Active Problem List   Diagnosis Date Noted  . RUQ pain 09/02/2016  . OSA on CPAP 11/23/2014  . Depression 01/17/2014  . Evaluate for Obstructive sleep apnea 01/17/2014  . Hypersomnolence 01/17/2014  . Anxiety 01/17/2014    Consultants None  Imaging: Dg Cholangiogram Operative  Result Date: 09/02/2016 CLINICAL DATA:  Intraoperative cholangiogram during laparoscopic cholecystectomy. EXAM: INTRAOPERATIVE CHOLANGIOGRAM FLUOROSCOPY TIME:  10 seconds COMPARISON:  Right upper quadrant abdominal ultrasound - 09/02/2016 FINDINGS: Intraoperative cholangiographic images of the right upper abdominal quadrant during laparoscopic cholecystectomy are provided for review. Surgical clips overlie the expected location of the gallbladder fossa. Contrast injection demonstrates selective cannulation of the central aspect of the cystic duct. There is passage of contrast through the central aspect of the cystic duct with filling of a non dilated common bile duct. There is passage of contrast though the CBD and into the descending portion of the duodenum. Note is made of a slightly anomalous caudal insertion of the cystic duct into the common bile duct. There is minimal reflux of injected contrast into the common hepatic duct and central aspect of the non dilated intrahepatic biliary system. There are no discrete filling defects within the opacified portions of the biliary system to suggest the presence of choledocholithiasis. IMPRESSION: No evidence of choledocholithiasis. Electronically Signed   By: Sandi Mariscal M.D.   On: 09/02/2016 17:32   US Abdomen Limited Ruq  Result Date: 09/02/2016 CLINICAL DATA:  56 year old male with history of abdominal pain for 1 day. Evaluate for potential  acute cholecystitis. EXAM: ULTRASOUND ABDOMEN LIMITED RIGHT UPPER QUADRANT COMPARISON:  No prior.  CT the abdomen and pelvis 09/02/2016. FINDINGS: Gallbladder: 1 cm echogenic focus lying dependently with posterior acoustic shadowing, compatible with a gallstone. Other echogenic amorphous nonshadowing material in the gallbladder, compatible with biliary sludge. Gallbladder is not distended. Gallbladder wall does appear thickened measuring up to 8 mm. Trace amount of pericholecystic fluid. Per report from the sonographer there was no sonographic Murphy's sign on examination. Common bile duct: Diameter: 3.6 mm. Liver: Mild diffuse increased echogenicity throughout the hepatic parenchyma, suggestive of hepatic steatosis. Portal vein is patent on color Doppler imaging with normal direction of blood flow towards the liver. IMPRESSION: 1. Biliary sludge and cholelithiasis in a contracted gallbladder, with thickened edematous wall and trace amount of pericholecystic fluid. No sonographic Murphy's sign on examination. Overall, findings are considered equivocal for an acute cholecystitis. 2. Hepatic steatosis. Electronically Signed   By: Vinnie Langton M.D.   On: 09/02/2016 12:21    Procedures Dr. Zella Richer (09/02/16) - Laparoscopic Cholecystectomy with [facility] Hospital Course:  Sean Alexander is a 56yo male who was transferred from [facility] to [facility] 8/29 with acute onset abdominal pain, nausea, and vomiting.  Ultrasound confirmed acute cholecystitis.  Patient was admitted and underwent procedure listed above.  Tolerated procedure well and was transferred to the floor.  Diet was advanced as tolerated.  On POD2 the patient was voiding well, tolerating diet, ambulating well, pain well controlled, vital signs stable, incisions c/d/i and felt stable for discharge home.  Patient will follow up in our office in 2 weeks and knows to call with questions or concerns.  I have personally reviewed the patients medication history on the  [place] controlled substance database.    Physical Exam: General:  Alert, NAD, pleasant, comfortable Pulm: effort normal Cardio: RRR Abd:  Soft, ND, appropriately tender, +BS, multiple lap incisions with clean/dry dressings (one with trace dried blood)  Allergies as of 09/04/2016      Reactions   Sulfa Antibiotics Anaphylaxis   Rash on neck and sides   Penicillins Rash   Has patient had a PCN reaction causing immediate rash, facial/tongue/throat swelling, SOB or lightheadedness with hypotension: No Has patient had a PCN reaction causing severe rash involving mucus membranes or skin necrosis: No Has patient had a PCN reaction that required hospitalization: No Has patient had a PCN reaction occurring within the last 10 years: No If all of the above answers are "NO", then may proceed with Cephalosporin use.      Medication List    TAKE these medications   ALPRAZolam 0.5 MG tablet Commonly known as:  XANAX Take 1 tablet by mouth 2 (two) times daily as needed for anxiety.   aspirin 81 MG chewable tablet Chew 81 mg by mouth daily.   CENTRUM ADULTS Tabs Take 1 tablet by mouth daily.   Cranberry 500 MG Chew Chew 1 Units by mouth daily.   ondansetron 4 MG disintegrating tablet Commonly known as:  ZOFRAN ODT Take 1 tablet (4 mg total) by mouth every 8 (eight) hours as needed for nausea or vomiting.   oxyCODONE 5 MG immediate release tablet Commonly known as:  Oxy IR/ROXICODONE Take 1 tablet (5 mg total) by mouth every 4 (four) hours as needed for moderate pain or severe pain.   venlafaxine XR 75 MG 24 hr capsule Commonly known as:  EFFEXOR-XR Take 75 mg by mouth daily.            Discharge Care Instructions        Start     Ordered   09/04/16 0000  oxyCODONE (OXY IR/ROXICODONE) 5 MG immediate release tablet  Every 4 hours PRN     09/04/16 0902   09/04/16 0000  Discharge patient    Question Answer Comment  Discharge disposition 01-Home or Self Care   Discharge patient  date 09/04/2016      09/04/16 [id number]       [facility]up [facility] Go on 09/15/2016.   Specialty:  General Surgery Why:  Your appointment is 9/11 at 11:30AM. Please arrive 30 minutes prior to your appointment to check in and fill out necessary paperwork. Contact information: [address] [place] [phone]          Signed: Wellington Hampshire, [facility] 09/04/2016, 9:04 AM Pager: [phone] Consults: [phone] Mon-Fri 7:00 am-4:30 pm Sat-Sun 7:00 am-11:30 am

## 2016-09-04 NOTE — Discharge Planning (Signed)
Patient IV removed.  Discharge papers given, explained and educated.  Informed of suggested FU appt and appt made.  Signed pain script given and wk note. RN assessment and VS revealed stability for DC to home.  Once ready and ride arrives - will be wheeled to front and family transporting home via car. (waiting on ride).

## 2016-09-05 LAB — HEMOGLOBIN A1C
HEMOGLOBIN A1C: 5.5 % (ref 4.8–5.6)
MEAN PLASMA GLUCOSE: 111.15 mg/dL

## 2018-02-16 ENCOUNTER — Encounter: Payer: Self-pay | Admitting: Emergency Medicine

## 2018-02-16 DIAGNOSIS — F411 Generalized anxiety disorder: Secondary | ICD-10-CM | POA: Insufficient documentation

## 2018-02-16 DIAGNOSIS — G4700 Insomnia, unspecified: Secondary | ICD-10-CM

## 2018-02-16 DIAGNOSIS — F5104 Psychophysiologic insomnia: Secondary | ICD-10-CM | POA: Insufficient documentation

## 2018-03-26 ENCOUNTER — Other Ambulatory Visit: Payer: Self-pay | Admitting: Physician Assistant

## 2018-03-28 NOTE — Telephone Encounter (Signed)
Last fill 2/23 #30  I don't have access to his paper chart, not seen in epic.  Please review

## 2018-05-27 ENCOUNTER — Other Ambulatory Visit: Payer: Self-pay | Admitting: Physician Assistant

## 2018-05-27 NOTE — Telephone Encounter (Signed)
Has appt 06/10, not sure when he was seen last. Not in epic

## 2018-06-15 ENCOUNTER — Ambulatory Visit: Payer: Self-pay | Admitting: Physician Assistant

## 2018-07-25 ENCOUNTER — Other Ambulatory Visit: Payer: Self-pay | Admitting: Physician Assistant

## 2018-07-26 NOTE — Telephone Encounter (Signed)
Need to check out paper chart not seen in epic

## 2018-10-06 ENCOUNTER — Other Ambulatory Visit: Payer: Self-pay

## 2018-10-06 ENCOUNTER — Telehealth: Payer: Self-pay | Admitting: Physician Assistant

## 2018-10-06 MED ORDER — ALPRAZOLAM 0.5 MG PO TABS: ORAL_TABLET | ORAL | 1 refills | Status: DC

## 2018-10-06 MED ORDER — VENLAFAXINE HCL ER 75 MG PO CP24: 75.0000 mg | ORAL_CAPSULE | Freq: Every day | ORAL | 0 refills | Status: DC

## 2018-10-06 NOTE — Telephone Encounter (Signed)
Pt spouse Mickel Baas called for a refill on [name]'s Alprazolam 0.5 and the Effexor. He has not been seen since last year. Appt was scheduled for 10/30. Fill at the [facility]	[place].

## 2018-10-06 NOTE — Telephone Encounter (Signed)
Refills submitted.  

## 2018-11-01 IMAGING — CT CT T SPINE W/O CM
3 of 4 series · 9 of 33 positions shown, 10 images · non-contrast
Comparison: Chest x-ray 03/04/2009

CLINICAL DATA: No known injury. Woke up with severe pain and
limited range of motion in the mid thoracic spine.

EXAM:
CT THORACIC SPINE WITHOUT CONTRAST
TECHNIQUE: Multidetector CT images of the thoracic were obtained using the
standard protocol without intravenous contrast.

[Series 5: t-spine 2.0 st · axial · 0.35mm/px · z∈[-345,-345]mm · 1 of 199 slices shown, 2 images]
[im 100/199  soft-tissue]
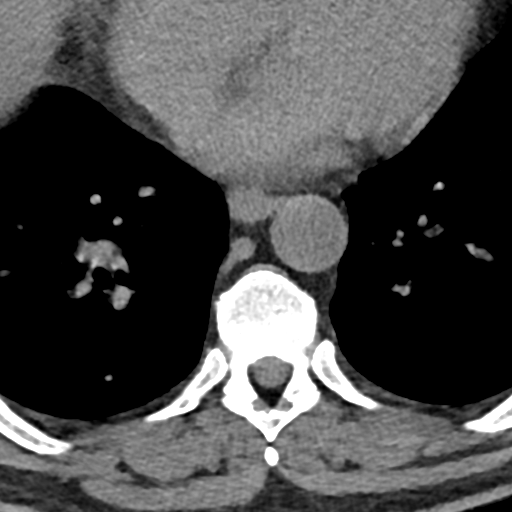
[im 100/199  bone]
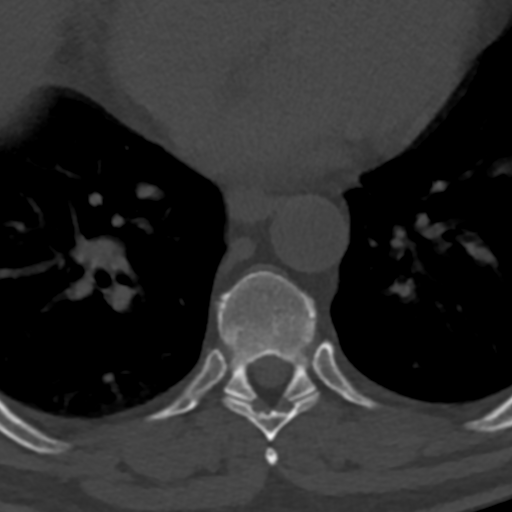

[Series 7: coronal st · coronal · 0.29mm/px · 3 of 69 slices shown]
[im 14/69  bone]
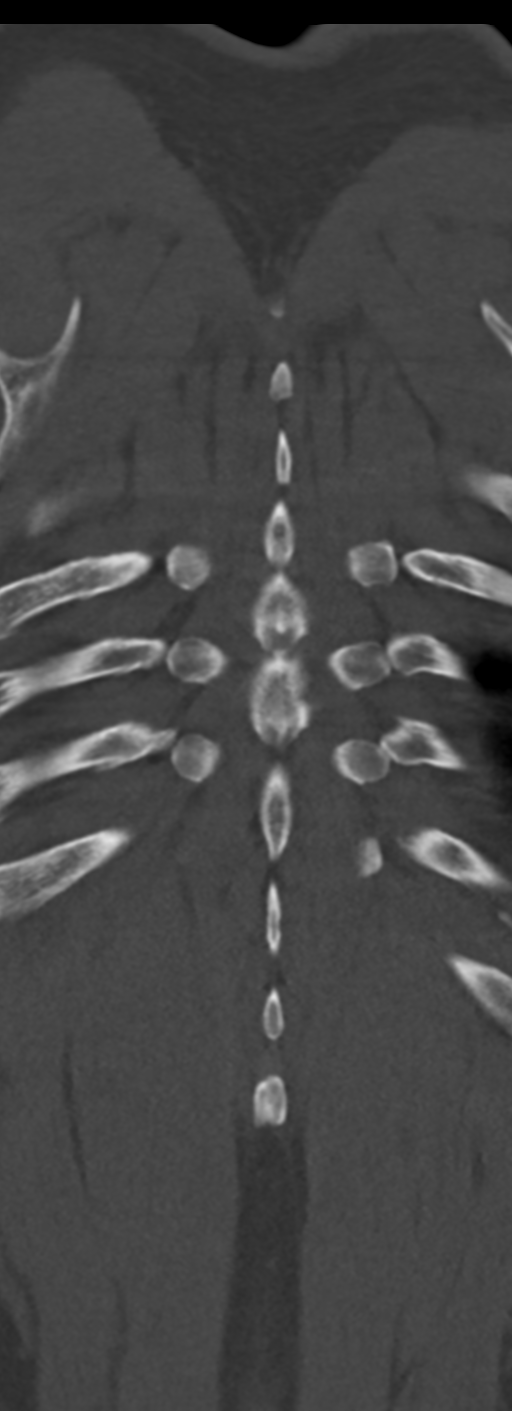
[im 28/69  bone]
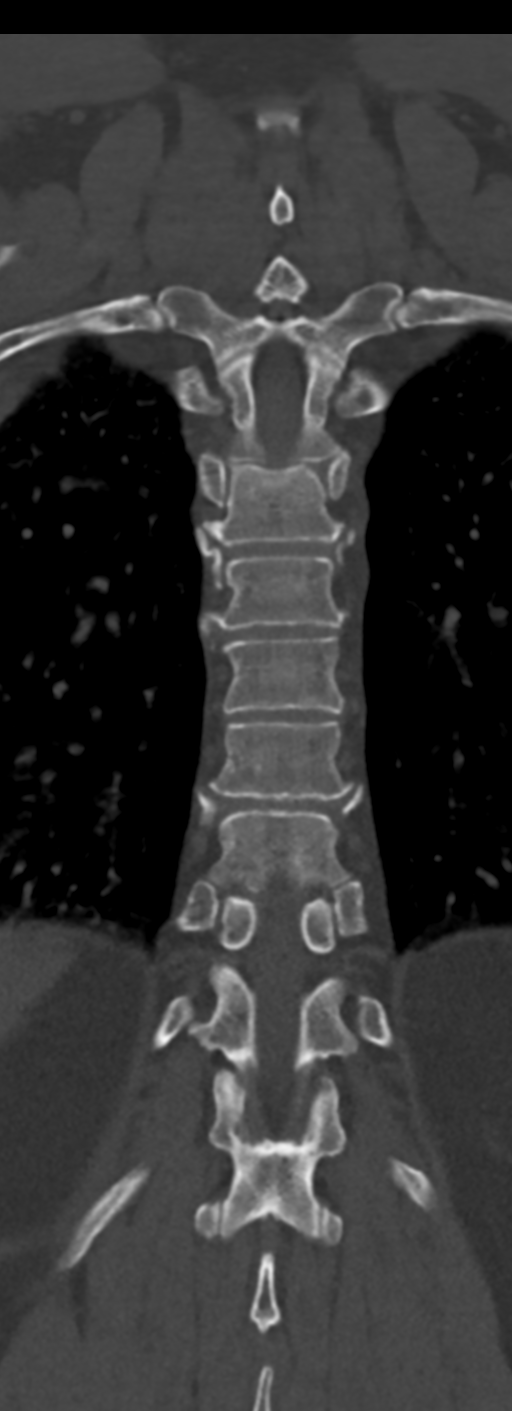
[im 41/69  bone]
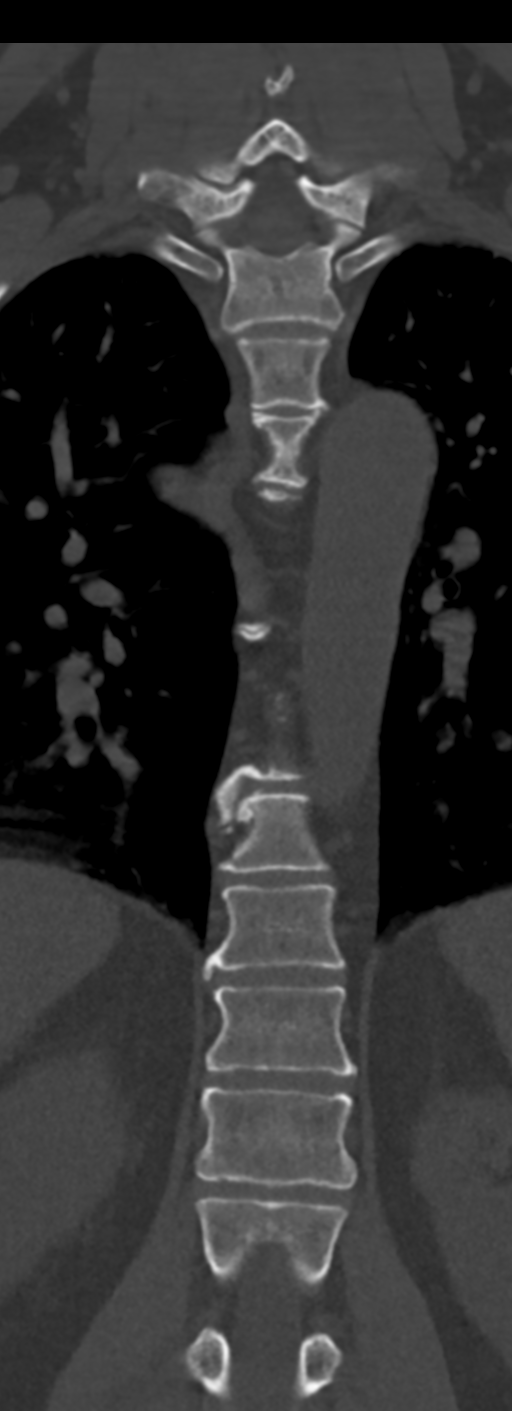

[Series 9: sagittal st · sagittal · 0.40mm/px · 5 of 61 slices shown]
[im 21/61  bone]
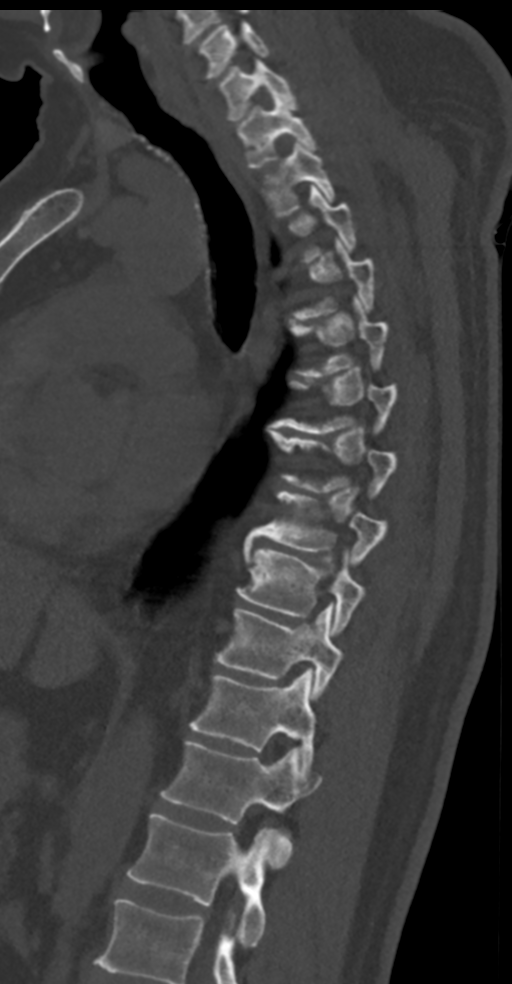
[im 26/61  bone]
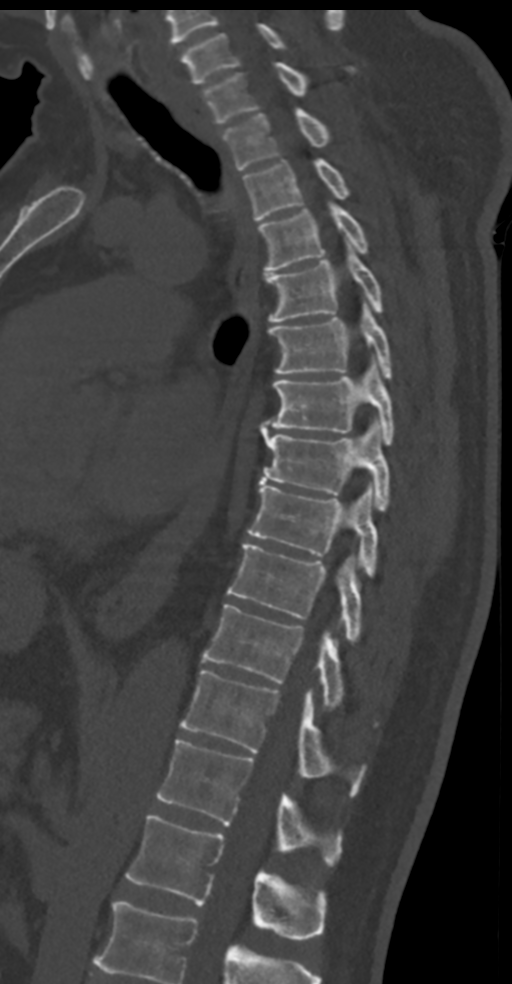
[im 31/61  bone]
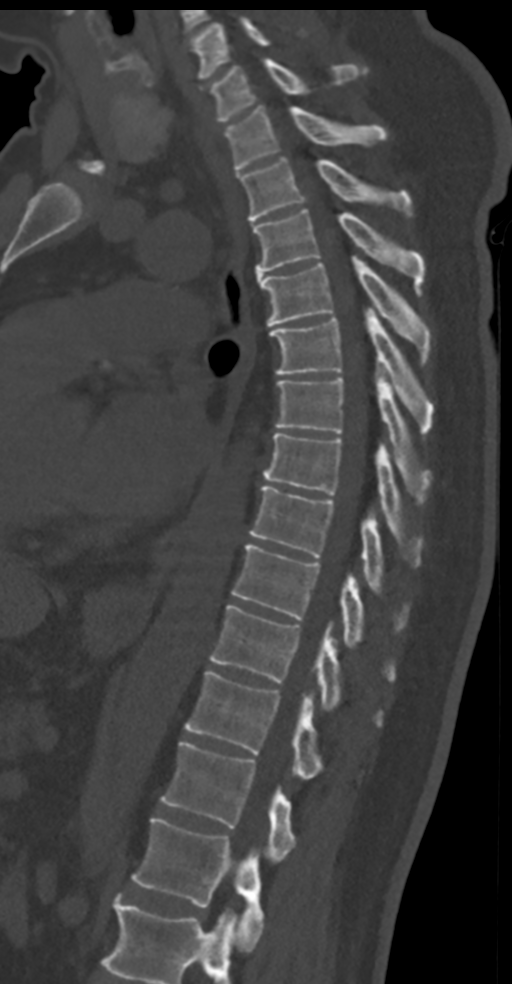
[im 36/61  bone]
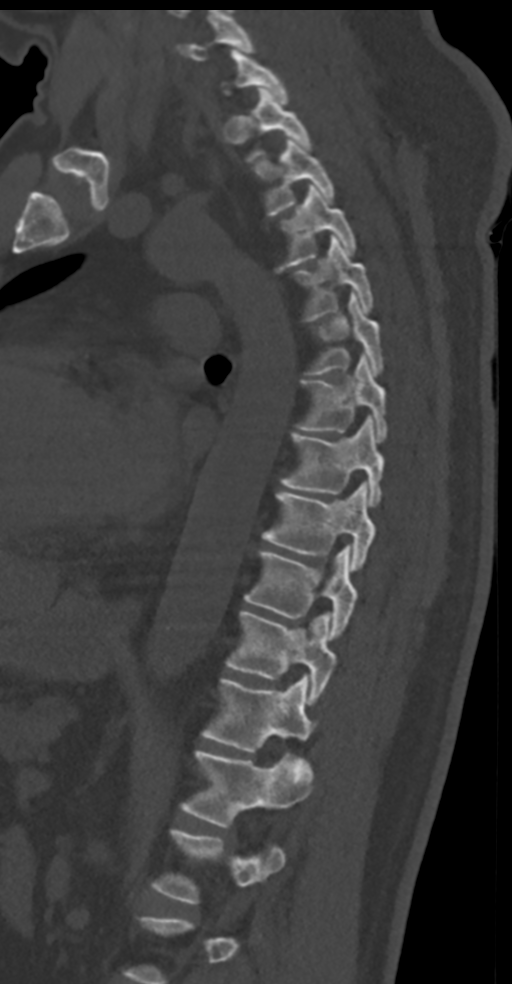
[im 41/61  bone]
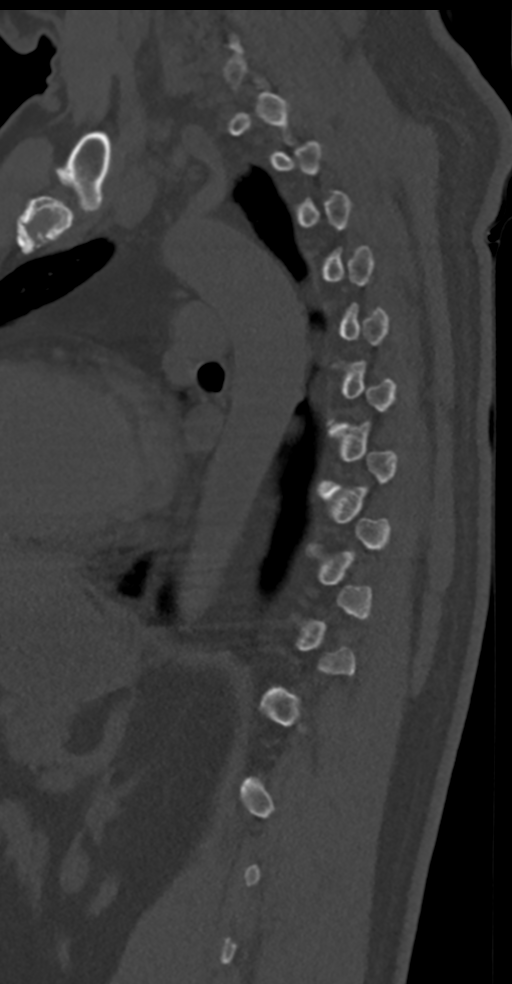

[9 of 33 positions shown; findings below may reference images not displayed]

FINDINGS: Alignment: Normal.

Vertebrae: No vertebral fractures. No suspicious lytic or blastic
lesions are identified.

Paraspinal and other soft tissues: Unremarkable.

Disc levels: Mild degenerative changes are identified, most notably
at C3-4, T4-5, T7-8. Lateral osteophytes are identified at T6-7 and
T8-9.

The visualized portion of the thyroid gland has a normal appearance.
Coronary artery calcification noted. Small sliding-type hiatal
hernia is present. No suspicious pulmonary abnormalities are
identified on the limited imaging of the lungs.

Imaging of the upper abdomen is unremarkable.
IMPRESSION: Mild degenerative changes in the thoracic spine.

No evidence for acute  abnormality.

## 2018-11-04 ENCOUNTER — Ambulatory Visit: Payer: Self-pay | Admitting: Physician Assistant

## 2018-11-08 ENCOUNTER — Other Ambulatory Visit: Payer: Self-pay

## 2018-11-08 ENCOUNTER — Ambulatory Visit (INDEPENDENT_AMBULATORY_CARE_PROVIDER_SITE_OTHER): Payer: Commercial Managed Care - PPO | Admitting: Physician Assistant

## 2018-11-08 ENCOUNTER — Encounter: Payer: Self-pay | Admitting: Physician Assistant

## 2018-11-08 DIAGNOSIS — Z630 Problems in relationship with spouse or partner: Secondary | ICD-10-CM | POA: Diagnosis not present

## 2018-11-08 DIAGNOSIS — R45851 Suicidal ideations: Secondary | ICD-10-CM

## 2018-11-08 DIAGNOSIS — F331 Major depressive disorder, recurrent, moderate: Secondary | ICD-10-CM | POA: Diagnosis not present

## 2018-11-08 DIAGNOSIS — F411 Generalized anxiety disorder: Secondary | ICD-10-CM

## 2018-11-08 MED ORDER — LITHIUM CARBONATE 150 MG PO CAPS: 150.0000 mg | ORAL_CAPSULE | Freq: Every day | ORAL | 1 refills | Status: DC

## 2018-11-08 MED ORDER — VENLAFAXINE HCL ER 150 MG PO CP24: 150.0000 mg | ORAL_CAPSULE | Freq: Every day | ORAL | 1 refills | Status: DC
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
## 2018-11-10 ENCOUNTER — Ambulatory Visit: Payer: Commercial Managed Care - PPO | Admitting: Psychiatry

## 2018-11-18 ENCOUNTER — Encounter: Payer: Self-pay | Admitting: Psychiatry

## 2018-11-18 ENCOUNTER — Other Ambulatory Visit: Payer: Self-pay

## 2018-11-18 ENCOUNTER — Ambulatory Visit (INDEPENDENT_AMBULATORY_CARE_PROVIDER_SITE_OTHER): Payer: Commercial Managed Care - PPO | Admitting: Psychiatry

## 2018-11-18 DIAGNOSIS — F331 Major depressive disorder, recurrent, moderate: Secondary | ICD-10-CM
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
## 2018-11-24 ENCOUNTER — Ambulatory Visit (INDEPENDENT_AMBULATORY_CARE_PROVIDER_SITE_OTHER): Payer: Commercial Managed Care - PPO | Admitting: Physician Assistant

## 2018-11-24 ENCOUNTER — Encounter: Payer: Self-pay | Admitting: Physician Assistant

## 2018-11-24 ENCOUNTER — Ambulatory Visit: Payer: Commercial Managed Care - PPO | Admitting: Physician Assistant

## 2018-11-24 ENCOUNTER — Other Ambulatory Visit: Payer: Self-pay

## 2018-11-24 DIAGNOSIS — F331 Major depressive disorder, recurrent, moderate: Secondary | ICD-10-CM | POA: Diagnosis not present

## 2018-11-24 DIAGNOSIS — Z630 Problems in relationship with spouse or partner: Secondary | ICD-10-CM | POA: Diagnosis not present

## 2018-11-24 DIAGNOSIS — F411 Generalized anxiety disorder: Secondary | ICD-10-CM

## 2018-11-24 NOTE — Progress Notes (Signed)
[facility] Med Check  Patient ID: Sean Alexander,  MRN: [id number]  PCP: Patient, No Pcp Per  Date of Evaluation: 11/24/2018 Time spent:30 minutes  Chief Complaint:  Chief Complaint    Depression; Follow-up      HISTORY/CURRENT STATUS: HPI For routine med check.   Doing a lot better.  He didn't start the lithium.  He and his wife were a little leery about the side effects.  He feels a lot better though, not as hopeless as he did a few weeks ago.  Bumping up the Effexor has been helpful.  He also saw Sean Alexander for counseling which has been good as well.  Patient denies any suicidal thoughts. He is able to enjoy things when possible, his energy and motivation are good most of the time he does not cry easily.   Anxiety is controlled with the Xanax.  He sleeps well most of the time and is not taking the Ambien.    Patient denies increased energy with decreased need for sleep, no increased talkativeness, no racing thoughts, no impulsivity or risky behaviors, no increased spending, no increased libido, no grandiosity.  Denies dizziness, syncope, seizures, numbness, tingling, tremor, tics, unsteady gait, slurred speech, confusion. Denies muscle or joint pain, stiffness, or dystonia.  Individual Medical History/ Review of Systems: Changes? :No    Past medications for mental health diagnoses include: unknown  Allergies: Sulfa antibiotics and Penicillins  Current Medications:  Current Outpatient Medications:  .  ALPRAZolam (XANAX) 0.5 MG tablet, TAKE ONE-HALF TO ONE (1/2 TO 1) TABLET BY MOUTH AS NEEDED, Disp: 30 tablet, Rfl: 1 .  aspirin 81 MG chewable tablet, Chew 81 mg by mouth daily., Disp: , Rfl:  .  Cranberry 500 MG CHEW, Chew 1 Units by mouth daily., Disp: , Rfl:  .  Multiple Vitamins-Minerals (CENTRUM ADULTS) TABS, Take 1 tablet by mouth daily., Disp: , Rfl:  .  venlafaxine XR (EFFEXOR XR) 150 MG 24 hr capsule, Take 1 capsule (150 mg total) by mouth daily with breakfast., Disp: 30  capsule, Rfl: 1 .  lithium carbonate 150 MG capsule, Take 1 capsule (150 mg total) by mouth daily. (Patient not taking: Reported on 11/24/2018), Disp: 30 capsule, Rfl: 1 .  ondansetron (ZOFRAN ODT) 4 MG disintegrating tablet, Take 1 tablet (4 mg total) by mouth every 8 (eight) hours as needed for nausea or vomiting. (Patient not taking: Reported on 11/24/2018), Disp: 10 tablet, Rfl: 0 .  oxyCODONE (OXY IR/ROXICODONE) 5 MG immediate release tablet, Take 1 tablet (5 mg total) by mouth every 4 (four) hours as needed for moderate pain or severe pain. (Patient not taking: Reported on 11/08/2018), Disp: 30 tablet, Rfl: 0 Medication Side Effects: none  Family Medical/ Social History: Changes? Yes see above.  MENTAL HEALTH EXAM:  There were no vitals taken for this visit.There is no height or weight on file to calculate BMI.  General Appearance: Casual, Neat and Well Groomed  Eye Contact:  Good  Speech:  Clear and Coherent  Volume:  Normal  Mood:  Euthymic he is in a more upbeat mood and seems happier  Affect:  Appropriate  Thought Process:  Goal Directed and Descriptions of Associations: Intact  Orientation:  Full (Time, Place, and Person)  Thought Content: Logical   Suicidal Thoughts:  No  Homicidal Thoughts:  No  Memory:  WNL  Judgement:  Good  Insight:  Good  Psychomotor Activity:  Normal  Concentration:  Concentration: Good  Recall:  Good  Fund of Knowledge: Good  Language: Good  Assets:  Desire for Improvement  ADL's:  Intact  Cognition: WNL  Prognosis:  Good    DIAGNOSES:    ICD-10-CM   1. Major depressive disorder, recurrent episode, moderate (HCC)  F33.1   2. Generalized anxiety disorder  F41.1   3. Marital stress  Z63.0     Receiving Psychotherapy: Yes Marriage counseling at church and individual therapy with Sean Alexander, [facility]   RECOMMENDATIONS:  I am glad to see him doing so much better! Continue Effexor XR 150 mg daily. Continue Xanax 0.5 mg  daily as  needed. Continue therapy with Sean Alexander, [facility] and marriage counseling at church. He knows to call here or 911 if the suicidal thoughts recur. Return in 4 weeks.  Donnal Moat, PA-C

## 2018-12-14 ENCOUNTER — Ambulatory Visit (INDEPENDENT_AMBULATORY_CARE_PROVIDER_SITE_OTHER): Payer: Commercial Managed Care - PPO | Admitting: Psychiatry

## 2018-12-14 ENCOUNTER — Encounter: Payer: Self-pay | Admitting: Psychiatry

## 2018-12-14 ENCOUNTER — Other Ambulatory Visit: Payer: Self-pay

## 2018-12-14 DIAGNOSIS — F331 Major depressive disorder, recurrent, moderate: Secondary | ICD-10-CM | POA: Diagnosis not present

## 2018-12-14 NOTE — Progress Notes (Signed)
      [facility] Counselor/Therapist Progress Note  Patient ID: Sean Alexander, MRN: [id number],    Date: 12/14/2018  Time Spent: 50 minutes   Treatment Type: Individual Therapy  Reported Symptoms: Depressed mood  Mental Status Exam:  Appearance:   Casual     Behavior:  Appropriate  Motor:  Normal  Speech/Language:   Clear and Coherent  Affect:  Appropriate  Mood:  depressed  Thought process:  normal  Thought content:    WNL  Sensory/Perceptual disturbances:    WNL  Orientation:  oriented to person, place, time/date and situation  Attention:  Good  Concentration:  Good  Memory:  WNL  Fund of knowledge:   Good  Insight:    Good  Judgment:   Good  Impulse Control:  Good   Risk Assessment: Danger to Self:  No Self-injurious Behavior: No Danger to Others: No Duty to Warn:no Physical Aggression / Violence:No  Access to Firearms a concern: No  Gang Involvement:No   Subjective: I reviewed the goals with the client.  He agreed this is what he wanted to work on.  Today he identified the negative cognitions that bother him the most.  They are; "I do not have the right to be depressed.  I do not have the willpower.  I am mad at myself.  I sabotage myself.  I have lost so much.  I have unrealistic expectations.  I feel weak." I explained the EMDR process for the client and he agreed to the process. We used eye-movement today focused on the concept of him moving forward.  His negative cognition is, "will I make the right decision?  I will fail."  He feels a sense of failure in his chest.  His subjective units of distress is a 6+.  As the client processed he began to discuss how sad he feels at having had the affair.  He also states that he was so desperate for connection because of the lack of connection in his own marriage.  His wife has not had sex with him in 63 years.  He states, "I have been rejected 100 times."  He knows it is not a valid reason for having the affair.  I asked the  client, "how long have you felt like this?"  He states he goes back to his childhood.  As we did eye-movement around this he realized that his older brother garnered all the attention due to his difficulties with type 1 diabetes.  The client stated "I always fantasized about being in the hospital."  I pointed out to the client that this was the only way that he saw to get attention.  The client gained a lot of insight from this.  He realized that most of his life he has been overlooked and undervalued.  Since he was compliant everyone assumed he was okay. The client clearly has more work to do around this area.  His subjective units of distress was less than 3 at the end of the session.  We did not end with a positive cognition because the client still has more work to do.  Interventions: Assertiveness/Communication, Motivational Interviewing, Solution-Oriented/Positive Psychology, [employer] Group Desensitization and Reprocessing (EMDR) and Insight-Oriented  Diagnosis:No diagnosis found.  Plan: Positive self talk, boundaries, assertiveness, self-care.  Jadence Kinlaw, [facility]

## 2018-12-16 ENCOUNTER — Other Ambulatory Visit: Payer: Self-pay | Admitting: Physician Assistant

## 2018-12-16 NOTE — Telephone Encounter (Signed)
Last apt 11/19

## 2018-12-27 ENCOUNTER — Ambulatory Visit: Payer: Commercial Managed Care - PPO | Admitting: Physician Assistant

## 2019-01-13 ENCOUNTER — Telehealth: Payer: Self-pay | Admitting: Physician Assistant

## 2019-01-13 ENCOUNTER — Other Ambulatory Visit: Payer: Self-pay

## 2019-01-13 ENCOUNTER — Encounter: Payer: Self-pay | Admitting: Psychiatry

## 2019-01-13 ENCOUNTER — Ambulatory Visit (INDEPENDENT_AMBULATORY_CARE_PROVIDER_SITE_OTHER): Payer: Commercial Managed Care - PPO | Admitting: Psychiatry

## 2019-01-13 DIAGNOSIS — F331 Major depressive disorder, recurrent, moderate: Secondary | ICD-10-CM | POA: Diagnosis not present

## 2019-01-13 MED ORDER — ALPRAZOLAM 0.5 MG PO TABS: ORAL_TABLET | ORAL | 0 refills | Status: DC

## 2019-01-13 NOTE — Telephone Encounter (Signed)
Last refill 12/19/2018 Pended with fill date 01/16/2019

## 2019-01-13 NOTE — Telephone Encounter (Signed)
Pt requesting refill for Xanax be placed on file. He has scheduled an appt for 02/11. Fill at the [facility] on [address].

## 2019-01-13 NOTE — Progress Notes (Signed)
      [facility] Counselor/Therapist Progress Note  Patient ID: NAKOA KITHCART, MRN: [id number],    Date: 01/13/2019  Time Spent: 50 minutes   Treatment Type: Individual Therapy  Reported Symptoms: sad, anxious  Mental Status Exam:  Appearance:   Casual     Behavior:  Appropriate  Motor:  Normal  Speech/Language:   Clear and Coherent  Affect:  Appropriate  Mood:  anxious and sad  Thought process:  normal  Thought content:    WNL  Sensory/Perceptual disturbances:    WNL  Orientation:  oriented to person, place, time/date and situation  Attention:  Good  Concentration:  Good  Memory:  WNL  Fund of knowledge:   Good  Insight:    Good  Judgment:   Good  Impulse Control:  Good   Risk Assessment: Danger to Self:  No Self-injurious Behavior: No Danger to Others: No Duty to Warn:no Physical Aggression / Violence:No  Access to Firearms a concern: No  Gang Involvement:No   Subjective: The client reports that he has done better with the shame and guilt he feels with his wife.  He noticed it as he thinks about some of the old stuff such as his brother's death it is not as heavily laden with emotion.  Today we focused on the client's self confidence.  There are a lot of things that he does very well.  He admits that he can be obsessive in very detail oriented.  "I enjoy competition.  The difficult part is that I have to continue to do well."  Today we used eye-movement around these issues the client's subjective units of distress was a 6.  As he processed he noted that "I am only as good as my last competition."  I pointed out to the client that he has a performance orientation versus intrinsic value.  He realizes that he judges himself by a very strict standard.  I asked him what it would take to loosen up some?  He is unsure.  We did discuss having better boundaries with himself and not pushing himself to meet unrealistic expectations. The client agreed that sometimes the process is as  enjoyable as the completion.  He will focus on allowing himself just to relax in the moment.  We discussed mindfulness and staying in the present tense.  The client will try to use these skills.  His positive cognition at the end of the session was, "I can be present."  His subjective units of distress was less than 2.  Interventions: Assertiveness/Communication, Mindfulness Meditation, Motivational Interviewing, Solution-Oriented/Positive Psychology, [employer] Group Desensitization and Reprocessing (EMDR) and Insight-Oriented  Diagnosis:   ICD-10-CM   1. Major depressive disorder, recurrent episode, moderate (HCC)  F33.1     Plan: Mindfulness, mood independent behavior, boundaries, assertiveness, self-care.  Shelbey Spindler, [facility]

## 2019-02-15 ENCOUNTER — Ambulatory Visit: Payer: Commercial Managed Care - PPO | Admitting: Psychiatry

## 2019-02-15 ENCOUNTER — Encounter: Payer: Self-pay | Admitting: Psychiatry

## 2019-02-15 ENCOUNTER — Other Ambulatory Visit: Payer: Self-pay

## 2019-02-15 DIAGNOSIS — F331 Major depressive disorder, recurrent, moderate: Secondary | ICD-10-CM | POA: Diagnosis not present

## 2019-02-15 NOTE — Progress Notes (Signed)
      [facility] Counselor/Therapist Progress Note  Patient ID: Sean Alexander, MRN: [id number],    Date: 02/15/2019  Time Spent: 50 minutes   Treatment Type: Individual Therapy  Reported Symptoms: sad, frustrated  Mental Status Exam:  Appearance:   Casual     Behavior:  Appropriate  Motor:  Normal  Speech/Language:   Clear and Coherent  Affect:  Appropriate  Mood:  irritable and sad  Thought process:  normal  Thought content:    WNL  Sensory/Perceptual disturbances:    WNL  Orientation:  oriented to person, place, time/date and situation  Attention:  Good  Concentration:  Good  Memory:  WNL  Fund of knowledge:   Good  Insight:    Good  Judgment:   Good  Impulse Control:  Good   Risk Assessment: Danger to Self:  No Self-injurious Behavior: No Danger to Others: No Duty to Warn:no Physical Aggression / Violence:No  Access to Firearms a concern: No  Gang Involvement:No   Subjective: The client states that he has made good progress on the other issues we have worked on. Today he wanted to focus on why his wife still won't be intimate with him. This has been going on for 10 years. "She is in different to me." The client states that even after he stepped out of the marriage to have the affair things have been just as they were before that. He states his wife has forgiven him and he has made great efforts to make amends. He says, "nothing I do seems to make a difference." The client states that even if he asks directly, "can we have sex?" His wife puts it off until later. Unfortunately "later" never comes. I used eye-movement with the client around this issue. His subjective units of distress was a 10+. He felt frustration throughout his torso. We discussed what he was ready to do at this point? The client stated that he is ready to ask for a divorce. He does not want to ask for divorce but feels he has no option. I suggested to the client that he write a note to his wife detailing  his reasons, lack of intimacy, for wanting a divorce. Then I suggested he give a time limit of 30 days. If nothing has changed by then he will pursue separation. The client agreed that he would do this.   Interventions: Assertiveness/Communication, Motivational Interviewing, Solution-Oriented/Positive Psychology, [employer] Group Desensitization and Reprocessing (EMDR) and Insight-Oriented  Diagnosis:   ICD-10-CM   1. Major depressive disorder, recurrent episode, moderate (HCC)  F33.1     Plan: Letter, boundaries, assertiveness, mood independent behavior, self-care, kindness.  Kinsie Belford, [facility]

## 2019-02-16 ENCOUNTER — Other Ambulatory Visit: Payer: Self-pay

## 2019-02-16 ENCOUNTER — Ambulatory Visit (INDEPENDENT_AMBULATORY_CARE_PROVIDER_SITE_OTHER): Payer: Commercial Managed Care - PPO | Admitting: Physician Assistant

## 2019-02-16 ENCOUNTER — Encounter: Payer: Self-pay | Admitting: Physician Assistant

## 2019-02-16 DIAGNOSIS — F331 Major depressive disorder, recurrent, moderate: Secondary | ICD-10-CM

## 2019-02-16 DIAGNOSIS — F411 Generalized anxiety disorder: Secondary | ICD-10-CM | POA: Diagnosis not present

## 2019-02-16 MED ORDER — ALPRAZOLAM 0.5 MG PO TABS: ORAL_TABLET | ORAL | 5 refills | Status: DC

## 2019-02-16 MED ORDER — VENLAFAXINE HCL ER 150 MG PO CP24: 150.0000 mg | ORAL_CAPSULE | Freq: Every day | ORAL | 5 refills | Status: DC

## 2019-02-16 NOTE — Progress Notes (Signed)
[facility] Check  Patient ID: Sean Alexander,  MRN: [id number]  PCP: Patient, No Pcp Per  Date of Evaluation: 02/16/2019 Time spent:20 minutes  Chief Complaint:  Chief Complaint    Depression; Anxiety; Follow-up      HISTORY/CURRENT STATUS: HPI For routine med check.  Sean Alexander states he is doing well.  Work is going fine.  He sleeps good most of the time.  Anxiety is well controlled with the Xanax and he is not always needing it on a daily basis.  He feels that the Effexor is still doing its job.  He is able to enjoy things.  Energy and motivation are good.  States he and his wife are doing better.  Patient is seeing Sean Alexander in counseling and that is going well.  Denies suicidal or homicidal thoughts.  Patient denies increased energy with decreased need for sleep, no increased talkativeness, no racing thoughts, no impulsivity or risky behaviors, no increased spending, no increased libido, no grandiosity.  Denies dizziness, syncope, seizures, numbness, tingling, tremor, tics, unsteady gait, slurred speech, confusion. Denies muscle or joint pain, stiffness, or dystonia.  Individual Medical History/ Review of Systems: Changes? :No    Past medications for mental health diagnoses include: unknown  Allergies: Sulfa antibiotics and Penicillins  Current Medications:  Current Outpatient Medications:  .  ALPRAZolam (XANAX) 0.5 MG tablet, TAKE 1/2 TO 1 TABLET BY MOUTH AS NEEDED, Disp: 30 tablet, Rfl: 5 .  aspirin 81 MG chewable tablet, Chew 81 mg by mouth daily., Disp: , Rfl:  .  Cranberry 500 MG CHEW, Chew 1 Units by mouth daily., Disp: , Rfl:  .  Multiple Vitamins-Minerals (CENTRUM ADULTS) TABS, Take 1 tablet by mouth daily., Disp: , Rfl:  .  venlafaxine XR (EFFEXOR XR) 150 MG 24 hr capsule, Take 1 capsule (150 mg total) by mouth daily with breakfast., Disp: 30 capsule, Rfl: 5 .  oxyCODONE (OXY IR/ROXICODONE) 5 MG immediate release tablet, Take 1 tablet (5 mg total) by mouth every 4  (four) hours as needed for moderate pain or severe pain. (Patient not taking: Reported on 11/08/2018), Disp: 30 tablet, Rfl: 0 Medication Side Effects: none  Family Medical/ Social History: Changes? No  MENTAL HEALTH EXAM:  There were no vitals taken for this visit.There is no height or weight on file to calculate BMI.  General Appearance: Casual, Neat and Well Groomed  Eye Contact:  Good  Speech:  Clear and Coherent  Volume:  Normal  Mood:  Euthymic  Affect:  Appropriate  Thought Process:  Goal Directed  Orientation:  Full (Time, Place, and Person)  Thought Content: Logical   Suicidal Thoughts:  No  Homicidal Thoughts:  No  Memory:  WNL  Judgement:  Good  Insight:  Good  Psychomotor Activity:  Normal  Concentration:  Concentration: Good  Recall:  Good  Fund of Knowledge: Good  Language: Good  Assets:  Desire for Improvement  ADL's:  Intact  Cognition: WNL  Prognosis:  Good    DIAGNOSES:    ICD-10-CM   1. Major depressive disorder, recurrent episode, moderate (HCC)  F33.1   2. Generalized anxiety disorder  F41.1     Receiving Psychotherapy: Yes  Sean Alexander, [facility]   RECOMMENDATIONS:  I spent 20 minutes with him. PDMP was reviewed. I am glad to see him doing well. Continue Xanax 0.5 mg, 1/2-1 daily as needed. Continue Effexor XR 150 mg daily. Continue counseling with Sean Alexander, [name]. Return in 6 months.  Donnal Moat, PA-C

## 2019-03-17 ENCOUNTER — Ambulatory Visit: Payer: Commercial Managed Care - PPO | Admitting: Psychiatry

## 2019-03-31 ENCOUNTER — Ambulatory Visit (INDEPENDENT_AMBULATORY_CARE_PROVIDER_SITE_OTHER): Payer: Commercial Managed Care - PPO | Admitting: Psychiatry

## 2019-03-31 ENCOUNTER — Other Ambulatory Visit: Payer: Self-pay

## 2019-03-31 ENCOUNTER — Encounter: Payer: Self-pay | Admitting: Psychiatry

## 2019-03-31 DIAGNOSIS — F331 Major depressive disorder, recurrent, moderate: Secondary | ICD-10-CM

## 2019-03-31 NOTE — Progress Notes (Signed)
      [facility] Counselor/Therapist Progress Note  Patient ID: Sean Alexander, MRN: [id number],    Date: 03/31/2019  Time Spent: 50 minutes   Treatment Type: Individual Therapy  Reported Symptoms: depressed mood  Mental Status Exam:  Appearance:   Casual     Behavior:  Appropriate  Motor:  Normal  Speech/Language:   Clear and Coherent  Affect:  Appropriate  Mood:  depressed and irritable  Thought process:  normal  Thought content:    WNL  Sensory/Perceptual disturbances:    WNL  Orientation:  oriented to person, place, time/date and situation  Attention:  Good  Concentration:  Good  Memory:  WNL  Fund of knowledge:   Good  Insight:    Good  Judgment:   Good  Impulse Control:  Good   Risk Assessment: Danger to Self:  No Self-injurious Behavior: No Danger to Others: No Duty to Warn:no Physical Aggression / Violence:No  Access to Firearms a concern: No  Gang Involvement:No   Subjective: The client states that he is not doing well today.  He had wrote his wife a letter as we had discussed in the last session.  He states he was kind but laid out a boundary that if they could not work out their intimacy he would have to file for separation.  He gave her a month to think about it.  Nothing happened.  The client states that he stayed out of the house for a week and a half.  He returned only because he did not have any other options. Today I used eye-movement with the client focusing on his frustration and sadness at his marriage.  His subjective units of distress was a 10.  He felt frustration in his chest.  As the client processed he stated, "I just feel hopeless."  He discussed how much he and his wife had accumulated together with her house in [place].  He cannot see continuing the marriage but does not see a way out.  I suggested to the client that he consider selling the house.  When the house sells he and his wife could split the equity and go their separate ways.  The client  thinks this might be viable and will talk to his wife about it this weekend.  If she is uncomfortable selling the house she could always buy out his half of the equity.  He agreed.  His subjective units of distress was less than 4.  Interventions: Assertiveness/Communication, Motivational Interviewing, Solution-Oriented/Positive Psychology, [employer] Group Desensitization and Reprocessing (EMDR) and Insight-Oriented  Diagnosis:   ICD-10-CM   1. Major depressive disorder, recurrent episode, moderate (HCC)  F33.1     Plan: Discussed with wife about selling the house, assertiveness, boundaries, self-care.  Mychal Durio, [facility]

## 2019-04-18 ENCOUNTER — Ambulatory Visit (INDEPENDENT_AMBULATORY_CARE_PROVIDER_SITE_OTHER): Payer: Commercial Managed Care - PPO | Admitting: Psychiatry

## 2019-04-18 ENCOUNTER — Encounter: Payer: Self-pay | Admitting: Psychiatry

## 2019-04-18 ENCOUNTER — Other Ambulatory Visit: Payer: Self-pay

## 2019-04-18 DIAGNOSIS — F331 Major depressive disorder, recurrent, moderate: Secondary | ICD-10-CM | POA: Diagnosis not present

## 2019-04-18 NOTE — Progress Notes (Signed)
      [facility] Counselor/Therapist Progress Note  Patient ID: Sean Alexander, MRN: [id number],    Date: 04/18/2019  Time Spent: 50 minutes   Treatment Type: Individual Therapy  Reported Symptoms: sad  Mental Status Exam:  Appearance:   Casual     Behavior:  Appropriate  Motor:  Normal  Speech/Language:   Clear and Coherent  Affect:  Appropriate  Mood:  sad  Thought process:  normal  Thought content:    WNL  Sensory/Perceptual disturbances:    WNL  Orientation:  oriented to person, place, time/date and situation  Attention:  Good  Concentration:  Good  Memory:  WNL  Fund of knowledge:   Good  Insight:    Good  Judgment:   Good  Impulse Control:  Good   Risk Assessment: Danger to Self:  No Self-injurious Behavior: No Danger to Others: No Duty to Warn:no Physical Aggression / Violence:No  Access to Firearms a concern: No  Gang Involvement:No   Subjective: The client comes in today with a brighter affect.  He states that he did talk to his wife and they have agreed to either sell their house or she would buy him out.  This is sad for the client but it has given him some closure.  He has continued to think about if their marriage can be saved.  He has not seen any changes from his wife at all in spite of what he has pointed out to her.  He feels all they can do is get the house ready and then either sell it or she buy it from him. The client has focused on his self-care and exercise.  He notes that with the spring weather it has improved his mood.  He likes to keep busy meeting with friends and working in his yard.  He agrees that all his initial goals have been met.  He will follow-up on an as-needed basis going forward.  Interventions: Assertiveness/Communication, Motivational Interviewing, Solution-Oriented/Positive Psychology and Insight-Oriented  Diagnosis:   ICD-10-CM   1. Major depressive disorder, recurrent episode, moderate (HCC)  F33.1     Plan: Assertiveness,  boundaries, self-care, negotiate with wife.  Riyaan Heroux, [facility]

## 2019-05-26 ENCOUNTER — Ambulatory Visit (INDEPENDENT_AMBULATORY_CARE_PROVIDER_SITE_OTHER): Payer: Commercial Managed Care - PPO | Admitting: Psychiatry

## 2019-05-26 ENCOUNTER — Encounter: Payer: Self-pay | Admitting: Psychiatry

## 2019-05-26 ENCOUNTER — Other Ambulatory Visit: Payer: Self-pay

## 2019-05-26 DIAGNOSIS — F331 Major depressive disorder, recurrent, moderate: Secondary | ICD-10-CM

## 2019-05-26 NOTE — Progress Notes (Signed)
      [facility] Counselor/Therapist Progress Note  Patient ID: Sean Alexander, MRN: [id number],    Date: 05/26/2019  Time Spent: 50 minutes   Treatment Type: Individual Therapy  Reported Symptoms: sad  Mental Status Exam:  Appearance:   Casual     Behavior:  Appropriate  Motor:  Normal  Speech/Language:   Clear and Coherent  Affect:  Appropriate  Mood:  sad  Thought process:  normal  Thought content:    WNL  Sensory/Perceptual disturbances:    WNL  Orientation:  oriented to person, place, time/date and situation  Attention:  Good  Concentration:  Good  Memory:  WNL  Fund of knowledge:   Good  Insight:    Good  Judgment:   Good  Impulse Control:  Good   Risk Assessment: Danger to Self:  No Self-injurious Behavior: No Danger to Others: No Duty to Warn:no Physical Aggression / Violence:No  Access to Firearms a concern: No  Gang Involvement:No   Subjective: The client states that he is wife are working on getting their house ready to sell.  He states that things have continued on as always.  They work well together.  He agrees they would probably be better neighbors than husband and wife.  I discussed with the client what his future plans would be?  The [place] land.  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  I encouraged the client to practice mindfulness to stay in the present tense so that he would not ruminate over how poor his marriage has gone.  For the time being he is focused on his [employer] car. I also discussed with the client using mood independent behavior in the dailiness of his life.  Sometimes it is harder to be around his wife but I encouraged him that through radical acceptance that he could accept things that he does not necessarily agree with.  The client will focus on this as well.  Interventions: Mindfulness Meditation, Motivational  Interviewing, Solution-Oriented/Positive Psychology, [employer] Group Desensitization and Reprocessing (EMDR) and Insight-Oriented  Diagnosis:   ICD-10-CM   1. Major depressive disorder, recurrent episode, moderate (HCC)  F33.1     Plan: Radical acceptance, mood independent behavior, self-care, engaged activities, social network.  Sean Alexander, [facility]

## 2019-06-23 ENCOUNTER — Encounter: Payer: Self-pay | Admitting: Psychiatry

## 2019-06-23 ENCOUNTER — Other Ambulatory Visit: Payer: Self-pay

## 2019-06-23 ENCOUNTER — Ambulatory Visit (INDEPENDENT_AMBULATORY_CARE_PROVIDER_SITE_OTHER): Payer: Commercial Managed Care - PPO | Admitting: Psychiatry

## 2019-06-23 DIAGNOSIS — F331 Major depressive disorder, recurrent, moderate: Secondary | ICD-10-CM | POA: Diagnosis not present

## 2019-06-23 NOTE — Progress Notes (Signed)
      [facility] Counselor/Therapist Progress Note  Patient ID: Sean Alexander, MRN: [id number],    Date: 06/23/2019  Time Spent: 50 minutes   Treatment Type: Individual Therapy  Reported Symptoms: sad  Mental Status Exam:  Appearance:   Casual     Behavior:  Appropriate  Motor:  Normal  Speech/Language:   Clear and Coherent  Affect:  Appropriate  Mood:  anxious  Thought process:  normal  Thought content:    WNL  Sensory/Perceptual disturbances:    WNL  Orientation:  oriented to person, place, time/date and situation  Attention:  Good  Concentration:  Good  Memory:  WNL  Fund of knowledge:   Good  Insight:    Good  Judgment:   Good  Impulse Control:  Good   Risk Assessment: Danger to Self:  No Self-injurious Behavior: No Danger to Others: No Duty to Warn:no Physical Aggression / Violence:No  Access to Firearms a concern: No  Gang Involvement:No   Subjective: The client comes in today reporting that he had lied to me during his last session.  He wanted to come clean and discuss what really happened.  He states that he had flown to [place] and spent 3 days with the woman that he had the affair with 2 years ago.  He told his wife he was leaving for a few days but did not tell her why until after he got back.  I asked the client why he did not disclose this before?  He said he was simply embarrassed. Since that time the client has felt very convicted that this relationship and having the affair is not the best for him.  He knows his wife is angry and they have discussed it some.  He recently gave the girlfriend of thousand dollars to help with some moving expenses.  His wife was upset with him about this as well.  The client has decided that he does not want to leave but continue with his wife as things are.  He realized that sex was not as important to him as he thought.  He also realized that the woman he connected with in [place] would ultimately not be a good match for him.   He has ended that relationship.  I used the bilateral stimulation hand paddles with the client as he discussed why he lied.  His sadness went from a subjective units of distress of 6 to less than 2.  Interventions: Assertiveness/Communication, Motivational Interviewing, Solution-Oriented/Positive Psychology, [employer] Desensitization and Reprocessing (EMDR) and Insight-Oriented  Diagnosis:   ICD-10-CM   1. Major depressive disorder, recurrent episode, moderate (HCC)  F33.1     Plan: Boundaries, assertiveness, self-care, positive self talk, radical acceptance.  Hans Rusher, [facility]

## 2019-07-14 IMAGING — DX DG ABDOMEN ACUTE W/ 1V CHEST
4 series · 4 of 4 positions shown · non-contrast
Comparison: CT 09/16/2015

CLINICAL DATA: Abdominal pain.  Bloating.

EXAM:
DG ABDOMEN ACUTE W/ 1V CHEST

[chest pa]
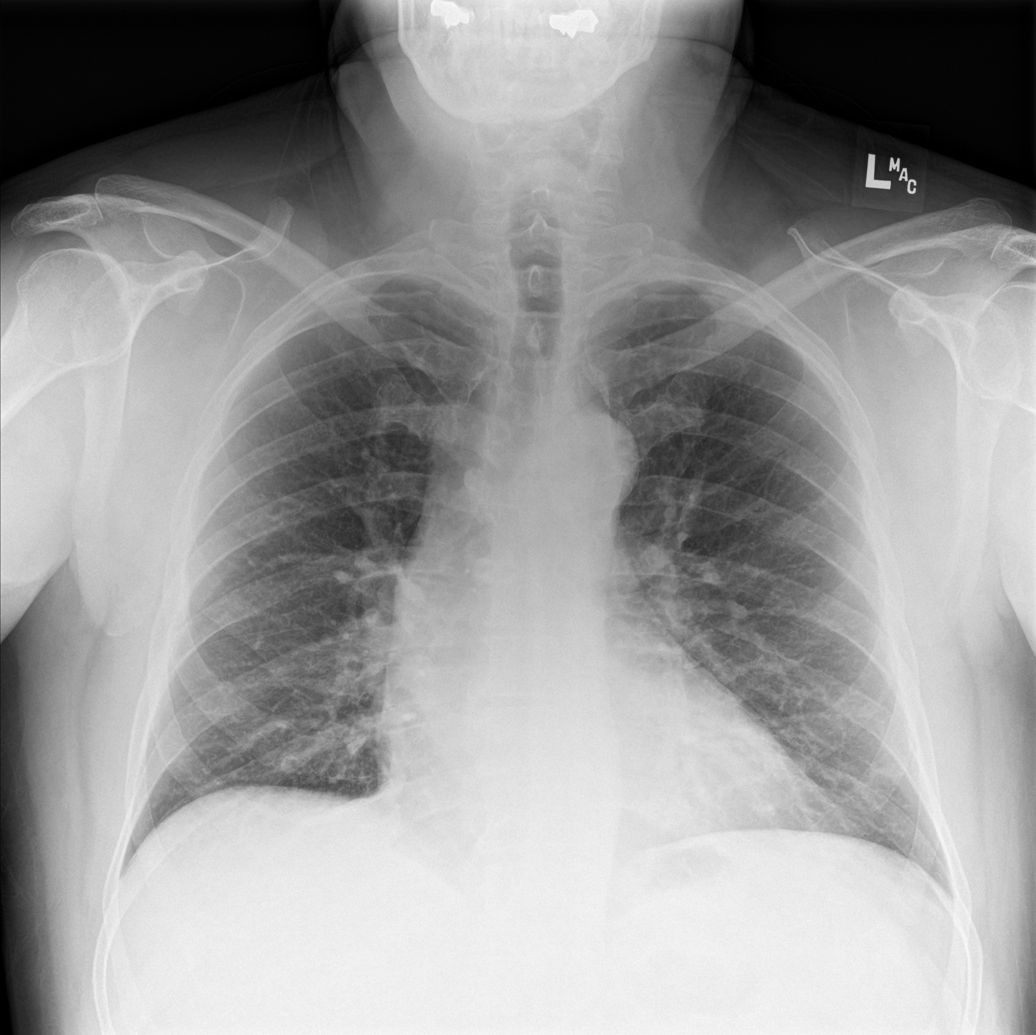

[abdomen erect]
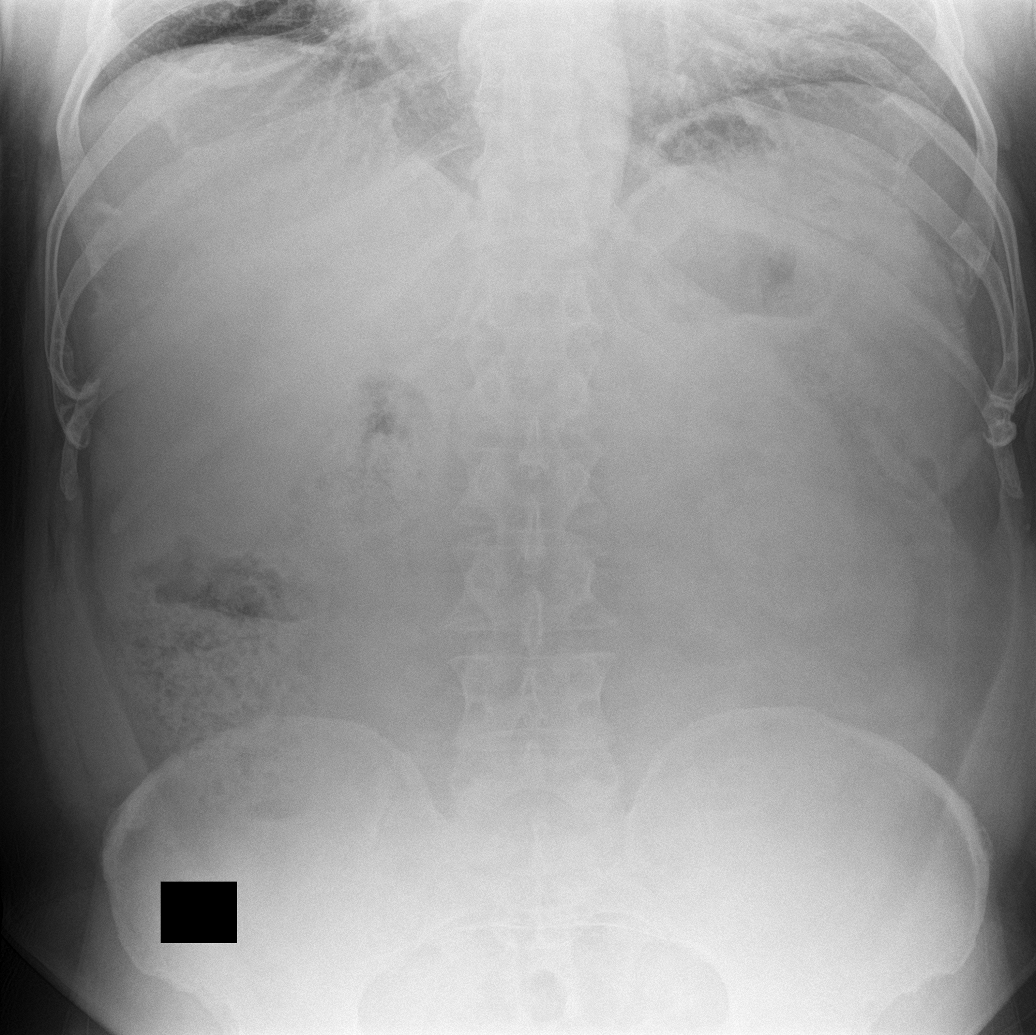

[abdomen supine (1 of 2)]
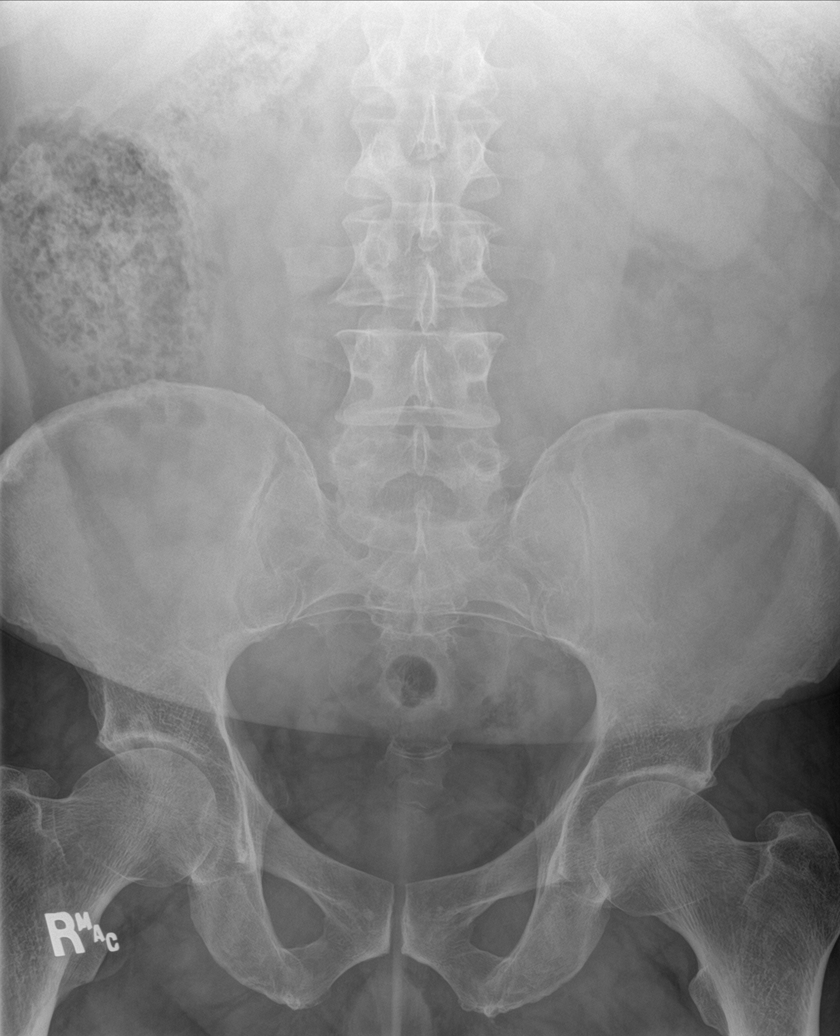

[abdomen supine (2 of 2)]
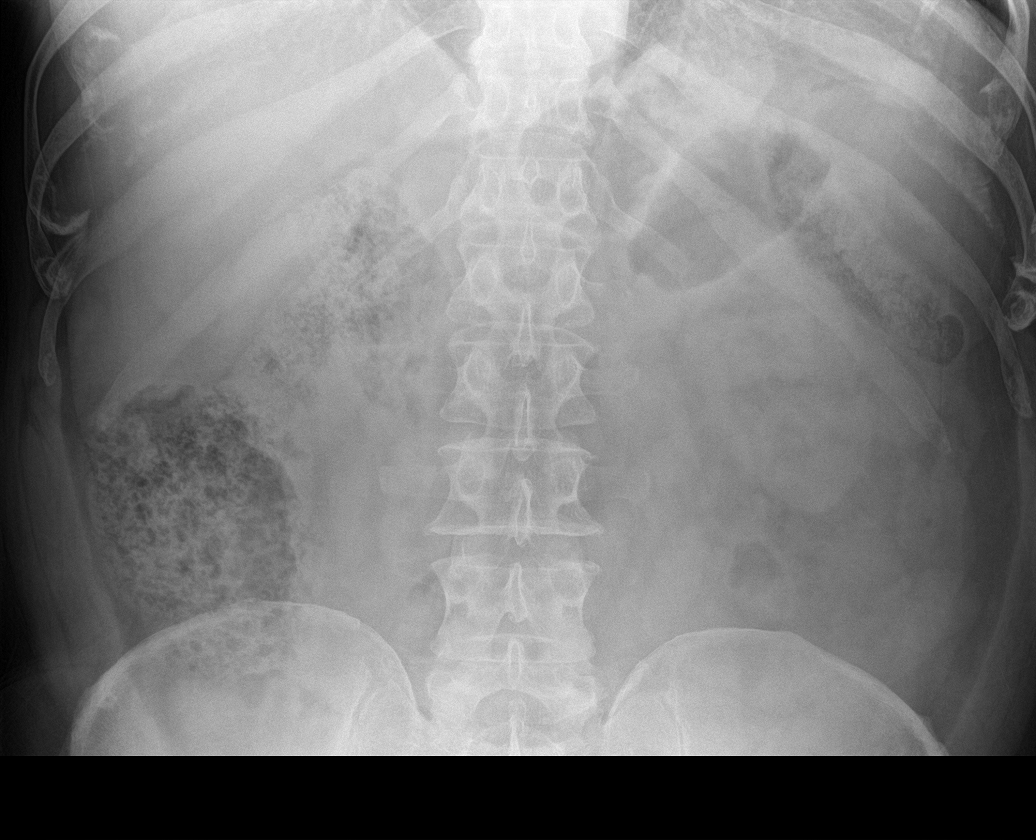

[4 of 4 positions shown; findings below may reference images not displayed]

FINDINGS: There is no evidence of dilated bowel loops or free intraperitoneal
air. No radiopaque calculi or other significant radiographic
abnormality is seen. Heart size and mediastinal contours are within
normal limits. Both lungs are clear.
IMPRESSION: Negative abdominal radiographs.  No acute cardiopulmonary disease.

## 2019-07-14 IMAGING — CT CT ABD-PELV W/ CM
2 of 5 series · 16 of 46 positions shown, 18 images · IV contrast (APPLIED)
Comparison: Radiographs 09/02/2016, CT 09/16/2015.

CLINICAL DATA: Abdominal bloating and fullness for 2 days.

EXAM:
CT ABDOMEN AND PELVIS WITH CONTRAST
TECHNIQUE: Multidetector CT imaging of the abdomen and pelvis was performed
using the standard protocol following bolus administration of
intravenous contrast.
CONTRAST:  100mL PCUQ6F-P88 IOPAMIDOL (PCUQ6F-P88) INJECTION 61%

[Series 2: axial st · axial · 0.85mm/px · z∈[-550,-70]mm · 13 of 110 slices shown, 15 images]
[im 7/110  soft-tissue]
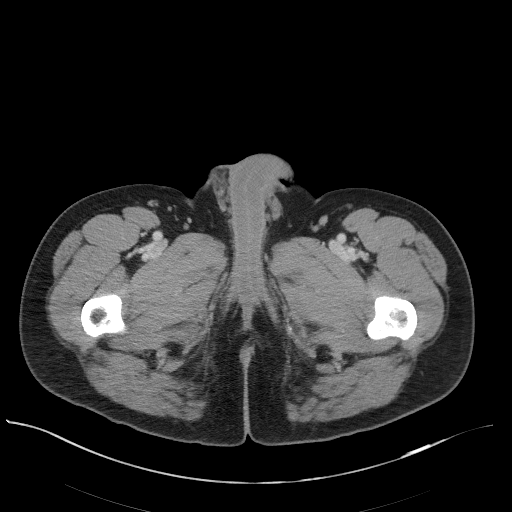
[im 7/110  bone]
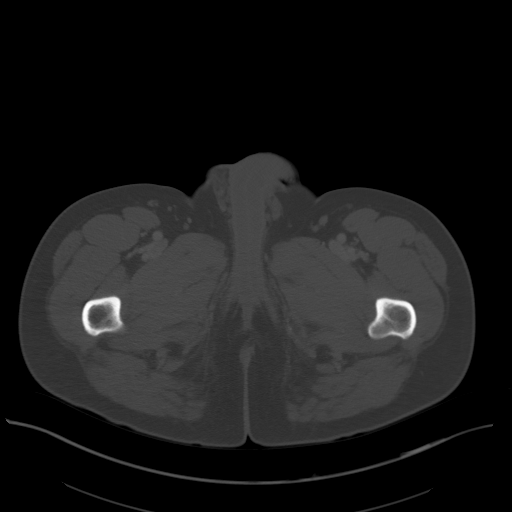
[im 13/110  soft-tissue]
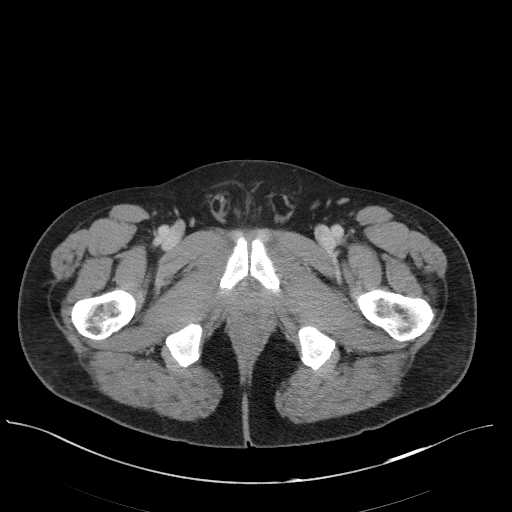
[im 25/110  soft-tissue]
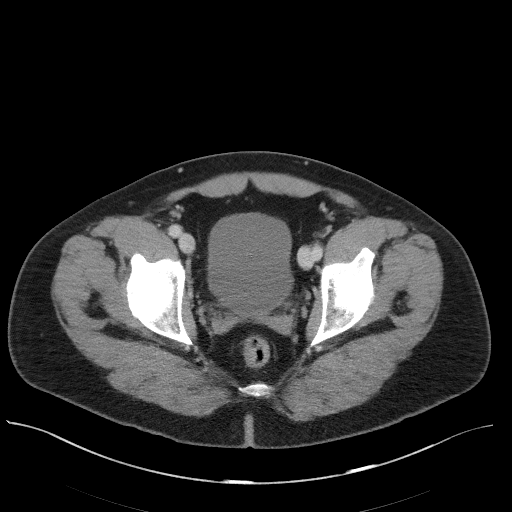
[im 31/110  soft-tissue]
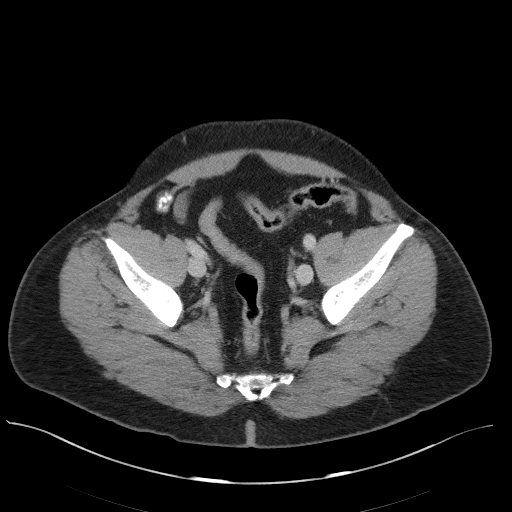
[im 37/110  soft-tissue]
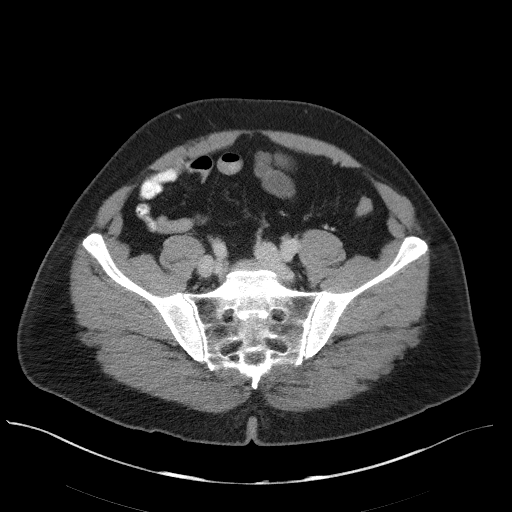
[im 49/110  soft-tissue]
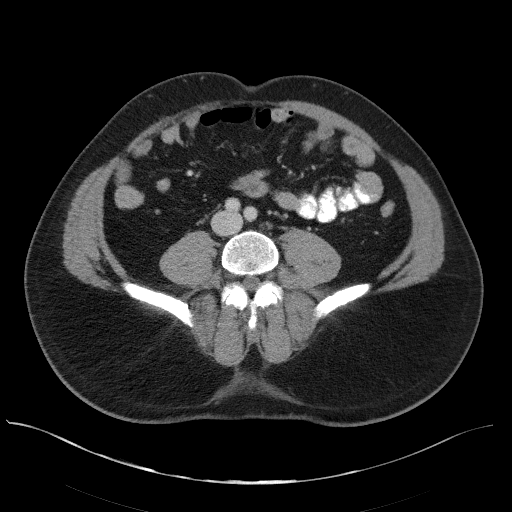
[im 55/110  soft-tissue]
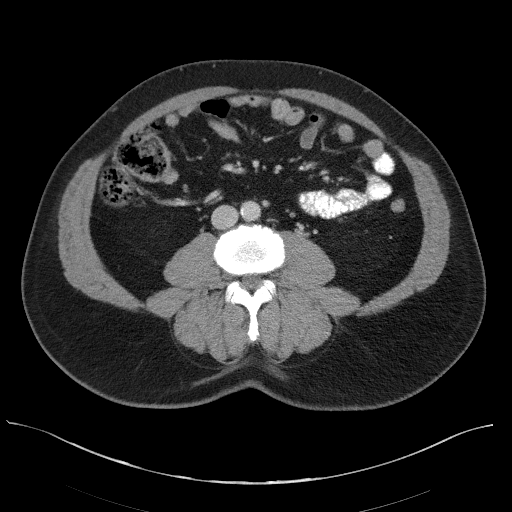
[im 61/110  soft-tissue]
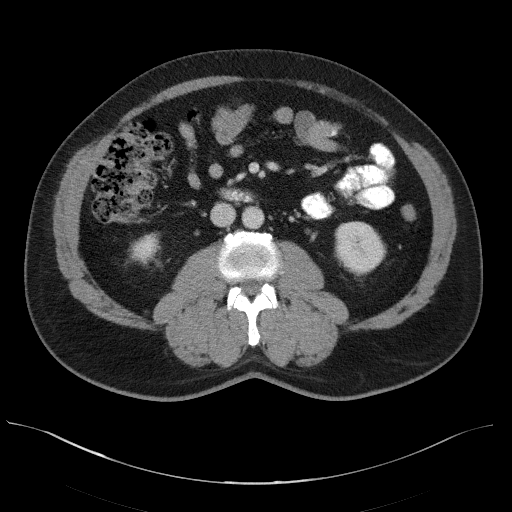
[im 73/110  soft-tissue]
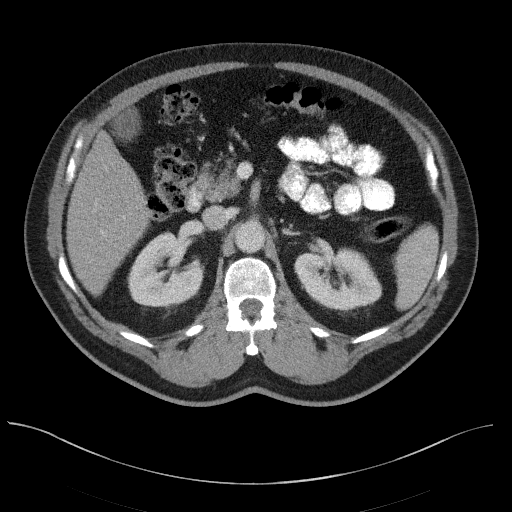
[im 73/110  bone]
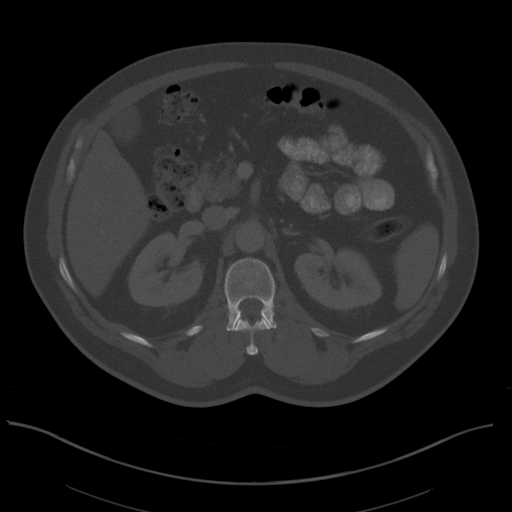
[im 79/110  soft-tissue]
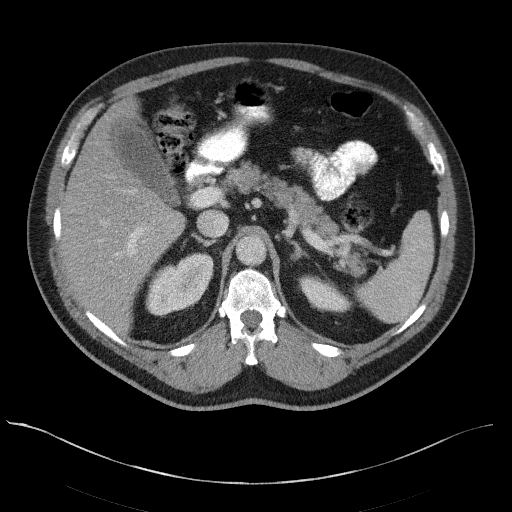
[im 85/110  soft-tissue]
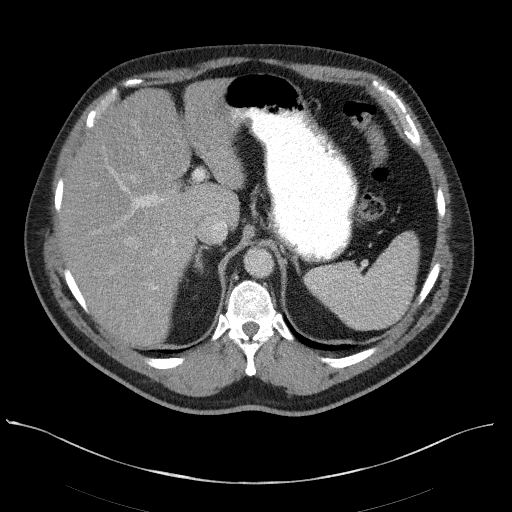
[im 97/110  soft-tissue]
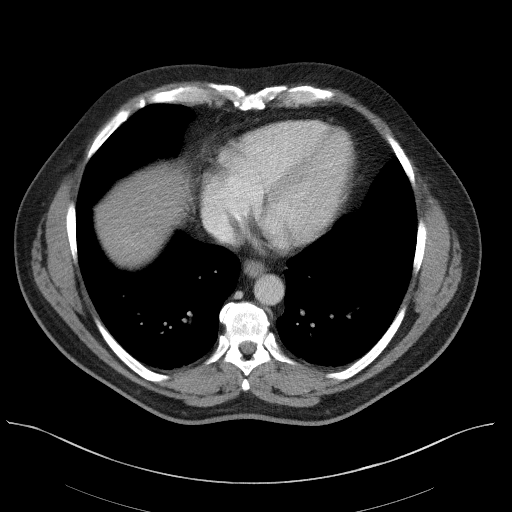
[im 103/110  soft-tissue]
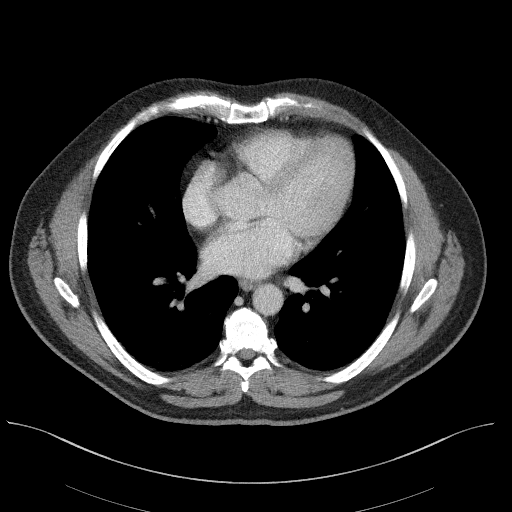

[Series 5: coronal st · coronal · 0.79mm/px · 3 of 96 slices shown]
[im 32/96  soft-tissue]
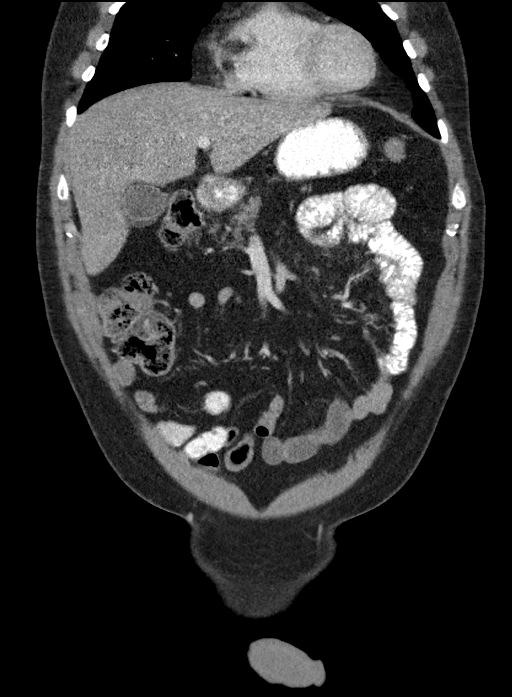
[im 43/96  soft-tissue]
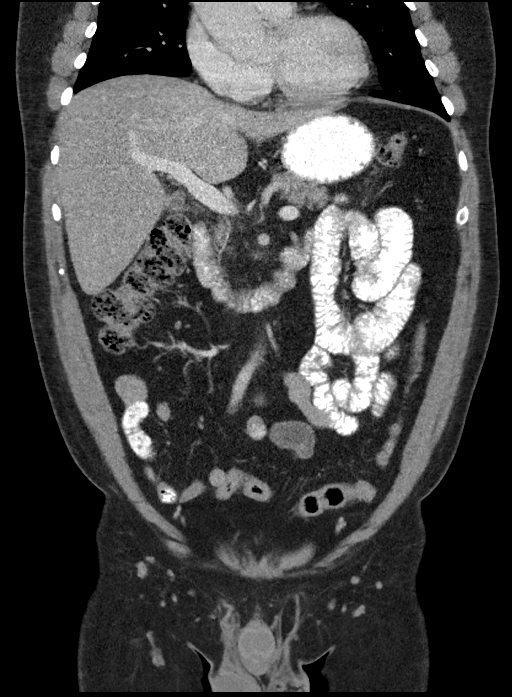
[im 53/96  soft-tissue]
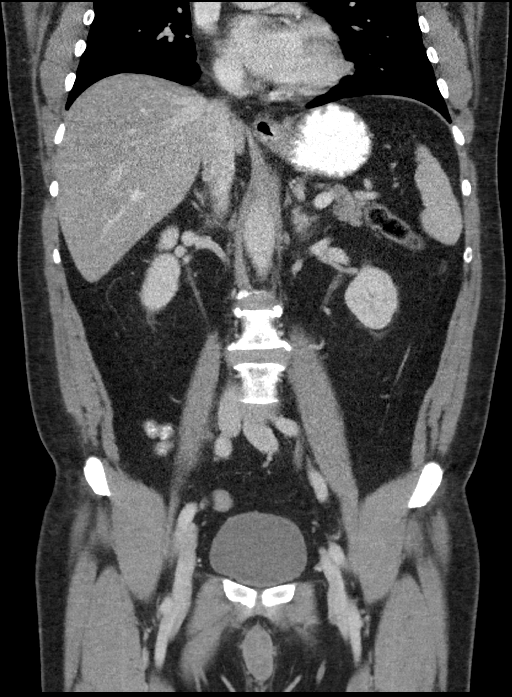

[16 of 46 positions shown; findings below may reference images not displayed]

FINDINGS: Lower chest: No acute abnormality.

Hepatobiliary: No focal liver lesions. Moderate gallbladder mural
thickening and edema, suspicious for cholecystitis. No bile duct
dilatation.

Pancreas: Unremarkable. No pancreatic ductal dilatation or
surrounding inflammatory changes.

Spleen: Normal in size without focal abnormality.

Adrenals/Urinary Tract: Adrenal glands are unremarkable. Kidneys are
normal, without renal calculi, focal lesion, or hydronephrosis.
Bladder is unremarkable.

Stomach/Bowel: Small hiatal hernia. Small bowel is normal. Appendix
is normal. Colon is unremarkable. No focal inflammation of bowel. No
obstruction. No extraluminal gas.

Vascular/Lymphatic: The abdominal aorta is normal in caliber with
mild atherosclerotic calcification. No adenopathy in the abdomen or
pelvis.

Reproductive: Unremarkable

Other: No ascites.  Small fat containing umbilical hernia.

Musculoskeletal: No significant skeletal lesion.
IMPRESSION: 1. Moderate gallbladder mural thickening and edema, suspicious for
acute cholecystitis. No bile duct dilatation.
2. Small hiatal hernia.
3. Aortic atherosclerosis.
4. Small fat containing umbilical hernia.

## 2019-07-18 ENCOUNTER — Ambulatory Visit: Payer: Commercial Managed Care - PPO | Admitting: Psychiatry

## 2019-08-12 ENCOUNTER — Other Ambulatory Visit: Payer: Self-pay | Admitting: Physician Assistant

## 2019-08-14 NOTE — Telephone Encounter (Signed)
Has apt 08/11

## 2019-08-16 ENCOUNTER — Ambulatory Visit (INDEPENDENT_AMBULATORY_CARE_PROVIDER_SITE_OTHER): Payer: Commercial Managed Care - PPO | Admitting: Physician Assistant

## 2019-08-16 ENCOUNTER — Encounter: Payer: Self-pay | Admitting: Physician Assistant

## 2019-08-16 ENCOUNTER — Other Ambulatory Visit: Payer: Self-pay

## 2019-08-16 DIAGNOSIS — F3341 Major depressive disorder, recurrent, in partial remission: Secondary | ICD-10-CM

## 2019-08-16 DIAGNOSIS — F411 Generalized anxiety disorder: Secondary | ICD-10-CM | POA: Diagnosis not present

## 2019-08-16 DIAGNOSIS — G4700 Insomnia, unspecified: Secondary | ICD-10-CM

## 2019-08-16 MED ORDER — VENLAFAXINE HCL ER 150 MG PO CP24: 150.0000 mg | ORAL_CAPSULE | Freq: Every day | ORAL | 5 refills | Status: DC

## 2019-08-16 MED ORDER — ALPRAZOLAM 0.5 MG PO TABS: ORAL_TABLET | ORAL | 5 refills | Status: DC

## 2019-08-16 NOTE — Progress Notes (Signed)
[facility] Med Check  Patient ID: Sean Alexander,  MRN: [id number]  PCP: Patient, No Pcp Per  Date of Evaluation: 08/16/2019 Time spent:20 minutes  Chief Complaint:  Chief Complaint    Anxiety; Depression      HISTORY/CURRENT STATUS: HPI For routine med check.  Sean Alexander states that he is doing really well.  The medications still help with anxiety and depression.  He is able to enjoy things.  Energy and motivation are good.  He is not missing any work.  He sleeps well but usually has to take half of his Xanax at least before bed.  If he does not, he is not able to stay asleep.  He usually does feel rested in the mornings.  He is not isolating.  Not needing the Xanax during the day except rarely.  He continues to see Sean Alexander, [facility] in counseling which is very beneficial.  He denies suicidal or homicidal thoughts.  Patient denies increased energy with decreased need for sleep, no increased talkativeness, no racing thoughts, no impulsivity or risky behaviors, no increased spending, no increased libido, no grandiosity.  Denies dizziness, syncope, seizures, numbness, tingling, tremor, tics, unsteady gait, slurred speech, confusion. Denies muscle or joint pain, stiffness, or dystonia.  Individual Medical History/ Review of Systems: Changes? :No    Past medications for mental health diagnoses include: unknown  Allergies: Sulfa antibiotics and Penicillins  Current Medications:  Current Outpatient Medications:  .  ALPRAZolam (XANAX) 0.5 MG tablet, TAKE 1/2 TO 1 TABLET BY MOUTH DAILY AS NEEDED, Disp: 30 tablet, Rfl: 5 .  Cranberry 500 MG CHEW, Chew 1 Units by mouth daily., Disp: , Rfl:  .  Multiple Vitamins-Minerals (CENTRUM ADULTS) TABS, Take 1 tablet by mouth daily., Disp: , Rfl:  .  venlafaxine XR (EFFEXOR XR) 150 MG 24 hr capsule, Take 1 capsule (150 mg total) by mouth daily with breakfast., Disp: 30 capsule, Rfl: 5 .  aspirin 81 MG chewable tablet, Chew 81 mg by mouth daily. (Patient  not taking: Reported on 08/16/2019), Disp: , Rfl:  .  oxyCODONE (OXY IR/ROXICODONE) 5 MG immediate release tablet, Take 1 tablet (5 mg total) by mouth every 4 (four) hours as needed for moderate pain or severe pain. (Patient not taking: Reported on 11/08/2018), Disp: 30 tablet, Rfl: 0 Medication Side Effects: none  Family Medical/ Social History: Changes? No  MENTAL HEALTH EXAM:  There were no vitals taken for this visit.There is no height or weight on file to calculate BMI.  General Appearance: Casual, Neat and Well Groomed  Eye Contact:  Good  Speech:  Clear and Coherent and Normal Rate  Volume:  Normal  Mood:  Euthymic  Affect:  Appropriate  Thought Process:  Goal Directed  Orientation:  Full (Time, Place, and Person)  Thought Content: Logical   Suicidal Thoughts:  No  Homicidal Thoughts:  No  Memory:  WNL  Judgement:  Good  Insight:  Good  Psychomotor Activity:  Normal  Concentration:  Concentration: Good  Recall:  Good  Fund of Knowledge: Good  Language: Good  Assets:  Desire for Improvement  ADL's:  Intact  Cognition: WNL  Prognosis:  Good    DIAGNOSES:    ICD-10-CM   1. Recurrent major depressive disorder, in partial remission (Sean Alexander)  F33.41   2. Generalized anxiety disorder  F41.1   3. Insomnia, unspecified type  G47.00     Receiving Psychotherapy: Yes  Sean Alexander, [facility]   RECOMMENDATIONS:  PDMP was reviewed. I provided 20  minutes of face-to-face time during this encounter. I am glad to see him doing so well! Continue Xanax 0.5 mg, 1/2-1 daily as needed. Continue Effexor XR 150 mg daily. Continue counseling with Sean Alexander, [facility]. Return in 6 months.  Donnal Moat, PA-C

## 2019-08-25 ENCOUNTER — Ambulatory Visit (INDEPENDENT_AMBULATORY_CARE_PROVIDER_SITE_OTHER): Payer: Commercial Managed Care - PPO | Admitting: Psychiatry

## 2019-08-25 ENCOUNTER — Encounter: Payer: Self-pay | Admitting: Psychiatry

## 2019-08-25 ENCOUNTER — Other Ambulatory Visit: Payer: Self-pay

## 2019-08-25 DIAGNOSIS — F331 Major depressive disorder, recurrent, moderate: Secondary | ICD-10-CM

## 2019-08-25 NOTE — Progress Notes (Signed)
      [facility] Counselor/Therapist Progress Note  Patient ID: Sean Alexander, MRN: [id number],    Date: 08/25/2019  Time Spent: 50 minutes   Treatment Type: Individual Therapy  Reported Symptoms: sad  Mental Status Exam:  Appearance:   Casual     Behavior:  Appropriate  Motor:  Normal  Speech/Language:   Clear and Coherent  Affect:  Appropriate  Mood:  sad  Thought process:  normal  Thought content:    WNL  Sensory/Perceptual disturbances:    WNL  Orientation:  oriented to person, place, time/date and situation  Attention:  Good  Concentration:  Good  Memory:  WNL  Fund of knowledge:   Good  Insight:    Good  Judgment:   Good  Impulse Control:  Good   Risk Assessment: Danger to Self:  No Self-injurious Behavior: No Danger to Others: No Duty to Warn:no Physical Aggression / Violence:No  Access to Firearms a concern: No  Gang Involvement:No   Subjective: The client states that he and his wife are still together.  Their relationship continues to be sexless because of her lack of interest.  This is sad for the client but he is trying to make the best of the circumstances.  He is engaged in [employer] as a hobby for himself.  His wife is also involved in this hobby with him.  "We do get along together."  We discussed at length what the client's long-term plans were.  He has decided that he is not going to go for divorce.  "I am in it for the long haul." I discussed with the client about how he can increase his social network.  He is very social and I believe the more connections he can make with people that happier he will be.  He agrees.  I also encouraged the client to begin to focus on some exercise for himself.  The client enjoys his job and finds fellowship there.  He and his wife are involved in a good church that he feels connected to.  I explained more of the need for self-care especially to help him that his depressed mood.  The client will look into omega-3 fatty  acids as well as more sunlight exposure.  He feels his sleep is pretty good right now.  Interventions: Assertiveness/Communication, Motivational Interviewing, Solution-Oriented/Positive Psychology and Insight-Oriented  Diagnosis:   ICD-10-CM   1. Major depressive disorder, recurrent episode, moderate ([place])  F33.1     Plan: Engaged activities, social network, positive self talk, self-care, sunlight exposure, omega-3 fatty acids, assertiveness, boundaries.  Janique Hoefer, [facility]

## 2019-10-25 ENCOUNTER — Ambulatory Visit: Payer: Commercial Managed Care - PPO | Admitting: Psychiatry

## 2019-11-22 ENCOUNTER — Ambulatory Visit: Payer: Commercial Managed Care - PPO | Admitting: Psychiatry

## 2019-12-20 ENCOUNTER — Ambulatory Visit: Payer: Commercial Managed Care - PPO | Admitting: Psychiatry

## 2020-01-10 ENCOUNTER — Ambulatory Visit: Payer: Commercial Managed Care - PPO | Attending: Sports Medicine | Admitting: Physical Therapy

## 2020-01-10 ENCOUNTER — Encounter: Payer: Self-pay | Admitting: Physical Therapy

## 2020-01-10 ENCOUNTER — Other Ambulatory Visit: Payer: Self-pay

## 2020-01-10 DIAGNOSIS — M25562 Pain in left knee: Secondary | ICD-10-CM

## 2020-01-10 DIAGNOSIS — R262 Difficulty in walking, not elsewhere classified: Secondary | ICD-10-CM

## 2020-01-10 NOTE — Therapy (Signed)
[facility] [address] [place], [place], [address] Phone: [phone]   Fax:  [phone]  Physical Therapy Evaluation  Patient Details  Name: Sean Alexander S Surges MRN: [id number] Date of Birth: 11-23-1960 Referring Provider (PT): Lawanda CousinsKendall   Encounter Date: 01/10/2020   PT End of Session - 01/10/20 1620    Visit Number 1    Date for PT Re-Evaluation 03/09/20    PT Start Time 1530    PT Stop Time 1615    PT Time Calculation (min) 45 min    Activity Tolerance Patient tolerated treatment well    Behavior During Therapy [facility] for tasks assessed/performed           Past Medical History:  Diagnosis Date  . Depression   . Hepatitis A virus infection   . Kidney [place] 2017  . Kidney stones   . Sleep apnea    uses CPAP    Past Surgical History:  Procedure Laterality Date  . CHOLECYSTECTOMY N/A 09/02/2016   Procedure: LAPAROSCOPIC CHOLECYSTECTOMY WITH INTRAOPERATIVE CHOLANGIOGRAM;  Surgeon: Avel Peaceosenbower, Todd, MD;  Location: [facility];  Service: General;  Laterality: N/A;  . HERNIA REPAIR  1986    There were no vitals filed for this visit.    Subjective Assessment - 01/10/20 1534    Subjective Patient reports about 18 months ago his left knee started hurting.  He reports that he got a brace from the MD and it was doing great .  reports about 4-5 months ago, pain returned, MRI revealed medial meniscus tear.    Patient Stated Goals avoid surgery, have less pain    Currently in Pain? No/denies    Pain Score 0-No pain    Pain Location Knee    Pain Orientation Left;Medial    Pain Descriptors / Indicators Burning;Aching    Pain Type Acute pain    Pain Onset More than a month ago    Pain Frequency Intermittent    Aggravating Factors  bending the knee, at night, walking pain up to 6/10    Pain Relieving Factors change position, walk stiff legged, pain will be 0/10    Effect of Pain on Daily Activities pain at night, walk stiff legged               [facility] - 01/10/20 0001      Assessment   Medical Diagnosis left medial meniscus tear    Referring Provider (PT) Penni BombardKendall    Onset Date/Surgical Date 09/10/19    Prior Therapy no      Precautions   Precautions None      Balance Screen   Has the patient fallen in the past 6 months No    Has the patient had a decrease in activity level because of a fear of falling?  No    Is the patient reluctant to leave their home because of a fear of falling?  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, some squatting    Leisure no exercise      ROM / Strength   AROM / PROM / Strength AROM;Strength      AROM   Overall AROM Comments pain with flexion    AROM Assessment Site Knee    Right/Left  Knee Left    Left Knee Extension 5    Left Knee Flexion 118      Strength   Overall Strength Comments 4+/5 with mild pain during flexion of the left knee extension 5/5      Flexibility   Soft Tissue Assessment /Muscle Length yes   calf tightness   Hamstrings mildly tight    ITB tight    Piriformis tight, caused pain in the knee      Palpation   Palpation comment palpable and audible crepitus      Ambulation/Gait   Gait Comments no deivce, stiff legged on the left , slight toe out gait, up and down stairs, going up can go step over step and then going down has to do one at a time even on 4" step.  He reports not feeling stable in the left knee "like it is going to go the wrong direction"                      Objective measurements completed on examination: See above findings.                 PT Short Term Goals - 01/10/20 1625      PT SHORT TERM GOAL #1   Title independent with initial HEP    Time 2    Period Weeks    Status New             PT  Long Term Goals - 01/10/20 1625      PT LONG TERM GOAL #1   Title go up and down stairs step over step    Time 8    Period Weeks    Status New      PT LONG TERM GOAL #2   Title report 50% less pain at night    Time 8    Period Weeks    Status New      PT LONG TERM GOAL #3   Title understand the body mechanics to avoid further injury    Time 8    Period Weeks    Status New      PT LONG TERM GOAL #4   Title walk without deviation    Time 8    Period Weeks    Status New                  Plan - 01/10/20 1621    Clinical Impression Statement Patient reports tha the had left knee pain start about 18 months ago, unsure of a cause, got a brace and it did well until about 5 months ago, reports that pain returned, he reports that he has to walk stiff legged and has to go down stairs one at a time.  MRI shoed a medial meniscus tear and he would like to avoid surgery, he seems to have gaurded this leg and has difficulty with a 2" step, knee does not straighten all the way with this and he feels like it may snap back or go side to side.  He does have some varus at the knee.  LE mms are mildly tight, biggest worry is the compensation patterns tha the has started.    Stability/Clinical Decision Making Stable/Uncomplicated    Clinical Decision Making Low    Rehab Potential Good    PT Frequency 2x / week    PT Duration 8 weeks    PT Treatment/Interventions ADLs/Self Care Home Management;Gait training;Stair training;Functional mobility  training;Therapeutic activities;Therapeutic exercise;Balance training;Neuromuscular re-education;Manual techniques;Patient/family education    PT Next Visit Plan may start out at 1x/week due to deductible, work on strength, better gait pattern and proprioception    Consulted and Agree with Plan of Care Patient           Patient will benefit from skilled therapeutic intervention in order to improve the following deficits and impairments:  Abnormal  gait,Decreased range of motion,Difficulty walking,Pain,Impaired flexibility,Decreased balance,Decreased strength  Visit Diagnosis: Acute pain of left knee - Plan: PT plan of care cert/re-cert  Difficulty in walking, not elsewhere classified - Plan: PT plan of care cert/re-cert     Problem List Patient Active Problem List   Diagnosis Date Noted  . GAD (generalized anxiety disorder) 02/16/2018  . Insomnia 02/16/2018  . RUQ pain 09/02/2016  . OSA on CPAP 11/23/2014  . Depression 01/17/2014  . Evaluate for Obstructive sleep apnea 01/17/2014  . Hypersomnolence 01/17/2014  . Anxiety 01/17/2014    Sumner Boast., PT 01/10/2020, 4:27 PM  [address] [place], [place], [address] Phone: [phone]   Fax:  [phone]  Name: LABRIAN BARSAMIAN MRN: [id number] Date of Birth: 03/07/1960

## 2020-01-10 NOTE — Patient Instructions (Signed)
Access Code: [email or web address] URL: [email or web address] Date: 01/10/2020 Prepared by: Stacie Glaze  Exercises Standing Bilateral Gastroc Stretch with Step - 2 x daily - 7 x weekly - 1 sets - 5 reps - 30 hold Supine Short Arc Quad - 2 x daily - 7 x weekly - 2 sets - 10 reps - 3 hold Lateral Step Down - 2 x daily - 7 x weekly - 1 sets - 10 reps - 1 hold

## 2020-01-16 ENCOUNTER — Ambulatory Visit: Payer: Commercial Managed Care - PPO | Admitting: Physical Therapy

## 2020-01-16 ENCOUNTER — Encounter: Payer: Self-pay | Admitting: Physical Therapy

## 2020-01-16 ENCOUNTER — Other Ambulatory Visit: Payer: Self-pay

## 2020-01-16 DIAGNOSIS — R262 Difficulty in walking, not elsewhere classified: Secondary | ICD-10-CM

## 2020-01-16 DIAGNOSIS — M25562 Pain in left knee: Secondary | ICD-10-CM | POA: Diagnosis not present

## 2020-01-16 NOTE — Therapy (Signed)
[address] [place], [place], [address] Phone: [phone]   Fax:  [phone]  Physical Therapy Treatment  Patient Details  Name: Sean Alexander MRN: [id number] Date of Birth: 07/30/1960 Referring Provider (PT): Rosalie Gums Date: 01/16/2020   PT End of Session - 01/16/20 6962    Visit Number 2    Date for PT Re-Evaluation 03/09/20    PT Start Time 1600    PT Stop Time 1645    PT Time Calculation (min) 45 min    Activity Tolerance Patient tolerated treatment well    Behavior During Therapy [facility] for tasks assessed/performed           Past Medical History:  Diagnosis Date  . Depression   . Hepatitis A virus infection   . Kidney [place] 2017  . Kidney stones   . Sleep apnea    uses CPAP    Past Surgical History:  Procedure Laterality Date  . CHOLECYSTECTOMY N/A 09/02/2016   Procedure: LAPAROSCOPIC CHOLECYSTECTOMY WITH INTRAOPERATIVE CHOLANGIOGRAM;  Surgeon: Jackolyn Confer, MD;  Location: [facility];  Service: General;  Laterality: N/A;  . [place]    There were no vitals filed for this visit.   Subjective Assessment - 01/16/20 1605    Subjective Doing better overall since starting his exercises.    Currently in Pain? No/denies                             [facility] Adult PT Treatment/Exercise - 01/16/20 0001      Exercises   Exercises Knee/Hip      Knee/Hip Exercises: Aerobic   Nustep L5 x 6 min      Knee/Hip Exercises: Machines for Strengthening   Cybex Leg Press 40lb 2x10      Knee/Hip Exercises: Standing   Forward Step Up Both;2 sets;5 sets;Hand Hold: 0;Step Height: 6"    Walking with Sports Cord 40lb 4 way x3 each      Knee/Hip Exercises: Seated   Long Arc Quad Left;2 sets;10 reps;Strengthening    Long Arc Quad Weight 3 lbs.    Hamstring Curl Left;2 sets;15 reps    Hamstring Limitations green TBand    Sit to Sand 2 sets;10 reps;without UE support   holding  blue ball, LE on airex                   PT Short Term Goals - 01/16/20 1642      PT SHORT TERM GOAL #1   Title independent with initial HEP    Status Achieved             PT Long Term Goals - 01/16/20 1642      PT LONG TERM GOAL #1   Title go up and down stairs step over step    Status On-going      PT LONG TERM GOAL #2   Title report 50% less pain at night    Status On-going      PT LONG TERM GOAL #3   Title understand the body mechanics to avoid further injury    Status On-going                 Plan - 01/16/20 1643    Clinical Impression Statement Pt tolerated an initial progression to TE well. Some LLE weakness noted with step ups. Pt had good stability with resisted gait, cues  to keep hip square with resisted side steps. Pt tends to use RLE more on leg press. Pt stated some difficulty with stability with hamstring curls with Tband. Some L knee discomfort with sit to stands.    Stability/Clinical Decision Making Stable/Uncomplicated    Rehab Potential Good    PT Frequency 2x / week    PT Duration 8 weeks    PT Treatment/Interventions ADLs/Self Care Home Management;Gait training;Stair training;Functional mobility training;Therapeutic activities;Therapeutic exercise;Balance training;Neuromuscular re-education;Manual techniques;Patient/family education    PT Next Visit Plan 1x/week due to deductible, work on strength, better gait pattern and proprioception           Patient will benefit from skilled therapeutic intervention in order to improve the following deficits and impairments:  Abnormal gait,Decreased range of motion,Difficulty walking,Pain,Impaired flexibility,Decreased balance,Decreased strength  Visit Diagnosis: Acute pain of left knee  Difficulty in walking, not elsewhere classified     Problem List Patient Active Problem List   Diagnosis Date Noted  . GAD (generalized anxiety disorder) 02/16/2018  . Insomnia 02/16/2018  . RUQ  pain 09/02/2016  . OSA on CPAP 11/23/2014  . Depression 01/17/2014  . Evaluate for Obstructive sleep apnea 01/17/2014  . Hypersomnolence 01/17/2014  . Anxiety 01/17/2014    Scot Jun, PTA 01/16/2020, 4:48 PM  [address] [place], [place], [address] Phone: [phone]   Fax:  [phone]  Name: Sean Alexander MRN: [id number] Date of Birth: 1960-11-02

## 2020-01-23 ENCOUNTER — Ambulatory Visit: Payer: Commercial Managed Care - PPO | Admitting: Physical Therapy

## 2020-01-30 ENCOUNTER — Ambulatory Visit: Payer: Commercial Managed Care - PPO | Admitting: Physical Therapy

## 2020-01-30 ENCOUNTER — Other Ambulatory Visit: Payer: Self-pay

## 2020-01-30 ENCOUNTER — Encounter: Payer: Self-pay | Admitting: Physical Therapy

## 2020-01-30 DIAGNOSIS — M25562 Pain in left knee: Secondary | ICD-10-CM

## 2020-01-30 DIAGNOSIS — R262 Difficulty in walking, not elsewhere classified: Secondary | ICD-10-CM

## 2020-01-30 NOTE — Therapy (Signed)
[place]. [place], [place], [address] Phone: [phone]   Fax:  [phone]  Physical Therapy Treatment  Patient Details  Name: Sean Alexander MRN: [id number] Date of Birth: [birth year] Referring Provider (PT): Rosalie Gums Date: 01/30/2020    Past Medical History:  Diagnosis Date  . Depression   . Hepatitis A virus infection   . Kidney [place] 2017  . Kidney stones   . Sleep apnea    uses CPAP    Past Surgical History:  Procedure Laterality Date  . CHOLECYSTECTOMY N/A 09/02/2016   Procedure: LAPAROSCOPIC CHOLECYSTECTOMY WITH INTRAOPERATIVE CHOLANGIOGRAM;  Surgeon: Jackolyn Confer, MD;  Location: [facility];  Service: General;  Laterality: N/A;  . [place]    There were no vitals filed for this visit.   Subjective Assessment - 01/30/20 1603    Subjective "I think it is doing better" L knee did well 3 days ago after doing a lot of work in the garage    Currently in Pain? No/denies              [facility] PT Assessment - 01/30/20 0001      AROM   Left Knee Extension 1    Left Knee Flexion 118                         [facility] Adult PT Treatment/Exercise - 01/30/20 0001      Knee/Hip Exercises: Aerobic   Recumbent Bike L3 x min      Knee/Hip Exercises: Machines for Strengthening   Cybex Knee Extension 10lb 2x10    Cybex Knee Flexion 35lb 2x10    Cybex Leg Press 40lb 2x10      Knee/Hip Exercises: Standing   Heel Raises Both;2 sets;15 reps;2 seconds    Forward Step Up Both;2 sets;Hand Hold: 0;Step Height: 6";10 reps      Knee/Hip Exercises: Seated   Sit to Sand 2 sets;10 reps;without UE support                    PT Short Term Goals - 01/30/20 1642      PT SHORT TERM GOAL #1   Title independent with initial HEP    Status Achieved             PT Long Term Goals - 01/30/20 1642      PT LONG TERM GOAL #1   Title go up and down stairs step over step     Status Partially Met      PT LONG TERM GOAL #2   Title report 50% less pain at night    Status Achieved      PT LONG TERM GOAL #3   Title understand the body mechanics to avoid further injury    Status Partially Met      PT LONG TERM GOAL #4   Title walk without deviation    Status On-going                 Plan - 01/30/20 1644    Clinical Impression Statement Pt did well and is progressing towards goals. He has increased his L knee extension ROM. No reports of pain with today's interventions. Sit to stands were performed on airex, he did report that's his L knee did feel unstable with this.    Stability/Clinical Decision Making Stable/Uncomplicated    Rehab Potential Good    PT Frequency  2x / week    PT Duration 8 weeks    PT Treatment/Interventions ADLs/Self Care Home Management;Gait training;Stair training;Functional mobility training;Therapeutic activities;Therapeutic exercise;Balance training;Neuromuscular re-education;Manual techniques;Patient/family education    PT Next Visit Plan 1x/week due to deductible, work on strength, better gait pattern and proprioception           Patient will benefit from skilled therapeutic intervention in order to improve the following deficits and impairments:  Abnormal gait,Decreased range of motion,Difficulty walking,Pain,Impaired flexibility,Decreased balance,Decreased strength  Visit Diagnosis: Acute pain of left knee  Difficulty in walking, not elsewhere classified     Problem List Patient Active Problem List   Diagnosis Date Noted  . GAD (generalized anxiety disorder) 02/16/2018  . Insomnia 02/16/2018  . RUQ pain 09/02/2016  . OSA on CPAP 11/23/2014  . Depression 01/17/2014  . Evaluate for Obstructive sleep apnea 01/17/2014  . Hypersomnolence 01/17/2014  . Anxiety 01/17/2014    Scot Jun, PTA 01/30/2020, 4:46 PM  [place]. [place], [place], [address] Phone: [phone]   Fax:  [phone]  Name: Sean Alexander MRN: [id number] Date of Birth: April 11, 1960

## 2020-02-06 ENCOUNTER — Ambulatory Visit: Payer: Commercial Managed Care - PPO | Attending: Sports Medicine | Admitting: Physical Therapy

## 2020-02-06 ENCOUNTER — Encounter: Payer: Self-pay | Admitting: Physical Therapy

## 2020-02-06 ENCOUNTER — Other Ambulatory Visit: Payer: Self-pay

## 2020-02-06 DIAGNOSIS — M25562 Pain in left knee: Secondary | ICD-10-CM | POA: Insufficient documentation

## 2020-02-06 DIAGNOSIS — R262 Difficulty in walking, not elsewhere classified: Secondary | ICD-10-CM | POA: Diagnosis not present

## 2020-02-06 NOTE — Therapy (Signed)
[place]. [place], [place], [address] Phone: [phone]   Fax:  [phone]  Physical Therapy Treatment  Patient Details  Name: Sean Alexander MRN: [id number] Date of Birth: Jul 11, 1960 Referring Provider (PT): Rosalie Gums Date: 02/06/2020   PT End of Session - 02/06/20 1650    Visit Number 3    Date for PT Re-Evaluation 03/09/20    PT Start Time 1600    PT Stop Time 1645    PT Time Calculation (min) 45 min    Activity Tolerance Patient tolerated treatment well    Behavior During Therapy [facility] for tasks assessed/performed           Past Medical History:  Diagnosis Date  . Depression   . Hepatitis A virus infection   . Kidney [place] 2017  . Kidney stones   . Sleep apnea    uses CPAP    Past Surgical History:  Procedure Laterality Date  . CHOLECYSTECTOMY N/A 09/02/2016   Procedure: LAPAROSCOPIC CHOLECYSTECTOMY WITH INTRAOPERATIVE CHOLANGIOGRAM;  Surgeon: Jackolyn Confer, MD;  Location: [facility];  Service: General;  Laterality: N/A;  . [place]    There were no vitals filed for this visit.   Subjective Assessment - 02/06/20 1604    Subjective doing good, L knee continues to get warm after prolong L knee flexion    Currently in Pain? No/denies                             [facility] Adult PT Treatment/Exercise - 02/06/20 0001      Knee/Hip Exercises: Aerobic   Recumbent Bike L3 x min      Knee/Hip Exercises: Machines for Strengthening   Cybex Knee Extension 15lb LLE eccentrics 2x10    Cybex Knee Flexion 35lb 2x15    Cybex Leg Press 60lb 2x10      Knee/Hip Exercises: Standing   Walking with Sports Cord 50lb 4 way x3 each    Other Standing Knee Exercises Eccentric step downs 6 in 2x10                    PT Short Term Goals - 01/30/20 1642      PT SHORT TERM GOAL #1   Title independent with initial HEP    Status Achieved             PT Long Term  Goals - 02/06/20 1615      PT LONG TERM GOAL #2   Title report 50% less pain at night    Status Achieved                 Plan - 02/06/20 1650    Clinical Impression Statement Pt is doing well overall, reports some L knee warmth after prolong flexion Today's he completed all exercises but has L quad eccentric load weakness. Visible LE shaking with eccentric step downs and eccentric leg extensions. increase resistance tolerated on leg press.    Stability/Clinical Decision Making Stable/Uncomplicated    Rehab Potential Good    PT Frequency 2x / week    PT Duration 8 weeks    PT Treatment/Interventions ADLs/Self Care Home Management;Gait training;Stair training;Functional mobility training;Therapeutic activities;Therapeutic exercise;Balance training;Neuromuscular re-education;Manual techniques;Patient/family education    PT Next Visit Plan 1x/week due to deductible, work on strength, better gait pattern and proprioception           Patient  will benefit from skilled therapeutic intervention in order to improve the following deficits and impairments:  Abnormal gait,Decreased range of motion,Difficulty walking,Pain,Impaired flexibility,Decreased balance,Decreased strength  Visit Diagnosis: Difficulty in walking, not elsewhere classified  Acute pain of left knee     Problem List Patient Active Problem List   Diagnosis Date Noted  . GAD (generalized anxiety disorder) 02/16/2018  . Insomnia 02/16/2018  . RUQ pain 09/02/2016  . OSA on CPAP 11/23/2014  . Depression 01/17/2014  . Evaluate for Obstructive sleep apnea 01/17/2014  . Hypersomnolence 01/17/2014  . Anxiety 01/17/2014    Scot Jun, PTA 02/06/2020, 4:52 PM  [place]. [place], [place], [address] Phone: [phone]   Fax:  [phone]  Name: Sean Alexander MRN: [id number] Date of Birth: December 21, 1960

## 2020-02-14 ENCOUNTER — Encounter: Payer: Self-pay | Admitting: Physician Assistant

## 2020-02-14 ENCOUNTER — Ambulatory Visit (INDEPENDENT_AMBULATORY_CARE_PROVIDER_SITE_OTHER): Payer: Commercial Managed Care - PPO | Admitting: Physician Assistant

## 2020-02-14 ENCOUNTER — Other Ambulatory Visit: Payer: Self-pay

## 2020-02-14 DIAGNOSIS — F411 Generalized anxiety disorder: Secondary | ICD-10-CM

## 2020-02-14 DIAGNOSIS — F3341 Major depressive disorder, recurrent, in partial remission: Secondary | ICD-10-CM

## 2020-02-14 DIAGNOSIS — G4700 Insomnia, unspecified: Secondary | ICD-10-CM | POA: Diagnosis not present

## 2020-02-14 MED ORDER — ALPRAZOLAM 0.5 MG PO TABS: ORAL_TABLET | ORAL | 5 refills | Status: DC

## 2020-02-14 MED ORDER — VENLAFAXINE HCL ER 150 MG PO CP24: 150.0000 mg | ORAL_CAPSULE | Freq: Every day | ORAL | 5 refills | Status: DC

## 2020-02-14 NOTE — Progress Notes (Signed)
[facility] Med Check  Patient ID: Sean Alexander,  MRN: [id number]  PCP: Patient, No Pcp Per  Date of Evaluation: 02/14/2020 Time spent:20 minutes  Chief Complaint:  Chief Complaint    Anxiety; Depression      HISTORY/CURRENT STATUS: For routine med check.  Sean Alexander states he is doing very well.  Work is going fine and is very busy.  He sleeps well most of the time.  His biggest problem there is wanting to stay up late working on projects in the garage or something.  It is this way every year so it does not concern him.  He usually gets anywhere from 5 to 7 hours of sleep during the wintertime.  He has tried melatonin but does not think he needs something like that.  He just needs to go to bed earlier.  Patient denies loss of interest in usual activities and is able to enjoy things.  Denies decreased energy or motivation.  Appetite has not changed.  No extreme sadness, tearfulness, or feelings of hopelessness.  Denies any changes in concentration, making decisions or remembering things.  Anxiety is well controlled with the Xanax.  If he does have trouble going to sleep, the Xanax helps.  Denies suicidal or homicidal thoughts.  Patient denies increased energy with decreased need for sleep, no increased talkativeness, no racing thoughts, no impulsivity or risky behaviors, no increased spending, no increased libido, no grandiosity, no paranoia, and no AH/VH.  Denies dizziness, syncope, seizures, numbness, tingling, tremor, tics, unsteady gait, slurred speech, confusion. Denies muscle or joint pain, stiffness, or dystonia.  Individual Medical History/ Review of Systems: Changes? :No    Past medications for mental health diagnoses include: unknown  Allergies: Sulfa antibiotics and Penicillins  Current Medications:  Current Outpatient Medications:  .  Cranberry 500 MG CHEW, Chew 1 Units by mouth daily., Disp: , Rfl:  .  Multiple Vitamins-Minerals (CENTRUM ADULTS) TABS, Take 1 tablet by  mouth daily., Disp: , Rfl:  .  ALPRAZolam (XANAX) 0.5 MG tablet, TAKE 1/2 TO 1 TABLET BY MOUTH DAILY AS NEEDED, Disp: 30 tablet, Rfl: 5 .  aspirin 81 MG chewable tablet, Chew 81 mg by mouth daily. (Patient not taking: No sig reported), Disp: , Rfl:  .  oxyCODONE (OXY IR/ROXICODONE) 5 MG immediate release tablet, Take 1 tablet (5 mg total) by mouth every 4 (four) hours as needed for moderate pain or severe pain. (Patient not taking: No sig reported), Disp: 30 tablet, Rfl: 0 .  venlafaxine XR (EFFEXOR XR) 150 MG 24 hr capsule, Take 1 capsule (150 mg total) by mouth daily with breakfast., Disp: 30 capsule, Rfl: 5 Medication Side Effects: none  Family Medical/ Social History: Changes? No  MENTAL HEALTH EXAM:  There were no vitals taken for this visit.There is no height or weight on file to calculate BMI.  General Appearance: Casual, Neat and Well Groomed  Eye Contact:  Good  Speech:  Clear and Coherent and Normal Rate  Volume:  Normal  Mood:  Euthymic  Affect:  Appropriate  Thought Process:  Goal Directed  Orientation:  Full (Time, Place, and Person)  Thought Content: Logical   Suicidal Thoughts:  No  Homicidal Thoughts:  No  Memory:  WNL  Judgement:  Good  Insight:  Good  Psychomotor Activity:  Normal  Concentration:  Concentration: Good  Recall:  Good  Fund of Knowledge: Good  Language: Good  Assets:  Desire for Improvement  ADL's:  Intact  Cognition: WNL  Prognosis:  Good  DIAGNOSES:    ICD-10-CM   1. Recurrent major depressive disorder, in partial remission ([place])  F33.41   2. Generalized anxiety disorder  F41.1   3. Insomnia, unspecified type  G47.00     Receiving Psychotherapy: No  [name][facility] in the past.   RECOMMENDATIONS:  PDMP was reviewed. I provided 20 minutes of face-to-face time during this encounter, in which we discussed his response to the medications. He is doing really well so there is no need to make any changes. Continue Xanax 0.5 mg, 1/2-1  daily as needed. Continue Effexor XR 150 mg daily. Continue counseling with [name], [facility] as needed. Return in 6 months.  Donnal Moat, PA-C

## 2020-07-30 ENCOUNTER — Other Ambulatory Visit: Payer: Self-pay

## 2020-07-30 ENCOUNTER — Ambulatory Visit: Payer: Commercial Managed Care - PPO | Admitting: Cardiovascular Disease

## 2020-07-30 ENCOUNTER — Encounter: Payer: Self-pay | Admitting: Cardiovascular Disease

## 2020-07-30 DIAGNOSIS — Z9989 Dependence on other enabling machines and devices: Secondary | ICD-10-CM

## 2020-07-30 DIAGNOSIS — F32A Depression, unspecified: Secondary | ICD-10-CM

## 2020-07-30 DIAGNOSIS — F411 Generalized anxiety disorder: Secondary | ICD-10-CM

## 2020-07-30 DIAGNOSIS — G4733 Obstructive sleep apnea (adult) (pediatric): Secondary | ICD-10-CM

## 2020-07-30 DIAGNOSIS — R600 Localized edema: Secondary | ICD-10-CM

## 2020-07-30 MED ORDER — HYDROCHLOROTHIAZIDE 25 MG PO TABS: ORAL_TABLET | ORAL | 3 refills | Status: DC

## 2020-07-30 NOTE — Patient Instructions (Signed)
Medication Instructions:  BEGIN hydrochlorothiazide 12.'5mg'$  (1/2 tablet) to '25mg'$  (1 tablet) AS NEEDED for swelling.  *If you need a refill on your cardiac medications before your next appointment, please call your pharmacy*   Lab Work: FASTING - CMET, CBC, TSH, HgbA1C, Lipid.   If you have labs (blood work) drawn today and your tests are completely normal, you will receive your results only by: [place] (if you have MyChart) OR A paper copy in the mail If you have any lab test that is abnormal or we need to change your treatment, we will call you to review the results.   Testing/Procedures: Your physician has requested that you have an echocardiogram. Echocardiography is a painless test that uses sound waves to create images of your heart. It provides your doctor with information about the size and shape of your heart and how well your heart's chambers and valves are working. This procedure takes approximately one hour. There are no restrictions for this procedure.   Follow-Up: At [facility], you and your health needs are our priority.  As part of our continuing mission to provide you with exceptional heart care, we have created designated Provider Care Teams.  These Care Teams include your primary Cardiologist (physician) and Advanced Practice Providers (APPs -  Physician Assistants and Nurse Practitioners) who all work together to provide you with the care you need, when you need it.  We recommend signing up for the patient portal called "MyChart".  Sign up information is provided on this After Visit Summary.  MyChart is used to connect with patients for Virtual Visits (Telemedicine).  Patients are able to view lab/test results, encounter notes, upcoming appointments, etc.  Non-urgent messages can be sent to your provider as well.   To learn more about what you can do with MyChart, go to [email or web address].    Your next appointment:   2-3 month(s)  The format for your  next appointment:   In Person  Provider:   Shelva Majestic, MD

## 2020-07-30 NOTE — Progress Notes (Signed)
Cardiology Office Note    Date:  08/03/2020   ID:  Sean Alexander, DOB 09-Dec-1960, MRN [id number]  PCP:  Patient, No Pcp Per (Inactive)  Cardiologist:  Shelva Majestic, MD   New cardiology evaluation to establish care   History of Present Illness:  Sean Alexander is a 60 y.o. male who presents to the office today to establish cardiology care.  Sean Alexander has a history of anxiety and depression and has been on Effexor for over 25 years and alprazolam.  I had seen him in 2016.  At that time he was working at [employer] and was having significant sleep issues.  He typically was working in the afternoon until the early morning and had issues of snoring, nonrestorative sleep, nocturia, and was referred for a sleep study.  He was found to have mild obstructive sleep apnea AHI 5.3R DI of 7.3 but there was absence of REM sleep on his evaluation.  He had significant desaturation to a nadir of 80%.  CPAP was initiated at 8 cm.  I saw him in follow-up on November 21, 2014 at which time his compliance was excellent.  I have not seen Sean Alexander since 2016.  He would like now to establish cardiology care with me.  He has continued to use CPAP therapy and has his initial machine which is now no longer able to be wireless due to the upgrade to [employer] and his machine is only 3G.  He continues to be on Effexor for depression and takes alprazolam as needed.  He denies any chest pain.  He denies any PND orthopnea.  He is unaware of palpitations, presyncope or syncope.  At times blood pressure is mildly elevated.  He has not had any recent laboratory and presents to establish care with me.   Past Medical History:  Diagnosis Date   Depression    Hepatitis A virus infection    Kidney Salvato 2017   Kidney stones    Sleep apnea    uses CPAP    Past Surgical History:  Procedure Laterality Date   CHOLECYSTECTOMY N/A 09/02/2016   Procedure: LAPAROSCOPIC CHOLECYSTECTOMY WITH INTRAOPERATIVE CHOLANGIOGRAM;  Surgeon:  Jackolyn Confer, MD;  Location: [facility];  Service: General;  Laterality: N/A;   HERNIA REPAIR  1986    Current Medications: Outpatient Medications Prior to Visit  Medication Sig Dispense Refill   ALPRAZolam (XANAX) 0.5 MG tablet TAKE 1/2 TO 1 TABLET BY MOUTH DAILY AS NEEDED 30 tablet 5   aspirin 81 MG chewable tablet Chew 81 mg by mouth daily.     Cranberry 500 MG CHEW Chew 1 Units by mouth daily.     Multiple Vitamins-Minerals (CENTRUM ADULTS) TABS Take 1 tablet by mouth daily.     venlafaxine XR (EFFEXOR XR) 150 MG 24 hr capsule Take 1 capsule (150 mg total) by mouth daily with breakfast. 30 capsule 5   oxyCODONE (OXY IR/ROXICODONE) 5 MG immediate release tablet Take 1 tablet (5 mg total) by mouth every 4 (four) hours as needed for moderate pain or severe pain. (Patient not taking: Reported on 07/30/2020) 30 tablet 0   No facility-administered medications prior to visit.     Allergies:   Sulfa antibiotics and Penicillins   Social History   Socioeconomic History   Marital status: Married    Spouse name: Mickel Baas   Number of children: 0   Years of education: Not on file   Highest education level: Not on file  Occupational History  Occupation: Armed forces technical officer  Tobacco Use   Smoking status: Never   Smokeless tobacco: Never  Vaping Use   Vaping Use: Never used  Substance and Sexual Activity   Alcohol use: No    Alcohol/week: 0.0 standard drinks   Drug use: No   Sexual activity: Not on file  Other Topics Concern   Not on file  Social History Narrative   Not on file   Social Determinants of Health   Financial Resource Strain: Not on file  Food Insecurity: Not on file  Transportation Needs: Not on file  Physical Activity: Not on file  Stress: Not on file  Social Connections: Not on file    Socially, he lives in [place] but was born in [place].  He got an associate degree from [employer] in aviation maintenance.  He works at [employer].  He is married since  22.  No children.  There is no tobacco history.  Family History:  The patient's family history includes Diabetes in his brother; Heart disease in his father and mother.  His mother died at age 58 with heart failure.  Father died at age 88 and had suffered 2 CVAs.  1 brother died in a boating accident and had diabetes.  1 sister died with lung cancer and was a heavy smoker.  He has 3 living brothers all smoke.  He is the only one of his family who did not smoke.  ROS General: Negative; No fevers, chills, or night sweats;  HEENT: Negative; No changes in vision or hearing, sinus congestion, difficulty swallowing Pulmonary: Negative; No cough, wheezing, shortness of breath, hemoptysis Cardiovascular: Negative; No chest pain, presyncope, syncope, palpitations GI: Negative; No nausea, vomiting, diarrhea, or abdominal pain GU: Negative; No dysuria, hematuria, or difficulty voiding Musculoskeletal: Negative; no myalgias, joint pain, or weakness Hematologic/Oncology: Negative; no easy bruising, bleeding Endocrine: Negative; no heat/cold intolerance; no diabetes Neuro: Negative; no changes in balance, headaches Skin: Negative; No rashes or skin lesions Psychiatric: Negative; No behavioral problems, depression Sleep: Positive for OSA on CPAP therapy set up date 2016; unaware of breakthrough snoring,  no hypersomnolence, bruxism, restless legs, hypnogognic hallucinations, no cataplexy Other comprehensive 14 point system review is negative.   PHYSICAL EXAM:   VS:  BP (!) 150/82   Pulse 64   Ht 5' 10" (1.778 m)   Wt 225 lb (102.1 kg)   SpO2 98%   BMI 32.28 kg/m     Repeat blood pressure by me was 126/80  Wt Readings from Last 3 Encounters:  07/30/20 225 lb (102.1 kg)  09/02/16 212 lb (96.2 kg)  12/24/15 203 lb (92.1 kg)    General: Alert, oriented, no distress.  Skin: normal turgor, no rashes, warm and dry HEENT: Normocephalic, atraumatic. Pupils equal round and reactive to light; sclera  anicteric; extraocular muscles intact;  Nose without nasal septal hypertrophy Mouth/Parynx benign; Mallinpatti scale 3 Neck: No JVD, no carotid bruits; normal carotid upstroke Lungs: clear to ausculatation and percussion; no wheezing or rales Chest wall: without tenderness to palpitation Heart: PMI not displaced, RRR, s1 s2 normal, 1/6 systolic murmur, no diastolic murmur, no rubs, gallops, thrills, or heaves Abdomen: soft, nontender; no hepatosplenomehaly, BS+; abdominal aorta nontender and not dilated by palpation. Back: no CVA tenderness Pulses 2+ Musculoskeletal: full range of motion, normal strength, no joint deformities Extremities: Trace to mild edema from the lower pretibial region to the feet no clubbing cyanosis , Homan's sign negative  Neurologic: grossly nonfocal; Cranial nerves grossly wnl  Psychologic: Normal mood and affect   Studies/Labs Reviewed:   EKG:  EKG is ordered today. ECG (independently read by me):  NSR at 64; no ectopy, no ST changes, normal intervals  Recent Labs: BMP Latest Ref Rng & Units 09/03/2016 09/02/2016 12/22/2015  Glucose 65 - 99 mg/dL 127(H) 178(H) 103(H)  BUN 6 - 20 mg/dL _0 Creatinine 0.61 - 1.24 mg/dL 0.83 0.93 0.80  Sodium 135 - 145 mmol/L 136 136 138  Potassium 3.5 - 5.1 mmol/L 4.1 3.7 4.0  Chloride 101 - 111 mmol/L 105 104 104  CO2 22 - 32 mmol/L _1 Calcium 8.9 - 10.3 mg/dL 8.9 8.8(L) 9.0     Hepatic Function Latest Ref Rng & Units 09/03/2016 09/02/2016 09/16/2015  Total Protein 6.5 - 8.1 g/dL 6.5 7.0 8.1  Albumin 3.5 - 5.0 g/dL 3.6 4.3 4.5  AST 15 - 41 U/L 39 25 22  ALT 17 - 63 U/L 44 29 22  Alk Phosphatase 38 - 126 U/L 51 55 69  Total Bilirubin 0.3 - 1.2 mg/dL 0.9 0.5 0.8    CBC Latest Ref Rng & Units 09/02/2016 12/22/2015 09/16/2015  WBC 4.0 - 10.5 K/uL 10.6(H) 6.5 6.8  Hemoglobin 13.0 - 17.0 g/dL 12.4(L) 12.5(L) 14.6  Hematocrit 39.0 - 52.0 % 35.5(L) 36.6(L) 42.3  Platelets 150 - 400 K/uL 171 174 184   Lab Results   Component Value Date   MCV 91.5 09/02/2016   MCV 92.4 12/22/2015   MCV 90.2 09/16/2015   No results found for: TSH Lab Results  Component Value Date   HGBA1C 5.5 09/02/2016     BNP No results found for: BNP  ProBNP No results found for: PROBNP   Lipid Panel  No results found for: CHOL, TRIG, HDL, CHOLHDL, VLDL, LDLCALC, LDLDIRECT, LABVLDL   RADIOLOGY: No results found.   Additional studies/ records that were reviewed today include:   Records were reviewed from 2016.   ASSESSMENT:    1. OSA on CPAP   2. Depression, unspecified depression type   3. Bilateral leg edema   4. GAD (generalized anxiety disorder)      PLAN:  Mr. Sean Resides "[name]" Alexander is a 60 year old gentleman who has a longstanding history of depression and has been on Effexor for over 25 years followed by Dr. Candis Schatz.  In 2016, he had symptoms highly suggestive of obstructive sleep apnea with loud snoring, nonrestorative sleep, daytime sleepiness, as well as witnessed apneic spells and at times awakening gasping for breath.  He underwent a sleep evaluation and during that baseline study he was never able to achieve any REM sleep.  Overall AHI was 5.3 and RDI 7.3/h but is the severity of sleep apnea may have been underestimated since REM sleep did not develop.  He had significant oxygen desaturation to a nadir of 80%.  He has continued to be on CPAP therapy since initiation in 2016.  His ResMed air sense 10 AutoSet unit is now unfortunately probably no longer wireless but we will try to link his office for our review.  He qualifies for a new machine since his machine is over 76 years old.  I discussed with him the supply chain issues predominantly due to multiple chips and the new CPAP machines.  We will try to contact his DME company to make certain he is on a list to receive a new ResMed air sense 11 AutoSet unit.  He has noticed some intermittent leg swelling from the pretibial region  downward to his feet and  I am giving him a prescription to take HCTZ 12.5 mg on a as needed basis.  His blood pressure otherwise is controlled..  He has not had any laboratory in several years.  I have recommended fasting laboratory with a comprehensive metabolic panel, CBC, hemoglobin A1c, TSH and lipid studies.  With his sleep apnea, I am also recommending a baseline echo Doppler study for assessment of LV systolic and diastolic function and pulmonary pressure.  He has mild obesity with a BMI of 32.28.  I discussed the importance of weight loss.  I discussed the importance of exercise with recommendations of at least 5 days/week for 30 minutes of moderate intensity if at all possible.  His ECG is stable and is without ectopy and with normal intervals.  I will see him in 2 to 3 months for follow-up evaluation or sooner as needed.   Medication Adjustments/Labs and Tests Ordered: Current medicines are reviewed at length with the patient today.  Concerns regarding medicines are outlined above.  Medication changes, Labs and Tests ordered today are listed in the Patient Instructions below. Patient Instructions  Medication Instructions:  BEGIN hydrochlorothiazide 12.85m (1/2 tablet) to 24m(1 tablet) AS NEEDED for swelling.  *If you need a refill on your cardiac medications before your next appointment, please call your pharmacy*   Lab Work: FASTING - CMET, CBC, TSH, HgbA1C, Lipid.   If you have labs (blood work) drawn today and your tests are completely normal, you will receive your results only by: MyPimaif you have MyChart) OR A paper copy in the mail If you have any lab test that is abnormal or we need to change your treatment, we will call you to review the results.   Testing/Procedures: Your physician has requested that you have an echocardiogram. Echocardiography is a painless test that uses sound waves to create images of your heart. It provides your doctor with information about the size and shape of  your heart and how well your heart's chambers and valves are working. This procedure takes approximately one hour. There are no restrictions for this procedure.   Follow-Up: [facility].  As part of our continuing mission to provide you with exceptional heart care, we have created designated Provider Care Teams.  These Care Teams include your primary Cardiologist (physician) and Advanced Practice Providers (APPs -  Physician Assistants and Nurse Practitioners) who all work together to provide you with the care you need, when you need it.  We recommend signing up for the patient portal called "MyChart".  Sign up information is provided on this After Visit Summary.  MyChart is used to connect with patients for Virtual Visits (Telemedicine).  Patients are able to view lab/test results, encounter notes, upcoming appointments, etc.  Non-urgent messages can be sent to your provider as well.   To learn more about what you can do with MyChart, go to [email or web address]   Your next appointment:   2-3 month(s)  The format for your next appointment:   In Person  Provider:   ThShelva MajesticMD      Signed, ThShelva MajesticMD  08/03/2020 6:34 PM    [place]  [id number]: [phone]

## 2020-08-03 ENCOUNTER — Encounter: Payer: Self-pay | Admitting: Cardiovascular Disease

## 2020-08-15 ENCOUNTER — Other Ambulatory Visit: Payer: Self-pay

## 2020-08-15 ENCOUNTER — Encounter: Payer: Self-pay | Admitting: Physician Assistant

## 2020-08-15 ENCOUNTER — Ambulatory Visit (INDEPENDENT_AMBULATORY_CARE_PROVIDER_SITE_OTHER): Payer: Commercial Managed Care - PPO | Admitting: Physician Assistant

## 2020-08-15 DIAGNOSIS — F411 Generalized anxiety disorder: Secondary | ICD-10-CM | POA: Diagnosis not present

## 2020-08-15 DIAGNOSIS — G4700 Insomnia, unspecified: Secondary | ICD-10-CM

## 2020-08-15 DIAGNOSIS — F3342 Major depressive disorder, recurrent, in full remission: Secondary | ICD-10-CM | POA: Diagnosis not present

## 2020-08-15 MED ORDER — VENLAFAXINE HCL ER 150 MG PO CP24: 150.0000 mg | ORAL_CAPSULE | Freq: Every day | ORAL | 5 refills | Status: DC

## 2020-08-15 MED ORDER — ALPRAZOLAM 0.5 MG PO TABS: ORAL_TABLET | ORAL | 5 refills | Status: DC

## 2020-08-15 NOTE — Progress Notes (Signed)
[facility] Med Check  Patient ID: Sean Alexander,  MRN: [id number]  PCP: Patient, No Pcp Per (Inactive)  Date of Evaluation: 08/15/2020  time spent:20 minutes  Chief Complaint:  Chief Complaint   Anxiety; Depression; Follow-up     HISTORY/CURRENT STATUS: For routine med check.  Sean Alexander is doing really well.  Medications continue to help with his mood.  Work is good.  He is still at [employer].  Not missing any work.  He sleeps well.  He does use the Xanax in the evening more so than any other time just to help him relax and be able to go to sleep.  Patient denies loss of interest in usual activities and is able to enjoy things.  Denies decreased energy or motivation.  Appetite has not changed.  No extreme sadness, tearfulness, or feelings of hopelessness.  Denies any changes in concentration, making decisions or remembering things.  Denies suicidal or homicidal thoughts.  Denies dizziness, syncope, seizures, numbness, tingling, tremor, tics, unsteady gait, slurred speech, confusion. Denies muscle or joint pain, stiffness, or dystonia.  Individual Medical History/ Review of Systems: Changes? :No    Past medications for mental health diagnoses include: unknown  Allergies: Sulfa antibiotics and Penicillins  Current Medications:  Current Outpatient Medications:    aspirin 81 MG chewable tablet, Chew 81 mg by mouth daily., Disp: , Rfl:    Cranberry 500 MG CHEW, Chew 1 Units by mouth daily., Disp: , Rfl:    hydrochlorothiazide (HYDRODIURIL) 25 MG tablet, Take 12.'5mg'$  (1/2 tablet) to '25mg'$  (1 tablet) as needed for swelling., Disp: 30 tablet, Rfl: 3   Multiple Vitamins-Minerals (CENTRUM ADULTS) TABS, Take 1 tablet by mouth daily., Disp: , Rfl:    ALPRAZolam (XANAX) 0.5 MG tablet, TAKE 1/2 TO 1 TABLET BY MOUTH DAILY AS NEEDED, Disp: 30 tablet, Rfl: 5   venlafaxine XR (EFFEXOR XR) 150 MG 24 hr capsule, Take 1 capsule (150 mg total) by mouth daily with breakfast., Disp: 30 capsule, Rfl:  5 Medication Side Effects: none  Family Medical/ Social History: Changes? No  MENTAL HEALTH EXAM:  There were no vitals taken for this visit.There is no height or weight on file to calculate BMI.  General Appearance: Casual, Neat and Well Groomed  Eye Contact:  Good  Speech:  Clear and Coherent and Normal Rate  Volume:  Normal  Mood:  Euthymic  Affect:  Appropriate  Thought Process:  Goal Directed and Descriptions of Associations: Circumstantial  Orientation:  Full (Time, Place, and Person)  Thought Content: Logical   Suicidal Thoughts:  No  Homicidal Thoughts:  No  Memory:  WNL  Judgement:  Good  Insight:  Good  Psychomotor Activity:  Normal  Concentration:  Concentration: Good  Recall:  Good  Fund of Knowledge: Good  Language: Good  Assets:  Desire for Improvement  ADL's:  Intact  Cognition: WNL  Prognosis:  Good    DIAGNOSES:    ICD-10-CM   1. Major depression, recurrent, full remission ([place])  F33.42     2. Generalized anxiety disorder  F41.1     3. Insomnia, unspecified type  G47.00        Receiving Psychotherapy: No  [name], [facility] in the past.   RECOMMENDATIONS:  PDMP was reviewed. Xanax I provided 20 minutes of face to face time during this encounter, including time spent before and after the visit in records review, medical decision making, and charting.  I am glad to see him doing so well.  No medication  changes are necessary. Continue Xanax 0.5 mg, 1/2-1 daily as needed. Continue Effexor XR 150 mg daily. Return in 6 months.  Donnal Moat, PA-C

## 2020-08-24 LAB — LIPID PANEL
Chol/HDL Ratio: 7.2 ratio — ABNORMAL HIGH (ref 0.0–5.0)
Cholesterol, Total: 201 mg/dL — ABNORMAL HIGH (ref 100–199)
HDL: 28 mg/dL — ABNORMAL LOW (ref >39)
LDL Chol Calc (NIH): 113 mg/dL — ABNORMAL HIGH (ref 0–99)
Triglycerides: 342 mg/dL — ABNORMAL HIGH (ref 0–149)
VLDL Cholesterol Cal: 60 mg/dL — ABNORMAL HIGH (ref 5–40)

## 2020-08-24 LAB — HEMOGLOBIN A1C
Est. average glucose Bld gHb Est-mCnc: 123 mg/dL
Hgb A1c MFr Bld: 5.9 % — ABNORMAL HIGH (ref 4.8–5.6)

## 2020-08-24 LAB — CBC
Hematocrit: 40.8 % (ref 37.5–51.0)
Hemoglobin: 14.1 g/dL (ref 13.0–17.7)
MCH: 31.1 pg (ref 26.6–33.0)
MCHC: 34.6 g/dL (ref 31.5–35.7)
MCV: 90 fL (ref 79–97)
Platelets: 187 10*3/uL (ref 150–450)
RBC: 4.53 x10E6/uL (ref 4.14–5.80)
RDW: 13 % (ref 11.6–15.4)
WBC: 6.3 10*3/uL (ref 3.4–10.8)

## 2020-08-24 LAB — COMPREHENSIVE METABOLIC PANEL
ALT: 46 IU/L — ABNORMAL HIGH (ref 0–44)
AST: 30 IU/L (ref 0–40)
Albumin/Globulin Ratio: 1.7 (ref 1.2–2.2)
Albumin: 4.3 g/dL (ref 3.8–4.9)
Alkaline Phosphatase: 100 IU/L (ref 44–121)
BUN/Creatinine Ratio: 16 (ref 9–20)
BUN: 14 mg/dL (ref 6–24)
Bilirubin Total: 0.3 mg/dL (ref 0.0–1.2)
CO2: 24 mmol/L (ref 20–29)
Calcium: 9.4 mg/dL (ref 8.7–10.2)
Chloride: 104 mmol/L (ref 96–106)
Creatinine, Ser: 0.9 mg/dL (ref 0.76–1.27)
Globulin, Total: 2.6 g/dL (ref 1.5–4.5)
Glucose: 94 mg/dL (ref 65–99)
Potassium: 4.9 mmol/L (ref 3.5–5.2)
Sodium: 142 mmol/L (ref 134–144)
Total Protein: 6.9 g/dL (ref 6.0–8.5)
eGFR: 99 mL/min/{1.73_m2} (ref >59)

## 2020-08-24 LAB — TSH: TSH: 3.29 u[IU]/mL (ref 0.450–4.500)

## 2020-08-26 ENCOUNTER — Ambulatory Visit (HOSPITAL_COMMUNITY): Payer: Commercial Managed Care - PPO | Attending: Cardiovascular Disease

## 2020-08-26 ENCOUNTER — Other Ambulatory Visit: Payer: Self-pay

## 2020-08-26 DIAGNOSIS — Z9989 Dependence on other enabling machines and devices: Secondary | ICD-10-CM | POA: Diagnosis not present

## 2020-08-26 DIAGNOSIS — G4733 Obstructive sleep apnea (adult) (pediatric): Secondary | ICD-10-CM | POA: Diagnosis not present

## 2020-08-26 DIAGNOSIS — R600 Localized edema: Secondary | ICD-10-CM

## 2020-08-26 LAB — ECHOCARDIOGRAM COMPLETE
Area-P 1/2: 3.5 cm2
S' Lateral: 2.5 cm

## 2020-08-29 ENCOUNTER — Other Ambulatory Visit: Payer: Self-pay

## 2020-08-29 DIAGNOSIS — E785 Hyperlipidemia, unspecified: Secondary | ICD-10-CM

## 2020-08-29 MED ORDER — ROSUVASTATIN CALCIUM 20 MG PO TABS: 20.0000 mg | ORAL_TABLET | Freq: Every day | ORAL | 3 refills | Status: DC

## 2020-08-29 MED ORDER — ICOSAPENT ETHYL 1 G PO CAPS: 2.0000 g | ORAL_CAPSULE | Freq: Two times a day (BID) | ORAL | 2 refills | Status: DC

## 2020-10-03 ENCOUNTER — Other Ambulatory Visit: Payer: Self-pay

## 2020-10-03 MED ORDER — ROSUVASTATIN CALCIUM 20 MG PO TABS
20.0000 mg | ORAL_TABLET | Freq: Every day | ORAL | 3 refills | Status: DC
Start: 2020-10-03 — End: 2022-02-13

## 2020-10-03 MED ORDER — ICOSAPENT ETHYL 1 G PO CAPS: 2.0000 g | ORAL_CAPSULE | Freq: Two times a day (BID) | ORAL | 2 refills | Status: DC

## 2020-10-17 ENCOUNTER — Other Ambulatory Visit: Payer: Self-pay

## 2020-10-17 ENCOUNTER — Emergency Department (HOSPITAL_BASED_OUTPATIENT_CLINIC_OR_DEPARTMENT_OTHER)
Admission: EM | Admit: 2020-10-17 | Discharge: 2020-10-17 | Disposition: A | Discharge status: Home / Self Care | Payer: Commercial Managed Care - PPO | Attending: Emergency Medicine | Admitting: Emergency Medicine

## 2020-10-17 ENCOUNTER — Encounter (HOSPITAL_BASED_OUTPATIENT_CLINIC_OR_DEPARTMENT_OTHER): Payer: Self-pay

## 2020-10-17 ENCOUNTER — Emergency Department (HOSPITAL_BASED_OUTPATIENT_CLINIC_OR_DEPARTMENT_OTHER): Payer: Commercial Managed Care - PPO

## 2020-10-17 DIAGNOSIS — K5900 Constipation, unspecified: Secondary | ICD-10-CM | POA: Diagnosis not present

## 2020-10-17 DIAGNOSIS — R109 Unspecified abdominal pain: Secondary | ICD-10-CM | POA: Diagnosis present

## 2020-10-17 DIAGNOSIS — Z7982 Long term (current) use of aspirin: Secondary | ICD-10-CM | POA: Diagnosis not present

## 2020-10-17 NOTE — ED Triage Notes (Addendum)
Pt arrives ambulatory to ED with c/o constipation states that he does suffer with constipation but went to have BM today was unable to do so, strained, tried showering, went to UC. Pt was given a miralx and enema to take home but came to ED. Did have XR and was told he did bot have a blockage. Last BM Sunday. Pt also reports no urine output.

## 2020-10-17 NOTE — ED Provider Notes (Signed)
[facility] Provider Note   CSN: [id number] Arrival date & time: 10/17/20  1134     History Chief Complaint  Patient presents with   Constipation    Sean Alexander is a 60 y.o. male with past medical history significant for anxiety, depression, previous gallbladder surgery laparoscopically in 2017, and abdominal hernia repair in the 80s who presents with 4 to 5 days of constipation with obstipation, and abdominal pain.  Patient also reports that he has had no urine output since yesterday around noon.  Patient denies any numbness or tingling in his groin, no back pain, or recent back injury.  Patient has had no difficulty with gait.  Patient reports that he has been eating a normal to a more healthy diet for him recently secondary to his wife's open heart surgery, however he has not been drinking very much water, not been getting very much sleep.  Patient denies any nausea, vomiting.  Patient was seen by urgent care earlier this morning and reports that they did not see a blockage on his abdominal x-ray, we do not have any evidence of an abdominal x-ray on file.  Patient has not tried MiraLAX, increase fluid or fiber intake, or enema.  Patient does report that he can palpate a hard stool ball when he attempts to disimpact himself in the shower.  Patient has not used or taken any narcotics.   Constipation     Past Medical History:  Diagnosis Date   Depression    Hepatitis A virus infection    Kidney Bouton 2017   Kidney stones    Sleep apnea    uses CPAP    Patient Active Problem List   Diagnosis Date Noted   GAD (generalized anxiety disorder) 02/16/2018   Insomnia 02/16/2018   RUQ pain 09/02/2016   OSA on CPAP 11/23/2014   Depression 01/17/2014   Evaluate for Obstructive sleep apnea 01/17/2014   Hypersomnolence 01/17/2014   Anxiety 01/17/2014    Past Surgical History:  Procedure Laterality Date   CHOLECYSTECTOMY N/A 09/02/2016   Procedure:  LAPAROSCOPIC CHOLECYSTECTOMY WITH INTRAOPERATIVE CHOLANGIOGRAM;  Surgeon: Jackolyn Confer, MD;  Location: [facility];  Service: General;  Laterality: N/A;   HERNIA REPAIR  1986       Family History  Problem Relation Age of Onset   Heart disease Mother    Heart disease Father    Diabetes Brother    Colon cancer Neg Hx    Colon polyps Neg Hx     Social History   Tobacco Use   Smoking status: Never   Smokeless tobacco: Never  Vaping Use   Vaping Use: Never used  Substance Use Topics   Alcohol use: No    Alcohol/week: 0.0 standard drinks   Drug use: No    Home Medications Prior to Admission medications   Medication Sig Start Date End Date Taking? Authorizing Provider  ALPRAZolam Duanne Moron) 0.5 MG tablet TAKE 1/2 TO 1 TABLET BY MOUTH DAILY AS NEEDED 08/15/20   Donnal Moat T, PA-C  aspirin 81 MG chewable tablet Chew 81 mg by mouth daily.    [provider]  Cranberry 500 MG CHEW Chew 1 Units by mouth daily.    [provider]  hydrochlorothiazide (HYDRODIURIL) 25 MG tablet Take 12.5mg  (1/2 tablet) to 25mg  (1 tablet) as needed for swelling. 07/30/20   Troy Sine, MD  icosapent Ethyl (VASCEPA) 1 g capsule Take 2 capsules (2 g total) by mouth 2 (two) times daily.  10/03/20   Troy Sine, MD  Multiple Vitamins-Minerals (CENTRUM ADULTS) TABS Take 1 tablet by mouth daily.    [provider]  rosuvastatin (CRESTOR) 20 MG tablet Take 1 tablet (20 mg total) by mouth daily. 10/03/20 01/01/21  Troy Sine, MD  venlafaxine XR (EFFEXOR XR) 150 MG 24 hr capsule Take 1 capsule (150 mg total) by mouth daily with breakfast. 08/15/20   Donnal Moat T, PA-C    Allergies    Sulfa antibiotics and Penicillins  Review of Systems   Review of Systems  Gastrointestinal:  Positive for constipation.  Genitourinary:  Positive for decreased urine volume.  All other systems reviewed and are negative.  Physical Exam Updated Vital Signs BP (!) 141/95 (BP Location: Right  Arm)   Pulse 79   Temp 97.9 F (36.6 C) (Oral)   Resp 16   Ht 5\' 11"  (1.803 m)   Wt 96.2 kg   SpO2 96%   BMI 29.57 kg/m   Physical Exam Vitals and nursing note reviewed.  Constitutional:      General: He is not in acute distress.    Appearance: Normal appearance.  HENT:     Head: Normocephalic and atraumatic.  Eyes:     General:        Right eye: No discharge.        Left eye: No discharge.  Cardiovascular:     Rate and Rhythm: Normal rate and regular rhythm.     Heart sounds: No murmur heard.   No friction rub. No gallop.  Pulmonary:     Effort: Pulmonary effort is normal.     Breath sounds: Normal breath sounds.  Abdominal:     General: Bowel sounds are normal.     Palpations: Abdomen is soft.     Comments: Generalized tenderness to palpation of the entire abdomen, no focal tenderness.  No rebound, rigidity, guarding, masses palpated.  Skin:    General: Skin is warm and dry.     Capillary Refill: Capillary refill takes less than 2 seconds.  Neurological:     Mental Status: He is alert and oriented to person, place, and time.  Psychiatric:        Mood and Affect: Mood normal.        Behavior: Behavior normal.    ED Results / Procedures / Treatments   Labs (all labs ordered are listed, but only abnormal results are displayed) Labs Reviewed - No data to display  EKG None  Radiology No results found.  Procedures Procedures   Medications Ordered in ED Medications - No data to display  ED Course  I have reviewed the triage vital signs and the nursing notes.  Pertinent labs & imaging results that were available during my care of the patient were reviewed by me and considered in my medical decision making (see chart for details).    MDM Rules/Calculators/A&P                         With 4 to 5 days of constipation.  Patient with 400 450 mL in bladder on bladder scan.  Plan to repeat abdominal x-ray to check for blockage since we do not have file from  urgent care, potentially performing in and out catheterization if patient is unable to urinate, obtain that lab work to check for kidney function.  While waiting to begin lab work, work-up patient was able to have a large bowel movement, as well as  urinate.  Patient reports that he feels a lot of relief, feels that he fully emptied his bladder.  Patient did not have any blood in his urine, or in his stool.  Patient discharged in stable condition at this time, no lab work performed based on overall normal history, physical exam, and resolution of patient's chief complaint.  Discussed that I encourage increased fiber intake, increase fluid intake, and MiraLAX as needed to prevent frequent straining as frequent straining could lead to hemorrhoids, and other complications.  Discussed if patient continues to have chronic constipation recommend follow-up with primary care, or GI with possible evaluation via colonoscopy.  Patient discharged in stable condition.  Return precautions are given. Final Clinical Impression(s) / ED Diagnoses Final diagnoses:  Constipation, unspecified constipation type    Rx / DC Orders ED Discharge Orders     None        Dorien Chihuahua 10/17/20 1323    Hayden Rasmussen, MD 10/17/20 1750

## 2020-10-28 ENCOUNTER — Ambulatory Visit: Payer: Commercial Managed Care - PPO | Admitting: Cardiovascular Disease

## 2020-10-28 ENCOUNTER — Other Ambulatory Visit: Payer: Self-pay

## 2020-10-28 ENCOUNTER — Encounter: Payer: Self-pay | Admitting: Cardiovascular Disease

## 2020-10-28 DIAGNOSIS — I77810 Thoracic aortic ectasia: Secondary | ICD-10-CM | POA: Diagnosis not present

## 2020-10-28 DIAGNOSIS — F32A Depression, unspecified: Secondary | ICD-10-CM

## 2020-10-28 DIAGNOSIS — E785 Hyperlipidemia, unspecified: Secondary | ICD-10-CM

## 2020-10-28 DIAGNOSIS — G4733 Obstructive sleep apnea (adult) (pediatric): Secondary | ICD-10-CM

## 2020-10-28 DIAGNOSIS — E782 Mixed hyperlipidemia: Secondary | ICD-10-CM | POA: Diagnosis not present

## 2020-10-28 DIAGNOSIS — Z9989 Dependence on other enabling machines and devices: Secondary | ICD-10-CM

## 2020-10-28 DIAGNOSIS — R600 Localized edema: Secondary | ICD-10-CM

## 2020-10-28 NOTE — Progress Notes (Signed)
Cardiology Office Note    Date:  10/30/2020   ID:  Sean Alexander, DOB 1960/04/11, MRN [id number]  PCP:  Patient, No Pcp Per (Inactive)  Cardiologist:  Shelva Majestic, MD   74-monthf/u cardiology evaluation    History of Present Illness:  Sean CALABRETTAis a 60y.o. male who I saw on July 30, 2020 to re-establish cardiology care.  He presents for follow-up evaluation.  Mr. SNixonhas a history of anxiety and depression and has been on Effexor for over 25 years and alprazolam.  I had seen him in 2016.  At that time he was working at [employer] was having significant sleep issues.  He typically was working in the afternoon until the early morning and had issues of snoring, nonrestorative sleep, nocturia, and was referred for a sleep study.  He was found to have mild obstructive sleep apnea AHI 5.3R DI of 7.3 but there was absence of REM sleep on his evaluation.  He had significant desaturation to a nadir of 80%.  CPAP was initiated at 8 cm.  I saw him in follow-up on November 21, 2014 at which time his compliance was excellent.  I saw him on July 30, 2020 after not having seen him since 2016.  At that time he expressed that he wanted to establish cardiology care with me. He continued to use CPAP therapy and has his initial machine which is now no longer able to be wireless due to the upgrade to [employer] his machine is only 3G.  He was on Effexor for depression and takes alprazolam as needed.  He denied any chest pain, PND or orthopnea.  He was unaware of palpitations, presyncope or syncope.  At times blood pressure is mildly elevated.  He had not had any recent laboratory.  During that evaluation, I recommended he undergo fasting laboratory and with his sleep apnea recommended a baseline echo to assess systolic and diastolic LV function as well as pulmonary pressure.  We discussed weight loss and increased exercise.  Laboratory in August 2022showed normal chemistry profile although ALT was  minimally increased at 46.  CBC was stable, TSH 3.29, hemoglobin A1c 5.29.  Lipid studies were abnormal with total cholesterol 201, triglycerides 342, LDL 113, HDL 28 and VLDL 60.  Since I saw him, he underwent an echo Doppler study in August 26, 2020.  He had hyperdynamic LV function with EF estimated 65 to 70% without wall motion abnormality.  There was mild LVH.  There was mild dilation of his aortic root at 43 mm and moderate dilation of ascending aorta at 45 mm.  He is now on rosuvastatin 20 mg in addition to Vascepa 2 capsules twice a day.  He also was on HCTZ 12.5 mg as needed for swelling.  He is on aspirin therapy.  He also continues to be on Effexor.  He brought his CPAP machine in the office today.  This is no longer wireless since his set up date was August 2016 and we would need to order a new ResMed air sense 11 CPAP unit.  He states over the last 3 weeks his wife had undergone CABG revascularization surgery.  As result was caring for her oftentimes up at night and not consistently using CPAP and when using treatment average use was minimal.  I interrogated his machine which he brought to the office today which revealed only 9 out of 30 days of usage with average use of 2.3 hours.  There was a mask leak.  AHI was 5.2.  He presents for evaluation.   Past Medical History:  Diagnosis Date   Depression    Hepatitis A virus infection    Kidney [place]uman 2017   Kidney stones    Sleep apnea    uses CPAP    Past Surgical History:  Procedure Laterality Date   CHOLECYSTECTOMY N/A 09/02/2016   Procedure: LAPAROSCOPIC CHOLECYSTECTOMY WITH INTRAOPERATIVE CHOLANGIOGRAM;  Surgeon: Jackolyn Confer, MD;  Location: [facility];  Service: General;  Laterality: N/A;   HERNIA REPAIR  1986    Current Medications: Outpatient Medications Prior to Visit  Medication Sig Dispense Refill   aspirin 81 MG chewable tablet Chew 81 mg by mouth daily.     hydrochlorothiazide (HYDRODIURIL) 25 MG tablet Take 12.$RemoveBefore'5mg'HoOHTOHyRMSeP$  (1/2  tablet) to $RemoveBe'25mg'QkeQRhTYR$  (1 tablet) as needed for swelling. 30 tablet 3   icosapent Ethyl (VASCEPA) 1 g capsule Take 2 capsules (2 g total) by mouth 2 (two) times daily. 360 capsule 2   Multiple Vitamins-Minerals (CENTRUM ADULTS) TABS Take 1 tablet by mouth daily.     rosuvastatin (CRESTOR) 20 MG tablet Take 1 tablet (20 mg total) by mouth daily. 90 tablet 3   venlafaxine XR (EFFEXOR XR) 150 MG 24 hr capsule Take 1 capsule (150 mg total) by mouth daily with breakfast. 30 capsule 5   ALPRAZolam (XANAX) 0.5 MG tablet TAKE 1/2 TO 1 TABLET BY MOUTH DAILY AS NEEDED (Patient not taking: Reported on 10/28/2020) 30 tablet 5   Cranberry 500 MG CHEW Chew 1 Units by mouth daily. (Patient not taking: Reported on 10/28/2020)     No facility-administered medications prior to visit.     Allergies:   Sulfa antibiotics and Penicillins   Social History   Socioeconomic History   Marital status: Married    Spouse name: Mickel Baas   Number of children: 0   Years of education: Not on file   Highest education level: Not on file  Occupational History   Occupation: Armed forces technical officer  Tobacco Use   Smoking status: Never   Smokeless tobacco: Never  Vaping Use   Vaping Use: Never used  Substance and Sexual Activity   Alcohol use: No    Alcohol/week: 0.0 standard drinks   Drug use: No   Sexual activity: Not on file  Other Topics Concern   Not on file  Social History Narrative   Not on file   Social Determinants of Health   Financial Resource Strain: Not on file  Food Insecurity: Not on file  Transportation Needs: Not on file  Physical Activity: Not on file  Stress: Not on file  Social Connections: Not on file    Socially, he lives in [place] but was born in [place].  He got an associate degree from [employer] in aviation maintenance.  He works at [employer].  He is married since 24.  No children.  There is no tobacco history.  Family History:  The patient's family history includes Diabetes in  his brother; Heart disease in his father and mother.  His mother died at age 50 with heart failure.  Father died at age 46 and had suffered 2 CVAs.  1 brother died in a boating accident and had diabetes.  1 sister died with lung cancer and was a heavy smoker.  He has 3 living brothers all smoke.  He is the only one of his family who did not smoke.  ROS General: Negative; No fevers, chills, or night sweats;  HEENT: Negative; No changes in vision or hearing, sinus congestion, difficulty swallowing Pulmonary: Negative; No cough, wheezing, shortness of breath, hemoptysis Cardiovascular: Negative; No chest pain, presyncope, syncope, palpitations GI: Negative; No nausea, vomiting, diarrhea, or abdominal pain GU: Negative; No dysuria, hematuria, or difficulty voiding Musculoskeletal: Negative; no myalgias, joint pain, or weakness Hematologic/Oncology: Negative; no easy bruising, bleeding Endocrine: Negative; no heat/cold intolerance; no diabetes Neuro: Negative; no changes in balance, headaches Skin: Negative; No rashes or skin lesions Psychiatric: Negative; No behavioral problems, depression Sleep: Positive for OSA on CPAP therapy set up date 2016; unaware of breakthrough snoring,  no hypersomnolence, bruxism, restless legs, hypnogognic hallucinations, no cataplexy Other comprehensive 14 point system review is negative.   PHYSICAL EXAM:   VS:  BP 120/84 (BP Location: Left Arm, Patient Position: Sitting, Cuff Size: Large)   Pulse 64   Ht _0  (1.803 m)   Wt 220 lb 3.2 oz (99.9 kg)   SpO2 96%   BMI 30.71 kg/m     Repeat blood pressure by me was 112/70  Wt Readings from Last 3 Encounters:  10/28/20 220 lb 3.2 oz (99.9 kg)  07/30/20 225 lb (102.1 kg)  09/02/16 212 lb (96.2 kg)    General: Alert, oriented, no distress.  Skin: normal turgor, no rashes, warm and dry HEENT: Normocephalic, atraumatic. Pupils equal round and reactive to light; sclera anicteric; extraocular muscles intact;   Nose without nasal septal hypertrophy Mouth/Parynx benign; Mallinpatti scale 3 Neck: No JVD, no carotid bruits; normal carotid upstroke Lungs: clear to ausculatation and percussion; no wheezing or rales Chest wall: without tenderness to palpitation Heart: PMI not displaced, RRR, s1 s2 normal, 1/6 systolic murmur, no diastolic murmur, no rubs, gallops, thrills, or heaves Abdomen: soft, nontender; no hepatosplenomehaly, BS+; abdominal aorta nontender and not dilated by palpation. Back: no CVA tenderness Pulses 2+ Musculoskeletal: full range of motion, normal strength, no joint deformities Extremities: no clubbing cyanosis or edema, Homan's sign negative  Neurologic: grossly nonfocal; Cranial nerves grossly wnl Psychologic: Normal mood and affect   Studies/Labs Reviewed:   October 27, 2020 ECG (independently read by me): NSR at 63; no ectopy, normal intervas  July 30, 2020 ECG (independently read by me):  NSR at 64; no ectopy, no ST changes, normal intervals  Recent Labs: BMP Latest Ref Rng & Units 08/23/2020 09/03/2016 09/02/2016  Glucose 65 - 99 mg/dL 94 127(H) 178(H)  BUN 6 - 24 mg/dL _1 Creatinine 0.76 - 1.27 mg/dL 0.90 0.83 0.93  BUN/Creat Ratio 9 - 20 16 - -  Sodium 134 - 144 mmol/L 142 136 136  Potassium 3.5 - 5.2 mmol/L 4.9 4.1 3.7  Chloride 96 - 106 mmol/L 104 105 104  CO2 20 - 29 mmol/L _2 Calcium 8.7 - 10.2 mg/dL 9.4 8.9 8.8(L)     Hepatic Function Latest Ref Rng & Units 08/23/2020 09/03/2016 09/02/2016  Total Protein 6.0 - 8.5 g/dL 6.9 6.5 7.0  Albumin 3.8 - 4.9 g/dL 4.3 3.6 4.3  AST 0 - 40 IU/L 30 39 25  ALT 0 - 44 IU/L 46(H) 44 29  Alk Phosphatase 44 - 121 IU/L 100 51 55  Total Bilirubin 0.0 - 1.2 mg/dL 0.3 0.9 0.5    CBC Latest Ref Rng & Units 08/23/2020 09/02/2016 12/22/2015  WBC 3.4 - 10.8 x10E3/uL 6.3 10.6(H) 6.5  Hemoglobin 13.0 - 17.7 g/dL 14.1 12.4(L) 12.5(L)  Hematocrit 37.5 - 51.0 % 40.8 35.5(L) 36.6(L)  Platelets 150 - 450 x10E3/uL 187 171  174   Lab Results  Component Value Date   MCV 90 08/23/2020   MCV 91.5 09/02/2016   MCV 92.4 12/22/2015   Lab Results  Component Value Date   TSH 3.290 08/23/2020   Lab Results  Component Value Date   HGBA1C 5.9 (H) 08/23/2020     BNP No results found for: BNP  ProBNP No results found for: PROBNP   Lipid Panel     Component Value Date/Time   CHOL 201 (H) 08/23/2020 0839   TRIG 342 (H) 08/23/2020 0839   HDL 28 (L) 08/23/2020 0839   CHOLHDL 7.2 (H) 08/23/2020 0839   LDLCALC 113 (H) 08/23/2020 0839   LABVLDL 60 (H) 08/23/2020 1610     RADIOLOGY: No results found.   Additional studies/ records that were reviewed today include:   Records were reviewed from 2016.   ASSESSMENT:    1. OSA on CPAP   2. Mixed hyperlipidemia   3. Ascending aorta dilatation (HCC)   4. Depression, unspecified depression type   5. Bilateral leg edema       PLAN:  Sean Alexander is a 60 year old gentleman who has a longstanding history of depression and has been on Effexor for over 25 years followed by Dr. Candis Schatz.  In 2016, he had symptoms highly suggestive of obstructive sleep apnea with loud snoring, nonrestorative sleep, daytime sleepiness, as well as witnessed apneic spells and at times awakening gasping for breath.  He underwent a sleep evaluation and during that baseline study he was never able to achieve any REM sleep.  Overall AHI was 5.3 and RDI 7.3/h but is the severity of sleep apnea may have been underestimated since REM sleep did not develop.  He had significant oxygen desaturation to a nadir of 80%.  He has continued to be on CPAP therapy since initiation in 2016.  His ResMed air sense 10 AutoSet unit is no longer wireless due to the [employer] upgrade.  He qualifies for a new machine since his machine is over 50 years old.  However due to supply chain issues there is a significant delay to receive a new ResMed air sense 11 AutoSet unit.  When I saw him several  months ago he did have some leg edema which has improved with as needed HCTZ.  I checked laboratory.  He had significant mixed hyperlipidemia.  He is now on rosuvastatin 20 mg in addition to Vascepa 2 capsules twice daily.  I reviewed his echo Doppler study from August 26, 2020 which showed normal LV function with EF at 65 to 70%.  There were no wall motion abnormalities.  There was mild LVH and mild dilation of his aortic root at 43 mm with moderate dilatation of his ascending aorta at 45 mm.  Pulmonary pressures were normal.  I interrogated his CPAP machine today.  Unfortunately his wife had undergone CABG revascularization surgery.  There were days he slept in the hospital.  There also are nights where he is stayed up to care for her during the night and has not been consistent with CPAP use.  I discussed with him that he should use CPAP for minimum of 7 to 8 hours per night.  We again discussed the importance of increased activity.  I will check with our sleep coordinator to determine if an order had been placed regarding a new machine.  He will bring his CPAP machine to his next appointment if he has not received a new one and I will  see him in 4 to 6 months for reevaluation.   Medication Adjustments/Labs and Tests Ordered: Current medicines are reviewed at length with the patient today.  Concerns regarding medicines are outlined above.  Medication changes, Labs and Tests ordered today are listed in the Patient Instructions below. Patient Instructions  Medication Instructions:  Your Physician recommend you continue on your current medication as directed.    *If you need a refill on your cardiac medications before your next appointment, please call your pharmacy*   Follow-Up: At [facility], you and your health needs are our priority.  As part of our continuing mission to provide you with exceptional heart care, we have created designated Provider Care Teams.  These Care Teams include your  primary Cardiologist (physician) and Advanced Practice Providers (APPs -  Physician Assistants and Nurse Practitioners) who all work together to provide you with the care you need, when you need it.  We recommend signing up for the patient portal called "MyChart".  Sign up information is provided on this After Visit Summary.  MyChart is used to connect with patients for Virtual Visits (Telemedicine).  Patients are able to view lab/test results, encounter notes, upcoming appointments, etc.  Non-urgent messages can be sent to your provider as well.   To learn more about what you can do with MyChart, go to [email or web address].    Your next appointment:   6 month(s)  The format for your next appointment:   In Person  Provider:   Shelva Majestic, MD   Other Instructions BRING CPAP MACHINE TO NEXT APPOINTMENT.   Signed, Shelva Majestic, MD  10/30/2020 [facility] [address] [place], [place], [place]  [address] Phone: [phone]

## 2020-10-28 NOTE — Patient Instructions (Signed)
Medication Instructions:  Your Physician recommend you continue on your current medication as directed.    *If you need a refill on your cardiac medications before your next appointment, please call your pharmacy*   Follow-Up: At [facility], you and your health needs are our priority.  As part of our continuing mission to provide you with exceptional heart care, we have created designated Provider Care Teams.  These Care Teams include your primary Cardiologist (physician) and Advanced Practice Providers (APPs -  Physician Assistants and Nurse Practitioners) who all work together to provide you with the care you need, when you need it.  We recommend signing up for the patient portal called "MyChart".  Sign up information is provided on this After Visit Summary.  MyChart is used to connect with patients for Virtual Visits (Telemedicine).  Patients are able to view lab/test results, encounter notes, upcoming appointments, etc.  Non-urgent messages can be sent to your provider as well.   To learn more about what you can do with MyChart, go to [email or web address].    Your next appointment:   6 month(s)  The format for your next appointment:   In Person  Provider:   Shelva Majestic, MD   Other Instructions BRING CPAP MACHINE TO NEXT APPOINTMENT.

## 2020-10-30 ENCOUNTER — Encounter: Payer: Self-pay | Admitting: Cardiovascular Disease

## 2020-11-15 ENCOUNTER — Ambulatory Visit: Payer: Self-pay

## 2020-11-15 ENCOUNTER — Other Ambulatory Visit: Payer: Self-pay

## 2020-11-15 ENCOUNTER — Ambulatory Visit: Payer: Commercial Managed Care - PPO | Admitting: Orthopaedic Surgery

## 2020-11-15 ENCOUNTER — Encounter: Payer: Self-pay | Admitting: Orthopaedic Surgery

## 2020-11-15 VITALS — BP 128/76 | Ht 70.0 in | Wt 213.0 lb

## 2020-11-15 DIAGNOSIS — M1712 Unilateral primary osteoarthritis, left knee: Secondary | ICD-10-CM | POA: Diagnosis not present

## 2020-11-15 DIAGNOSIS — G8929 Other chronic pain: Secondary | ICD-10-CM | POA: Diagnosis not present

## 2020-11-15 DIAGNOSIS — M25562 Pain in left knee: Secondary | ICD-10-CM

## 2020-11-15 NOTE — Progress Notes (Signed)
Office Visit Note   Patient: Sean Alexander           Date of Birth: 1960-06-12           MRN: [id number] Visit Date: 11/15/2020              Requested by: No referring provider defined for this encounter. PCP: Patient, No Pcp Per (Inactive)   Assessment & Plan: Visit Diagnoses:  1. Chronic pain of left knee   2. Unilateral primary osteoarthritis, left knee     Plan: Patient can use occasional anti-inflammatories if needed or return for injection if he has increased pain.  We discussed locking or giving way symptoms.  He can follow-up if he has increased symptoms.  X-ray images were reviewed and discussed.  Follow-Up Instructions: No follow-ups on file.   Orders:  Orders Placed This Encounter  Procedures   XR KNEE 3 VIEW LEFT   No orders of the defined types were placed in this encounter.     Procedures: No procedures performed   Clinical Data: No additional findings.   Subjective: Chief Complaint  Patient presents with   Left Knee - Pain    HPI 60 year old male with left knee pain for about 4 years gradually getting worse he has had pain posteriorly.  He has been through some physical therapy states that recently gotten worse he has been limping.  Problems with stairs.  He states he has trouble when he lays down trying to go to sleep.  He has pain on the medial aspect.  No definite locking.  No chills or fever no history of gout.  History of anxiety sleep apnea on CPAP.  Review of Systems all other systems noncontributory to HPI.   Objective: Vital Signs: BP 128/76   Ht 5\' 10"  (1.778 m)   Wt 213 lb (96.6 kg)   BMI 30.56 kg/m   Physical Exam Constitutional:      Appearance: He is well-developed.  HENT:     Head: Normocephalic and atraumatic.     Right Ear: External ear normal.     Left Ear: External ear normal.  Eyes:     Pupils: Pupils are equal, round, and reactive to light.  Neck:     Thyroid: No thyromegaly.     Trachea: No tracheal deviation.   Cardiovascular:     Rate and Rhythm: Normal rate.  Pulmonary:     Effort: Pulmonary effort is normal.     Breath sounds: No wheezing.  Abdominal:     General: Bowel sounds are normal.     Palpations: Abdomen is soft.  Musculoskeletal:     Cervical back: Neck supple.  Skin:    General: Skin is warm and dry.     Capillary Refill: Capillary refill takes less than 2 seconds.  Neurological:     Mental Status: He is alert and oriented to person, place, and time.  Psychiatric:        Behavior: Behavior normal.        Thought Content: Thought content normal.        Judgment: Judgment normal.    Ortho Exam patient has some fullness popliteal region suggesting a Baker's cyst.  Posterior medial joint line tenderness some pain with hyperextension. Patient has edema both lower extremities.  ACL PCL exam is normal.  Negative logroll the hips distal pulses are intact.  Specialty Comments:  No specialty comments available.  Imaging: No results found.   PMFS History: Patient Active Problem  List   Diagnosis Date Noted   Unilateral primary osteoarthritis, left knee 11/18/2020   GAD (generalized anxiety disorder) 02/16/2018   Insomnia 02/16/2018   RUQ pain 09/02/2016   OSA on CPAP 11/23/2014   Depression 01/17/2014   Evaluate for Obstructive sleep apnea 01/17/2014   Hypersomnolence 01/17/2014   Anxiety 01/17/2014   Past Medical History:  Diagnosis Date   Depression    Hepatitis A virus infection    Kidney [place] 2017   Kidney stones    Sleep apnea    uses CPAP    Family History  Problem Relation Age of Onset   Heart disease Mother    Heart disease Father    Diabetes Brother    Colon cancer Neg Hx    Colon polyps Neg Hx     Past Surgical History:  Procedure Laterality Date   CHOLECYSTECTOMY N/A 09/02/2016   Procedure: LAPAROSCOPIC CHOLECYSTECTOMY WITH INTRAOPERATIVE CHOLANGIOGRAM;  Surgeon: Jackolyn Confer, MD;  Location: [facility];  Service: General;  Laterality: N/A;    HERNIA REPAIR  1986   Social History   Occupational History   Occupation: Armed forces technical officer  Tobacco Use   Smoking status: Never   Smokeless tobacco: Never  Vaping Use   Vaping Use: Never used  Substance and Sexual Activity   Alcohol use: No    Alcohol/week: 0.0 standard drinks   Drug use: No   Sexual activity: Not on file

## 2020-11-18 DIAGNOSIS — M1712 Unilateral primary osteoarthritis, left knee: Secondary | ICD-10-CM | POA: Insufficient documentation

## 2020-11-18 HISTORY — DX: Unilateral primary osteoarthritis, left knee: M17.12

## 2020-11-29 ENCOUNTER — Encounter: Payer: Self-pay | Admitting: Internal Medicine

## 2021-02-12 ENCOUNTER — Ambulatory Visit: Payer: Commercial Managed Care - PPO | Admitting: Physician Assistant

## 2021-02-23 ENCOUNTER — Other Ambulatory Visit: Payer: Self-pay | Admitting: Physician Assistant

## 2021-08-25 ENCOUNTER — Encounter: Payer: Self-pay | Admitting: Physician Assistant

## 2021-08-25 ENCOUNTER — Ambulatory Visit (INDEPENDENT_AMBULATORY_CARE_PROVIDER_SITE_OTHER): Payer: Commercial Managed Care - PPO | Admitting: Physician Assistant

## 2021-08-25 DIAGNOSIS — F411 Generalized anxiety disorder: Secondary | ICD-10-CM | POA: Diagnosis not present

## 2021-08-25 DIAGNOSIS — F3342 Major depressive disorder, recurrent, in full remission: Secondary | ICD-10-CM

## 2021-08-25 MED ORDER — ALPRAZOLAM 0.5 MG PO TABS: 0.5000 mg | ORAL_TABLET | Freq: Two times a day (BID) | ORAL | 5 refills | Status: DC | PRN

## 2021-08-25 MED ORDER — VENLAFAXINE HCL ER 150 MG PO CP24: 150.0000 mg | ORAL_CAPSULE | Freq: Every day | ORAL | 5 refills | Status: DC

## 2021-08-25 NOTE — Progress Notes (Signed)
[facility]  Patient ID: Sean Alexander,  MRN: [id number]  PCP: Patient, No Pcp Per  Date of Evaluation: 08/25/2021 time spent:20 minutes  Chief Complaint:  Chief Complaint   Anxiety; Depression; Follow-up    HISTORY/CURRENT STATUS: For routine med check.  Doing well. Patient is able to enjoy things.  Energy and motivation are good.  Work is going well. Still at [employer].  No extreme sadness, tearfulness, or feelings of hopelessness.  Sleeps well most of the time.  ADLs and personal hygiene are normal.   Denies any changes in concentration, making decisions, or remembering things.  Appetite has not changed.  Weight is stable.  He does still have anxiety at times, uses the Xanax sometimes once a day and sometimes not at all.  Does not have panic attacks but more so a generalized sense of unease like something bad may happen at any time.  Denies suicidal or homicidal thoughts.  Patient denies increased energy with decreased need for sleep, increased talkativeness, racing thoughts, impulsivity or risky behaviors, increased spending, increased libido, grandiosity, increased irritability or anger, paranoia, and no hallucinations.  Denies dizziness, syncope, seizures, numbness, tingling, tremor, tics, unsteady gait, slurred speech, confusion. Denies muscle or joint pain, stiffness, or dystonia.  Individual Medical History/ Review of Systems: Changes? :No    Past medications for mental health diagnoses include: unknown  Allergies: Sulfa antibiotics and Penicillins  Current Medications:  Current Outpatient Medications:    aspirin 81 MG chewable tablet, Chew 81 mg by mouth daily., Disp: , Rfl:    hydrochlorothiazide (HYDRODIURIL) 25 MG tablet, Take 12.'5mg'$  (1/2 tablet) to '25mg'$  (1 tablet) as needed for swelling., Disp: 30 tablet, Rfl: 3   icosapent Ethyl (VASCEPA) 1 g capsule, Take 2 capsules (2 g total) by mouth 2 (two) times daily., Disp: 360 capsule, Rfl: 2   Multiple  Vitamins-Minerals (CENTRUM ADULTS) TABS, Take 1 tablet by mouth daily., Disp: , Rfl:    ALPRAZolam (XANAX) 0.5 MG tablet, Take 1 tablet (0.5 mg total) by mouth 2 (two) times daily as needed for anxiety., Disp: 30 tablet, Rfl: 5   Cranberry 500 MG CHEW, Chew 1 Units by mouth daily. (Patient not taking: Reported on 10/28/2020), Disp: , Rfl:    rosuvastatin (CRESTOR) 20 MG tablet, Take 1 tablet (20 mg total) by mouth daily., Disp: 90 tablet, Rfl: 3   venlafaxine XR (EFFEXOR XR) 150 MG 24 hr capsule, Take 1 capsule (150 mg total) by mouth daily with breakfast., Disp: 30 capsule, Rfl: 5 Medication Side Effects: none  Family Medical/ Social History: Changes? No  MENTAL HEALTH EXAM:  There were no vitals taken for this visit.There is no height or weight on file to calculate BMI.  General Appearance: Casual, Neat and Well Groomed  Eye Contact:  Good  Speech:  Clear and Coherent and Normal Rate  Volume:  Normal  Mood:  Euthymic  Affect:  Appropriate  Thought Process:  Goal Directed and Descriptions of Associations: Circumstantial  Orientation:  Full (Time, Place, and Person)  Thought Content: Logical   Suicidal Thoughts:  No  Homicidal Thoughts:  No  Memory:  WNL  Judgement:  Good  Insight:  Good  Psychomotor Activity:  Normal  Concentration:  Concentration: Good and Attention Span: Good  Recall:  Good  Fund of Knowledge: Good  Language: Good  Assets:  Desire for Improvement Financial Resources/Insurance Housing Transportation Vocational/Educational  ADL's:  Intact  Cognition: WNL  Prognosis:  Good   DIAGNOSES:    ICD-10-CM  1. Major depression, recurrent, full remission ([place])  F33.42     2. Generalized anxiety disorder  F41.1       Receiving Psychotherapy: No  [name], [facility] in the past.  RECOMMENDATIONS:  PDMP was reviewed.  Xanax filled 02/25/2021. I provided 20 minutes of face to face time during this encounter, including time spent before and after the visit in  records review, medical decision making, counseling pertinent to today's visit, and charting.   I am glad to see him doing so well.  No change in treatment needed.  Continue Xanax 0.5 mg, 1/2-1 daily as needed. Continue Effexor XR 150 mg daily. Return in 6 months.  Donnal Moat, PA-C

## 2022-02-05 ENCOUNTER — Other Ambulatory Visit: Payer: Self-pay

## 2022-02-05 ENCOUNTER — Emergency Department (HOSPITAL_BASED_OUTPATIENT_CLINIC_OR_DEPARTMENT_OTHER)
Admission: EM | Admit: 2022-02-05 | Discharge: 2022-02-06 | Discharge status: Against Medical Advice | Payer: Commercial Managed Care - PPO | Attending: Emergency Medicine | Admitting: Emergency Medicine

## 2022-02-05 ENCOUNTER — Emergency Department (HOSPITAL_BASED_OUTPATIENT_CLINIC_OR_DEPARTMENT_OTHER): Payer: Commercial Managed Care - PPO

## 2022-02-05 DIAGNOSIS — Z7982 Long term (current) use of aspirin: Secondary | ICD-10-CM | POA: Diagnosis not present

## 2022-02-05 DIAGNOSIS — R519 Headache, unspecified: Secondary | ICD-10-CM | POA: Insufficient documentation

## 2022-02-05 DIAGNOSIS — Z79899 Other long term (current) drug therapy: Secondary | ICD-10-CM | POA: Insufficient documentation

## 2022-02-05 DIAGNOSIS — G4733 Obstructive sleep apnea (adult) (pediatric): Secondary | ICD-10-CM | POA: Insufficient documentation

## 2022-02-05 DIAGNOSIS — G529 Cranial nerve disorder, unspecified: Secondary | ICD-10-CM | POA: Diagnosis not present

## 2022-02-05 DIAGNOSIS — R42 Dizziness and giddiness: Secondary | ICD-10-CM

## 2022-02-05 DIAGNOSIS — I10 Essential (primary) hypertension: Secondary | ICD-10-CM | POA: Insufficient documentation

## 2022-02-05 DIAGNOSIS — H81392 Other peripheral vertigo, left ear: Secondary | ICD-10-CM | POA: Insufficient documentation

## 2022-02-05 LAB — RAPID URINE DRUG SCREEN, HOSP PERFORMED
Amphetamines: NOT DETECTED
Barbiturates: NOT DETECTED
Benzodiazepines: POSITIVE — AB
Cocaine: NOT DETECTED
Opiates: NOT DETECTED
Tetrahydrocannabinol: NOT DETECTED

## 2022-02-05 LAB — DIFFERENTIAL
Abs Immature Granulocytes: 0.05 10*3/uL (ref 0.00–0.07)
Basophils Absolute: 0.1 10*3/uL (ref 0.0–0.1)
Basophils Relative: 1 %
Eosinophils Absolute: 0.2 10*3/uL (ref 0.0–0.5)
Eosinophils Relative: 2 %
Immature Granulocytes: 1 %
Lymphocytes Relative: 36 %
Lymphs Abs: 3 10*3/uL (ref 0.7–4.0)
Monocytes Absolute: 0.6 10*3/uL (ref 0.1–1.0)
Monocytes Relative: 7 %
Neutro Abs: 4.4 10*3/uL (ref 1.7–7.7)
Neutrophils Relative %: 53 %

## 2022-02-05 LAB — URINALYSIS, ROUTINE W REFLEX MICROSCOPIC
Bilirubin Urine: NEGATIVE
Glucose, UA: NEGATIVE mg/dL
Hgb urine dipstick: NEGATIVE
Ketones, ur: NEGATIVE mg/dL
Leukocytes,Ua: NEGATIVE
Nitrite: NEGATIVE
Protein, ur: NEGATIVE mg/dL
Specific Gravity, Urine: >=1.03 (ref 1.005–1.030)
pH: 5.5 (ref 5.0–8.0)

## 2022-02-05 LAB — HEPATIC FUNCTION PANEL
ALT: 47 U/L — ABNORMAL HIGH (ref 0–44)
AST: 31 U/L (ref 15–41)
Albumin: 4 g/dL (ref 3.5–5.0)
Alkaline Phosphatase: 72 U/L (ref 38–126)
Bilirubin, Direct: 0.1 mg/dL (ref 0.0–0.2)
Indirect Bilirubin: 0.8 mg/dL (ref 0.3–0.9)
Total Bilirubin: 0.9 mg/dL (ref 0.3–1.2)
Total Protein: 7.5 g/dL (ref 6.5–8.1)

## 2022-02-05 LAB — BASIC METABOLIC PANEL
Anion gap: 6 (ref 5–15)
BUN: 17 mg/dL (ref 6–20)
CO2: 25 mmol/L (ref 22–32)
Calcium: 8.9 mg/dL (ref 8.9–10.3)
Chloride: 104 mmol/L (ref 98–111)
Creatinine, Ser: 0.83 mg/dL (ref 0.61–1.24)
GFR, Estimated: >60 mL/min (ref >60)
Glucose, Bld: 151 mg/dL — ABNORMAL HIGH (ref 70–99)
Potassium: 3.5 mmol/L (ref 3.5–5.1)
Sodium: 135 mmol/L (ref 135–145)

## 2022-02-05 LAB — ETHANOL: Alcohol, Ethyl (B): <10 mg/dL (ref <10)

## 2022-02-05 LAB — CBC
HCT: 38.7 % — ABNORMAL LOW (ref 39.0–52.0)
Hemoglobin: 13.9 g/dL (ref 13.0–17.0)
MCH: 32.3 pg (ref 26.0–34.0)
MCHC: 35.9 g/dL (ref 30.0–36.0)
MCV: 89.8 fL (ref 80.0–100.0)
Platelets: 183 10*3/uL (ref 150–400)
RBC: 4.31 MIL/uL (ref 4.22–5.81)
RDW: 12.2 % (ref 11.5–15.5)
WBC: 8.2 10*3/uL (ref 4.0–10.5)
nRBC: 0 % (ref 0.0–0.2)

## 2022-02-05 LAB — PROTIME-INR
INR: 1 (ref 0.8–1.2)
Prothrombin Time: 12.6 seconds (ref 11.4–15.2)

## 2022-02-05 LAB — APTT: aPTT: 32 seconds (ref 24–36)

## 2022-02-05 NOTE — ED Notes (Signed)
Patient is aware that he needs to provide a urine sample. Will let us know when he uses the bathroom. Urine cup provided.

## 2022-02-05 NOTE — ED Provider Notes (Addendum)
[facility] Provider Note   CSN: [id number] Arrival date & time: 02/05/22  1949     History  Chief Complaint  Patient presents with   Dizziness    Sean Alexander is a 62 y.o. male.  Patient with a complaint of dizziness and feeling off balance since Tuesday.  Occasionally has a little bit of feeling of this in the past that would be very brief.  Never had anything continuing day after day like this.  Associated with a mild headache nothing severe made worse by movement but not a lot of true room spinning.  Denies any numbness any weakness any speech problems or any visual problems.  Past medical history patient not on any medication.  Blood pressure was high when he got here 155/101 but currently it is 275 systolic.  Past medical history mentions sleep apnea and uses CPAP.  Past surgical history sniffing for hernia repair and cholecystectomy.  Patient works for [employer].  He is off currently.  Normally works during the day goes to work very early in the morning at 4 in the morning works to 4 in the afternoon.  After getting off work today he went to urgent care which was concerned about possible CVA and told him to go to emergency department for workup.  Patient denies any upper respiratory symptoms.  Patient does state that he takes melatonin to help sleep and he does take Xanax to help sleep.  No other medications.       Home Medications Prior to Admission medications   Medication Sig Start Date End Date Taking? Authorizing Provider  ALPRAZolam Duanne Moron) 0.5 MG tablet Take 1 tablet (0.5 mg total) by mouth 2 (two) times daily as needed for anxiety. 08/25/21   Addison Lank, PA-C  aspirin 81 MG chewable tablet Chew 81 mg by mouth daily.    [provider]  Cranberry 500 MG CHEW Chew 1 Units by mouth daily. Patient not taking: Reported on 10/28/2020    [provider]  hydrochlorothiazide (HYDRODIURIL) 25 MG tablet Take  12.'5mg'$  (1/2 tablet) to '25mg'$  (1 tablet) as needed for swelling. 07/30/20   Troy Sine, MD  icosapent Ethyl (VASCEPA) 1 g capsule Take 2 capsules (2 g total) by mouth 2 (two) times daily. 10/03/20   Troy Sine, MD  Multiple Vitamins-Minerals (CENTRUM ADULTS) TABS Take 1 tablet by mouth daily.    [provider]  rosuvastatin (CRESTOR) 20 MG tablet Take 1 tablet (20 mg total) by mouth daily. 10/03/20 01/01/21  Troy Sine, MD  venlafaxine XR (EFFEXOR XR) 150 MG 24 hr capsule Take 1 capsule (150 mg total) by mouth daily with breakfast. 08/25/21   Donnal Moat T, PA-C      Allergies    Sulfa antibiotics and Penicillins    Review of Systems   Review of Systems  Constitutional:  Negative for chills and fever.  HENT:  Negative for ear pain and sore throat.   Eyes:  Negative for pain and visual disturbance.  Respiratory:  Negative for cough and shortness of breath.   Cardiovascular:  Negative for chest pain and palpitations.  Gastrointestinal:  Negative for abdominal pain and vomiting.  Genitourinary:  Negative for dysuria and hematuria.  Musculoskeletal:  Negative for arthralgias and back pain.  Skin:  Negative for color change and rash.  Neurological:  Positive for dizziness and light-headedness. Negative for seizures, syncope, speech difficulty, weakness and numbness.  All other systems  reviewed and are negative.   Physical Exam Updated Vital Signs BP 132/86   Pulse 64   Temp 97.8 F (36.6 C) (Oral)   Resp 18   SpO2 94%  Physical Exam Vitals and nursing note reviewed.  Constitutional:      General: He is not in acute distress.    Appearance: Normal appearance. He is well-developed.  HENT:     Head: Normocephalic and atraumatic.  Eyes:     Extraocular Movements: Extraocular movements intact.     Conjunctiva/sclera: Conjunctivae normal.     Pupils: Pupils are equal, round, and reactive to light.  Cardiovascular:     Rate and Rhythm: Normal rate and regular  rhythm.     Heart sounds: No murmur heard. Pulmonary:     Effort: Pulmonary effort is normal. No respiratory distress.     Breath sounds: Normal breath sounds.  Abdominal:     Palpations: Abdomen is soft.     Tenderness: There is no abdominal tenderness.  Musculoskeletal:        General: No swelling.     Cervical back: Normal range of motion and neck supple.  Skin:    General: Skin is warm and dry.     Capillary Refill: Capillary refill takes less than 2 seconds.  Neurological:     Mental Status: He is alert.     Cranial Nerves: Cranial nerve deficit present.     Sensory: No sensory deficit.     Motor: No weakness.     Comments: Pronator drift predominantly in the left arm.  With eyes closed.  Patient also with asymmetrical smile questionable facial weakness on the right side.  But patient states that that has been present long-term.  Psychiatric:        Mood and Affect: Mood normal.     ED Results / Procedures / Treatments   Labs (all labs ordered are listed, but only abnormal results are displayed) Labs Reviewed  BASIC METABOLIC PANEL - Abnormal; Notable for the following components:      Result Value   Glucose, Bld 151 (*)    All other components within normal limits  CBC - Abnormal; Notable for the following components:   HCT 38.7 (*)    All other components within normal limits  URINALYSIS, ROUTINE W REFLEX MICROSCOPIC  ETHANOL  PROTIME-INR  APTT  DIFFERENTIAL  RAPID URINE DRUG SCREEN, HOSP PERFORMED  HEPATIC FUNCTION PANEL  CBG MONITORING, ED    EKG EKG Interpretation  Date/Time:  Thursday February 05 2022 20:04:31 EST Ventricular Rate:  67 PR Interval:  188 QRS Duration: 105 QT Interval:  391 QTC Calculation: 413 R Axis:   30 Text Interpretation: Sinus rhythm Abnormal inferior Q waves No significant change since last tracing Confirmed by Fredia Sorrow [phone]) on 02/05/2022 10:19:49 PM  Radiology No results found.  Procedures Procedures     Medications Ordered in ED Medications - No data to display  ED Course/ Medical Decision Making/ A&P                             Medical Decision Making Amount and/or Complexity of Data Reviewed Labs: ordered. Radiology: ordered.   CRITICAL CARE Performed by: Fredia Sorrow Total critical care time: 35 minutes Critical care time was exclusive of separately billable procedures and treating other patients. Critical care was necessary to treat or prevent imminent or life-threatening deterioration. Critical care was time spent personally by me on the  following activities: development of treatment plan with patient and/or surrogate as well as nursing, discussions with consultants, evaluation of patient's response to treatment, examination of patient, obtaining history from patient or surrogate, ordering and performing treatments and interventions, ordering and review of laboratory studies, ordering and review of radiographic studies, pulse oximetry and re-evaluation of patient's condition.   Patient with symptoms of dizziness and a little bit of vertigo component since Tuesday, so for the past 3 days.  Here with a little pronator drift.  Patient with some persistent feeling off balance little bit of dizziness but was not able to reproduce it by moving his head left or right.  Will do stroke workup.  If head CT negative may need to be transferred into [facility] for MRI.  Would probably discuss with the neurohospitalist prior to doing that.  Based on the symptoms and his presentation not a candidate for any intervention at this time.  Head CT without any acute abnormalities.  Will discuss with neurohospitalist.  Discussed with neurologist Dr. Curly Shores at [facility] agrees that patient needs MRI to rule out CVA.  Patient without any contraindications for MRI.  MRI ordered.  Will transfer patient ED to ED to [facility].  Neurology wants to be contacted once the results of the MRI are back.  Dr. Christy Gentles  excepting for ED to ED transfer to [facility].  Final Clinical Impression(s) / ED Diagnoses Final diagnoses:  Dizziness    Rx / DC Orders ED Discharge Orders     None         Fredia Sorrow, MD 02/05/22 2251    Fredia Sorrow, MD 02/05/22 2318    Fredia Sorrow, MD 02/05/22 2354    Fredia Sorrow, MD 02/06/22 0001    Fredia Sorrow, MD 02/06/22 0002

## 2022-02-05 NOTE — ED Triage Notes (Addendum)
Pt has had dizziness with movement x 2 days accompanied by nausea. No vomiting. No falls.  Feels "off balance"  Was at work today and most of the day felt dizzy

## 2022-02-05 NOTE — ED Notes (Signed)
Pt was able to ambulate to and from the restroom with independent steady gait. He stated he felt slightly dizzy but he did not feel like he was going to pass out. Pt returned to his room and placed on continuous cardiac monitoring, pulse ox and BP cuff. Call bell within reach. No needs at this time.

## 2022-02-06 ENCOUNTER — Emergency Department (HOSPITAL_COMMUNITY): Payer: Commercial Managed Care - PPO

## 2022-02-06 ENCOUNTER — Other Ambulatory Visit: Payer: Self-pay

## 2022-02-06 ENCOUNTER — Emergency Department (HOSPITAL_COMMUNITY)
Admission: EM | Admit: 2022-02-06 | Discharge: 2022-02-06 | Disposition: A | Discharge status: Home / Self Care | Payer: Commercial Managed Care - PPO | Source: Home / Self Care | Attending: Emergency Medicine | Admitting: Emergency Medicine

## 2022-02-06 DIAGNOSIS — H8112 Benign paroxysmal vertigo, left ear: Secondary | ICD-10-CM

## 2022-02-06 DIAGNOSIS — Z7982 Long term (current) use of aspirin: Secondary | ICD-10-CM | POA: Insufficient documentation

## 2022-02-06 DIAGNOSIS — H81392 Other peripheral vertigo, left ear: Secondary | ICD-10-CM | POA: Insufficient documentation

## 2022-02-06 DIAGNOSIS — I10 Essential (primary) hypertension: Secondary | ICD-10-CM | POA: Insufficient documentation

## 2022-02-06 DIAGNOSIS — Z79899 Other long term (current) drug therapy: Secondary | ICD-10-CM | POA: Insufficient documentation

## 2022-02-06 DIAGNOSIS — R42 Dizziness and giddiness: Secondary | ICD-10-CM

## 2022-02-06 LAB — CBC WITH DIFFERENTIAL/PLATELET
Abs Immature Granulocytes: 0.04 10*3/uL (ref 0.00–0.07)
Basophils Absolute: 0.1 10*3/uL (ref 0.0–0.1)
Basophils Relative: 1 %
Eosinophils Absolute: 0 10*3/uL (ref 0.0–0.5)
Eosinophils Relative: 1 %
HCT: 42.3 % (ref 39.0–52.0)
Hemoglobin: 15.4 g/dL (ref 13.0–17.0)
Immature Granulocytes: 1 %
Lymphocytes Relative: 20 %
Lymphs Abs: 1.7 10*3/uL (ref 0.7–4.0)
MCH: 32.6 pg (ref 26.0–34.0)
MCHC: 36.4 g/dL — ABNORMAL HIGH (ref 30.0–36.0)
MCV: 89.4 fL (ref 80.0–100.0)
Monocytes Absolute: 0.7 10*3/uL (ref 0.1–1.0)
Monocytes Relative: 8 %
Neutro Abs: 6.2 10*3/uL (ref 1.7–7.7)
Neutrophils Relative %: 69 %
Platelets: 199 10*3/uL (ref 150–400)
RBC: 4.73 MIL/uL (ref 4.22–5.81)
RDW: 12.1 % (ref 11.5–15.5)
WBC: 8.8 10*3/uL (ref 4.0–10.5)
nRBC: 0 % (ref 0.0–0.2)

## 2022-02-06 LAB — BASIC METABOLIC PANEL
Anion gap: 8 (ref 5–15)
BUN: 11 mg/dL (ref 6–20)
CO2: 24 mmol/L (ref 22–32)
Calcium: 9.4 mg/dL (ref 8.9–10.3)
Chloride: 106 mmol/L (ref 98–111)
Creatinine, Ser: 0.76 mg/dL (ref 0.61–1.24)
GFR, Estimated: >60 mL/min (ref >60)
Glucose, Bld: 139 mg/dL — ABNORMAL HIGH (ref 70–99)
Potassium: 3.8 mmol/L (ref 3.5–5.1)
Sodium: 138 mmol/L (ref 135–145)

## 2022-02-06 MED ORDER — MECLIZINE HCL 25 MG PO TABS: 25.0000 mg | ORAL_TABLET | Freq: Three times a day (TID) | ORAL | 0 refills | Status: DC | PRN

## 2022-02-06 NOTE — ED Provider Notes (Signed)
[facility] Provider Note   CSN: [id number] Arrival date & time: 02/06/22  1355     History {Add pertinent medical, surgical, social history, OB history to HPI:1} Chief Complaint  Patient presents with   Dizziness    Sean Alexander is a 62 y.o. male.   Dizziness      Home Medications Prior to Admission medications   Medication Sig Start Date End Date Taking? Authorizing Provider  ALPRAZolam Duanne Moron) 0.5 MG tablet Take 1 tablet (0.5 mg total) by mouth 2 (two) times daily as needed for anxiety. 08/25/21   Addison Lank, PA-C  aspirin 81 MG chewable tablet Chew 81 mg by mouth daily.    [provider]  Cranberry 500 MG CHEW Chew 1 Units by mouth daily. Patient not taking: Reported on 10/28/2020    [provider]  hydrochlorothiazide (HYDRODIURIL) 25 MG tablet Take 12.'5mg'$  (1/2 tablet) to '25mg'$  (1 tablet) as needed for swelling. 07/30/20   Troy Sine, MD  icosapent Ethyl (VASCEPA) 1 g capsule Take 2 capsules (2 g total) by mouth 2 (two) times daily. 10/03/20   Troy Sine, MD  Multiple Vitamins-Minerals (CENTRUM ADULTS) TABS Take 1 tablet by mouth daily.    [provider]  rosuvastatin (CRESTOR) 20 MG tablet Take 1 tablet (20 mg total) by mouth daily. 10/03/20 01/01/21  Troy Sine, MD  venlafaxine XR (EFFEXOR XR) 150 MG 24 hr capsule Take 1 capsule (150 mg total) by mouth daily with breakfast. 08/25/21   Donnal Moat T, PA-C      Allergies    Sulfa antibiotics and Penicillins    Review of Systems   Review of Systems  Neurological:  Positive for dizziness.    Physical Exam Updated Vital Signs BP (!) 155/107 (BP Location: Right Arm)   Pulse 79   Temp 98 F (36.7 C) (Oral)   Resp 16   SpO2 96%  Physical Exam  ED Results / Procedures / Treatments   Labs (all labs ordered are listed, but only abnormal results are displayed) Labs Reviewed  CBC WITH DIFFERENTIAL/PLATELET - Abnormal; Notable  for the following components:      Result Value   MCHC 36.4 (*)    All other components within normal limits  BASIC METABOLIC PANEL - Abnormal; Notable for the following components:   Glucose, Bld 139 (*)    All other components within normal limits    EKG None  Radiology CT HEAD WO CONTRAST  Result Date: 02/05/2022 CLINICAL DATA:  Stroke-like symptoms EXAM: CT HEAD WITHOUT CONTRAST TECHNIQUE: Contiguous axial images were obtained from the base of the skull through the vertex without intravenous contrast. RADIATION DOSE REDUCTION: This exam was performed according to the departmental dose-optimization program which includes automated exposure control, adjustment of the mA and/or kV according to patient size and/or use of iterative reconstruction technique. COMPARISON:  None Available. FINDINGS: Brain: No evidence of acute infarction, hemorrhage, hydrocephalus, extra-axial collection or mass lesion/mass effect. Vascular: No hyperdense vessel or unexpected calcification. Skull: Normal. Negative for fracture or focal lesion. Sinuses/Orbits: No acute finding. Other: None. IMPRESSION: No acute abnormality noted. Electronically Signed   By: Inez Catalina M.D.   On: 02/05/2022 23:07    Procedures Procedures  {Document cardiac monitor, telemetry assessment procedure when appropriate:1}  Medications Ordered in ED Medications - No data to display  ED Course/ Medical Decision Making/ A&P   {   Click here for ABCD2, HEART and other  calculatorsREFRESH Note before signing :1}                          Medical Decision Making  ***  {Document critical care time when appropriate:1} {Document review of labs and clinical decision tools ie heart score, Chads2Vasc2 etc:1}  {Document your independent review of radiology images, and any outside records:1} {Document your discussion with family members, caretakers, and with consultants:1} {Document social determinants of health affecting pt's  care:1} {Document your decision making why or why not admission, treatments were needed:1} Final Clinical Impression(s) / ED Diagnoses Final diagnoses:  None    Rx / DC Orders ED Discharge Orders     None

## 2022-02-06 NOTE — ED Notes (Signed)
Pt verbalizes understanding of discharge instructions. Opportunity for questions and answers were provided. Pt discharged from the ED.   ?

## 2022-02-06 NOTE — ED Notes (Signed)
Called Karen Chafe for transport (ED to [facility] ED) '@0004'$ 

## 2022-02-06 NOTE — ED Notes (Signed)
Pt reports he is feeling better and stated he does not want to be transferred to [facility] tonight for the MRI. This RN explained to him that the neurologist recommended him get the MRI to rule out stroke and I explained to him the risks of leaving AMA and the patient verbalized understanding. He states he has been up for almost 24 hours and is feeling better. He stated that he would like to go home and that if he wakes up in the morning and doesn't feel well then he will go check in at [facility]. Dr. Karle Starch informed and then went into the room and spoke directly with the patient.

## 2022-02-06 NOTE — ED Triage Notes (Signed)
Patient here for evaluation of dizziness that started on Tuesday this week (LKW four days ago), seen yesterday at [facility] for same and declined transport to [facility] for MRI, patient here now requesting MRI. Patient is alert, oriented, and in no apparent distress a this time.

## 2022-02-06 NOTE — ED Provider Triage Note (Signed)
Emergency Medicine Provider Triage Evaluation Note  Sean LICHTY , a 62 y.o. male  was evaluated in triage.  Pt complains of this, was seen at [facility] yesterday for intermittent dizziness, noted to have some pronator drift on exam had reassuring CT was told to come in for MRI to rule out possible stroke and he did not want to go immediately but states he has become more concerned and decided to come in today for the MRI.  He denies any change in his symptoms, states he is feeling back to his baseline.  Review of Systems  Positive:  Negative:   Physical Exam  BP (!) 155/107 (BP Location: Right Arm)   Pulse 79   Temp 98 F (36.7 C) (Oral)   Resp 16   SpO2 96%  Gen:   Awake, no distress   Resp:  Normal effort  MSK:   Moves extremities without difficulty  Other:    Medical Decision Making  Medically screening exam initiated at 2:36 PM.  Appropriate orders placed.  ROSEMARY PENTECOST was informed that the remainder of the evaluation will be completed by another provider, this initial triage assessment does not replace that evaluation, and the importance of remaining in the ED until their evaluation is complete.     Gwenevere Abbot, [place] 02/06/22 1437

## 2022-02-06 NOTE — Discharge Instructions (Addendum)
You were seen in the urgency department today for your dizziness while in the emergency department we completed a full history and physical exam based off her physical exam we feel that you are suffering from a condition known as BPPV which is a peripheral form of vertigo.  MRI showed no evidence of an acute stroke.  Given this fact we are discharging you home on meclizine.  Please see the above handout for how to perform the Epley maneuver which may help alleviate your symptoms you may also look at this maneuver up on YouTube if you perform 3 times daily there is a chance that your symptoms may resolve.    If you develop worsening symptoms with the inability to walk, slurred speech, weakness on one side of your body or cuts in your vision please return the emergency department as these are concerning symptoms for stroke. Sincerely, Clydie Braun, PGY-2

## 2022-02-06 NOTE — ED Provider Notes (Signed)
Patient seen on prior shift for vague neuro symptoms and recommended to go to [facility] for an MRI. Patient now reports he feels back to baseline and does not want to go. He understands the risks of possible stroke, that symptoms can be progressive and potentially life threatening. Recommend he follow up with PCP or go to [facility] if symptoms return.    Truddie Hidden, MD 02/06/22 [phone]

## 2022-02-12 NOTE — Progress Notes (Signed)
Cardiology Clinic Note   Patient Name: CASHEL OSKEY Date of Encounter: 02/13/2022  Primary Care Provider:  Patient, No Pcp Per Primary Cardiologist:  Shelva Majestic, MD  Patient Profile    BRYHEEM LIGOCKI 62 year old male presents the clinic today for follow-up evaluation of his hyperlipidemia and ascending aortic dilation.  Past Medical History    Past Medical History:  Diagnosis Date   Depression    Hepatitis A virus infection    Kidney Peery 2017   Kidney stones    Sleep apnea    uses CPAP   Past Surgical History:  Procedure Laterality Date   CHOLECYSTECTOMY N/A 09/02/2016   Procedure: LAPAROSCOPIC CHOLECYSTECTOMY WITH INTRAOPERATIVE CHOLANGIOGRAM;  Surgeon: Jackolyn Confer, MD;  Location: [facility];  Service: General;  Laterality: N/A;   HERNIA REPAIR  1986    Allergies  Allergies  Allergen Reactions   Sulfa Antibiotics Anaphylaxis    Rash on neck and sides   Penicillins Rash    Has patient had a PCN reaction causing immediate rash, facial/tongue/throat swelling, SOB or lightheadedness with hypotension: No Has patient had a PCN reaction causing severe rash involving mucus membranes or skin necrosis: No Has patient had a PCN reaction that required hospitalization: No Has patient had a PCN reaction occurring within the last 10 years: No If all of the above answers are "NO", then may proceed with Cephalosporin use.      History of Present Illness    TAREN WOODLEE is a PMH of OSA on CPAP, hyperlipidemia, ascending aortic dilation, depression, and bilateral lower extremity edema.  His PMH also includes anxiety.  He reestablished care with cardiology 7/22.  He was previously seen by Dr. Claiborne Billings in 2016.  During that time he was working at [employer] and having issues with his sleep.  He was working in the afternoon until early morning and reported issues with snoring, nonrestorative sleep, nocturia and was referred for sleep study at that time.  He was found to have mild OSA.   He was started on CPAP.  He was seen in follow-up by Dr. Claiborne Billings on 10/28/2020.  During that time he reported that his wife had undergone CABG revascularization.  He was her primary caregiver.  He was not consistently using his CPAP.  His echocardiogram 08/26/2020 showed an EF of 65-70%, mild dilation of the aortic root measuring 43 mm with moderate dilation of the ascending aorta measuring 45 mm.  He was using HCTZ 12.5 mg as needed for lower extremity swelling.  His blood pressure was noted to be 120/84 and his pulse was 64.  He was advised to use his CPAP machine 7 to 8 hours per night.  He presents to the clinic today for follow-up evaluation and states 2 to 3 weeks ago he noted consistent dizziness.  His imaging was reassuring.  He is now doing Epley maneuvers and taking meclizine.  His dizziness is improving.   He is no longer taking his medications.  He reports that he felt well and stopped use.  We reviewed his past medical history and he expressed understanding.  I will repeat his fasting lipids in 3 months after he works on diet and exercise.  I will repeat his echocardiogram for aortic dilation.  His blood pressure is initially elevated today.  I will give him a blood pressure log to send back to [place] in 2 weeks.  We will plan follow-up in 6 months.  I will give him a list of  PCP.  Today he denies chest pain, shortness of breath, lower extremity edema, fatigue, palpitations, melena, hematuria, hemoptysis, diaphoresis, weakness, presyncope, syncope, orthopnea, and PND.    Home Medications    Prior to Admission medications   Medication Sig Start Date End Date Taking? Authorizing Provider  ALPRAZolam Duanne Moron) 0.5 MG tablet Take 1 tablet (0.5 mg total) by mouth 2 (two) times daily as needed for anxiety. 08/25/21   Addison Lank, PA-C  aspirin 81 MG chewable tablet Chew 81 mg by mouth daily.    [provider]  Cranberry 500 MG CHEW Chew 1 Units by mouth daily. Patient not taking: Reported  on 10/28/2020    [provider]  hydrochlorothiazide (HYDRODIURIL) 25 MG tablet Take 12.78m (1/2 tablet) to 228m(1 tablet) as needed for swelling. 07/30/20   KeTroy SineMD  icosapent Ethyl (VASCEPA) 1 g capsule Take 2 capsules (2 g total) by mouth 2 (two) times daily. 10/03/20   KeTroy SineMD  meclizine (ANTIVERT) 25 MG tablet Take 1 tablet (25 mg total) by mouth 3 (three) times daily as needed for dizziness. 02/06/22   StDonzetta MattersMD  Multiple Vitamins-Minerals (CENTRUM ADULTS) TABS Take 1 tablet by mouth daily.    [provider]  rosuvastatin (CRESTOR) 20 MG tablet Take 1 tablet (20 mg total) by mouth daily. 10/03/20 01/01/21  KeTroy SineMD  venlafaxine XR (EFFEXOR XR) 150 MG 24 hr capsule Take 1 capsule (150 mg total) by mouth daily with breakfast. 08/25/21   HuAddison LankPA-C    Family History    Family History  Problem Relation Age of Onset   Heart disease Mother    Heart disease Father    Diabetes Brother    Colon cancer Neg Hx    Colon polyps Neg Hx    He indicated that his mother is alive. He indicated that his father is deceased. He indicated that his sister is alive. He indicated that his brother is deceased. He indicated that his maternal grandmother is deceased. He indicated that his maternal grandfather is deceased. He indicated that his paternal grandmother is deceased. He indicated that the status of his neg hx is unknown.  Social History    Social History   Socioeconomic History   Marital status: Married    Spouse name: LaMickel Baas Number of children: 0   Years of education: Not on file   Highest education level: Not on file  Occupational History   Occupation: quArmed forces technical officerTobacco Use   Smoking status: Never   Smokeless tobacco: Never  Vaping Use   Vaping Use: Never used  Substance and Sexual Activity   Alcohol use: No    Alcohol/week: 0.0 standard drinks of alcohol   Drug use: No   Sexual activity:  Not on file  Other Topics Concern   Not on file  Social History Narrative   Not on file   Social Determinants of Health   Financial Resource Strain: Not on file  Food Insecurity: Not on file  Transportation Needs: Not on file  Physical Activity: Not on file  Stress: Not on file  Social Connections: Not on file  Intimate Partner Violence: Not on file     Review of Systems    General:  No chills, fever, night sweats or weight changes.  Cardiovascular:  No chest pain, dyspnea on exertion, edema, orthopnea, palpitations, paroxysmal nocturnal dyspnea. Dermatological: No rash, lesions/masses Respiratory: No cough, dyspnea Urologic: No  hematuria, dysuria Abdominal:   No nausea, vomiting, diarrhea, bright red blood per rectum, melena, or hematemesis Neurologic:  No visual changes, wkns, changes in mental status. All other systems reviewed and are otherwise negative except as noted above.  Physical Exam    VS:  BP 130/82   Pulse 70   Ht 5' 10"$  (1.778 m)   Wt 228 lb 12.8 oz (103.8 kg)   SpO2 94%   BMI 32.83 kg/m  , BMI Body mass index is 32.83 kg/m. GEN: Well nourished, well developed, in no acute distress. HEENT: normal. Neck: Supple, no JVD, carotid bruits, or masses. Cardiac: RRR, no murmurs, rubs, or gallops. No clubbing, cyanosis, edema.  Radials/DP/PT 2+ and equal bilaterally.  Respiratory:  Respirations regular and unlabored, clear to auscultation bilaterally. GI: Soft, nontender, nondistended, BS + x 4. MS: no deformity or atrophy. Skin: warm and dry, no rash. Neuro:  Strength and sensation are intact. Psych: Normal affect.  Accessory Clinical Findings    Recent Labs: 02/05/2022: ALT 47 02/06/2022: BUN 11; Creatinine, Ser 0.76; Hemoglobin 15.4; Platelets 199; Potassium 3.8; Sodium 138   Recent Lipid Panel    Component Value Date/Time   CHOL 201 (H) 08/23/2020 0839   TRIG 342 (H) 08/23/2020 0839   HDL 28 (L) 08/23/2020 0839   CHOLHDL 7.2 (H) 08/23/2020 0839    LDLCALC 113 (H) 08/23/2020 0839         ECG personally reviewed by me today-none today.  Echocardiogram 08/26/2020  IMPRESSIONS     1. Left ventricular ejection fraction, by estimation, is 65 to 70%. The  left ventricle has normal function. The left ventricle has no regional  wall motion abnormalities. There is mild left ventricular hypertrophy.  Left ventricular diastolic parameters  are indeterminate.   2. Right ventricular systolic function is normal. The right ventricular  size is normal.   3. The mitral valve is normal in structure. Trivial mitral valve  regurgitation.   4. The aortic valve is normal in structure. Aortic valve regurgitation is  trivial.   5. Aortic dilatation noted. There is mild dilatation of the aortic root,  measuring 43 mm. There is moderate dilatation of the ascending aorta,  measuring 45 mm.   FINDINGS   Left Ventricle: Left ventricular ejection fraction, by estimation, is 65  to 70%. The left ventricle has normal function. The left ventricle has no  regional wall motion abnormalities. The left ventricular internal cavity  size was normal in size. There is   mild left ventricular hypertrophy. Left ventricular diastolic parameters  are indeterminate.   Right Ventricle: The right ventricular size is normal. Right vetricular  wall thickness was not assessed. Right ventricular systolic function is  normal.   Left Atrium: Left atrial size was normal in size.   Right Atrium: Right atrial size was normal in size.   Pericardium: There is no evidence of pericardial effusion.   Mitral Valve: The mitral valve is normal in structure. Trivial mitral  valve regurgitation.   Tricuspid Valve: The tricuspid valve is normal in structure. Tricuspid  valve regurgitation is trivial.   Aortic Valve: The aortic valve is normal in structure. Aortic valve  regurgitation is trivial.   Pulmonic Valve: The pulmonic valve was normal in structure. Pulmonic valve   regurgitation is not visualized.   Aorta: Aortic dilatation noted. There is mild dilatation of the aortic  root, measuring 43 mm. There is moderate dilatation of the ascending  aorta, measuring 45 mm.   IAS/Shunts: No atrial  level shunt detected by color flow Doppler.   Assessment & Plan   1.  Hyperlipidemia-LDL 113 on 08/23/2020.  Patient stopped taking medications.  Will trial diet and physical activity before restarting medication. Repeat fasting lipids in 3 months Heart healthy low-sodium high-fiber diet Increase physical activity as tolerated  OSA-reports compliance with CPAP.  Waking up well rested. Continue CPAP use  Ascending aortic dilation-echocardiogram noted mild dilation of the aortic root measuring 43 mm and moderate dilation of the ascending aorta measuring 45 mm.  Blood pressure well-controlled. Repeat echocardiogram  Elevated blood pressure-initially blood pressure today is 134/94.  On recheck is 130/82.  I will have him maintain a 2-week blood pressure log and report.  Lower extremity edema-weight stable.  Denies increased DOE or activity intolerance.  Euvolemic. Elevate lower extremities when not active Lower extremity support stockings Low-salt diet  Dizziness-noted intermittent episodes 2 to 3 months ago.  2 to 3 weeks ago he noted consistent dizziness and presented to the emergency department for imaging.  Imaging was reassuring.  He was prescribed meclizine and taught Epley maneuvers.  His dizziness is improving. Will give  PCP list  Disposition: Follow-up with Dr. Claiborne Billings in 6.   Jossie Ng. Carole Deere NP-C     02/13/2022, 11:31 AM [facility] [address] Office [phone] Fax [phone]    I spent14 minutes examining this patient, reviewing medications, and using patient centered shared decision making involving her cardiac care.  Prior to her visit I spent greater than 20 minutes reviewing her past medical  history,  medications, and prior cardiac tests.

## 2022-02-13 ENCOUNTER — Encounter: Payer: Self-pay | Admitting: General Practice

## 2022-02-13 ENCOUNTER — Ambulatory Visit: Payer: Commercial Managed Care - PPO | Attending: General Practice | Admitting: General Practice

## 2022-02-13 VITALS — BP 130/82 | HR 70 | Ht 70.0 in | Wt 228.8 lb

## 2022-02-13 DIAGNOSIS — G4733 Obstructive sleep apnea (adult) (pediatric): Secondary | ICD-10-CM | POA: Diagnosis not present

## 2022-02-13 DIAGNOSIS — E782 Mixed hyperlipidemia: Secondary | ICD-10-CM | POA: Diagnosis not present

## 2022-02-13 DIAGNOSIS — R600 Localized edema: Secondary | ICD-10-CM

## 2022-02-13 DIAGNOSIS — I77810 Thoracic aortic ectasia: Secondary | ICD-10-CM | POA: Diagnosis not present

## 2022-02-13 NOTE — Patient Instructions (Signed)
Medication Instructions:  Your physician recommends that you continue on your current medications as directed. Please refer to the Current Medication list given to you today.  *If you need a refill on your cardiac medications before your next appointment, please call your pharmacy*  Please take your blood pressure daily for 2 weeks and send in a MyChart message. Please include heart rates.   HOW TO TAKE YOUR BLOOD PRESSURE: Rest 5 minutes before taking your blood pressure. Don't smoke or drink caffeinated beverages for at least 30 minutes before. Take your blood pressure before (not after) you eat. Sit comfortably with your back supported and both feet on the floor (don't cross your legs). Elevate your arm to heart level on a table or a desk. Use the proper sized cuff. It should fit smoothly and snugly around your bare upper arm. There should be enough room to slip a fingertip under the cuff. The bottom edge of the cuff should be 1 inch above the crease of the elbow. Ideally, take 3 measurements at one sitting and record the average.    Lab Work: Your physician recommends that you return in 3 months to have the following lab drawn: Lipids  If you have labs (blood work) drawn today and your tests are completely normal, you will receive your results only by: MyChart Message (if you have MyChart) OR A paper copy in the mail If you have any lab test that is abnormal or we need to change your treatment, we will call you to review the results.   Testing/Procedures: Your physician has requested that you have an echocardiogram. Echocardiography is a painless test that uses sound waves to create images of your heart. It provides your doctor with information about the size and shape of your heart and how well your heart's chambers and valves are working. This procedure takes approximately one hour. There are no restrictions for this procedure. Please do NOT wear cologne, perfume, aftershave, or  lotions (deodorant is allowed). Please arrive 15 minutes prior to your appointment time.    Follow-Up: At [facility], you and your health needs are our priority.  As part of our continuing mission to provide you with exceptional heart care, we have created designated Provider Care Teams.  These Care Teams include your primary Cardiologist (physician) and Advanced Practice Providers (APPs -  Physician Assistants and Nurse Practitioners) who all work together to provide you with the care you need, when you need it.  We recommend signing up for the patient portal called "MyChart".  Sign up information is provided on this After Visit Summary.  MyChart is used to connect with patients for Virtual Visits (Telemedicine).  Patients are able to view lab/test results, encounter notes, upcoming appointments, etc.  Non-urgent messages can be sent to your provider as well.   To learn more about what you can do with MyChart, go to [email or web address].    Your next appointment:   6 month(s)  Provider:   Shelva Majestic, MD     Other Instructions High-Fiber Eating Plan Fiber, also called dietary fiber, is a type of carbohydrate. It is found foods such as fruits, vegetables, whole grains, and beans. A high-fiber diet can have many health benefits. Your health care provider may recommend a high-fiber diet to help: Prevent constipation. Fiber can make your bowel movements more regular. Lower your cholesterol. Relieve the following conditions: Inflammation of veins in the anus (hemorrhoids). Inflammation of specific areas of the digestive tract (uncomplicated diverticulosis). A problem  of the large intestine, also called the colon, that sometimes causes pain and diarrhea (irritable bowel syndrome, or IBS). Prevent overeating as part of a weight-loss plan. Prevent heart disease, type 2 diabetes, and certain cancers. What are tips for following this plan? Reading food labels  Check the nutrition  facts label on food products for the amount of dietary fiber. Choose foods that have 5 grams of fiber or more per serving. The goals for recommended daily fiber intake include: Men (age 66 or younger): 34-38 g. Men (over age 33): 28-34 g. Women (age 62 or younger): 25-28 g. Women (over age 51): 22-25 g. Your daily fiber goal is _____________ g. Shopping Choose whole fruits and vegetables instead of processed forms, such as apple juice or applesauce. Choose a wide variety of high-fiber foods such as avocados, lentils, oats, and kidney beans. Read the nutrition facts label of the foods you choose. Be aware of foods with added fiber. These foods often have high sugar and sodium amounts per serving. Cooking Use whole-grain flour for baking and cooking. Cook with brown rice instead of white rice. Meal planning Start the day with a breakfast that is high in fiber, such as a cereal that contains 5 g of fiber or more per serving. Eat breads and cereals that are made with whole-grain flour instead of refined flour or white flour. Eat brown rice, bulgur wheat, or millet instead of white rice. Use beans in place of meat in soups, salads, and pasta dishes. Be sure that half of the grains you eat each day are whole grains. General information You can get the recommended daily intake of dietary fiber by: Eating a variety of fruits, vegetables, grains, nuts, and beans. Taking a fiber supplement if you are not able to take in enough fiber in your diet. It is better to get fiber through food than from a supplement. Gradually increase how much fiber you consume. If you increase your intake of dietary fiber too quickly, you may have bloating, cramping, or gas. Drink plenty of water to help you digest fiber. Choose high-fiber snacks, such as berries, raw vegetables, nuts, and popcorn. What foods should I eat? Fruits Berries. Pears. Apples. Oranges. Avocado. Prunes and raisins. Dried figs. Vegetables Sweet  potatoes. Spinach. Kale. Artichokes. Cabbage. Broccoli. Cauliflower. Green peas. Carrots. Squash. Grains Whole-grain breads. Multigrain cereal. Oats and oatmeal. Brown rice. Barley. Bulgur wheat. [place]. Quinoa. Bran muffins. Popcorn. Rye wafer crackers. Meats and other proteins Navy beans, kidney beans, and pinto beans. Soybeans. Split peas. Lentils. Nuts and seeds. Dairy Fiber-fortified yogurt. Beverages Fiber-fortified soy milk. Fiber-fortified orange juice. Other foods Fiber bars. The items listed above may not be a complete list of recommended foods and beverages. Contact a dietitian for more information. What foods should I avoid? Fruits Fruit juice. Cooked, strained fruit. Vegetables Fried potatoes. Canned vegetables. Well-cooked vegetables. Grains White bread. Pasta made with refined flour. White rice. Meats and other proteins Fatty cuts of meat. Fried chicken or fried fish. Dairy Milk. Yogurt. Cream cheese. Sour cream. Fats and oils Butters. Beverages Soft drinks. Other foods Cakes and pastries. The items listed above may not be a complete list of foods and beverages to avoid. Talk with your dietitian about what choices are best for you. Summary Fiber is a type of carbohydrate. It is found in foods such as fruits, vegetables, whole grains, and beans. A high-fiber diet has many benefits. It can help to prevent constipation, lower blood cholesterol, aid weight loss, and reduce your risk  of heart disease, diabetes, and certain cancers. Increase your intake of fiber gradually. Increasing fiber too quickly may cause cramping, bloating, and gas. Drink plenty of water while you increase the amount of fiber you consume. The best sources of fiber include whole fruits and vegetables, whole grains, nuts, seeds, and beans. This information is not intended to replace advice given to you by your health care provider. Make sure you discuss any questions you have with your health care  provider. Document Revised: 04/27/2019 Document Reviewed: 04/27/2019 Elsevier Patient Education  [employer].

## 2022-02-16 ENCOUNTER — Other Ambulatory Visit: Payer: Self-pay | Admitting: Physician Assistant

## 2022-03-03 ENCOUNTER — Ambulatory Visit: Payer: Commercial Managed Care - PPO | Admitting: Physician Assistant

## 2022-03-27 ENCOUNTER — Ambulatory Visit (HOSPITAL_COMMUNITY): Payer: Commercial Managed Care - PPO | Attending: General Practice

## 2022-03-27 ENCOUNTER — Other Ambulatory Visit: Payer: Self-pay

## 2022-03-27 DIAGNOSIS — E782 Mixed hyperlipidemia: Secondary | ICD-10-CM

## 2022-03-27 DIAGNOSIS — I712 Thoracic aortic aneurysm, without rupture, unspecified: Secondary | ICD-10-CM

## 2022-03-27 DIAGNOSIS — I77810 Thoracic aortic ectasia: Secondary | ICD-10-CM | POA: Diagnosis present

## 2022-03-27 LAB — ECHOCARDIOGRAM COMPLETE
Area-P 1/2: 2.83 cm2
P 1/2 time: 647 msec
S' Lateral: 3.3 cm

## 2022-03-28 LAB — LIPID PANEL
Chol/HDL Ratio: 4.9 ratio (ref 0.0–5.0)
Cholesterol, Total: 167 mg/dL (ref 100–199)
HDL: 34 mg/dL — ABNORMAL LOW (ref >39)
LDL Chol Calc (NIH): 106 mg/dL — ABNORMAL HIGH (ref 0–99)
Triglycerides: 150 mg/dL — ABNORMAL HIGH (ref 0–149)
VLDL Cholesterol Cal: 27 mg/dL (ref 5–40)

## 2022-03-30 DIAGNOSIS — E7849 Other hyperlipidemia: Secondary | ICD-10-CM

## 2022-03-30 MED ORDER — ROSUVASTATIN CALCIUM 10 MG PO TABS: 10.0000 mg | ORAL_TABLET | Freq: Every day | ORAL | 0 refills | Status: DC

## 2022-03-30 MED ORDER — METOPROLOL SUCCINATE ER 25 MG PO TB24: 25.0000 mg | ORAL_TABLET | Freq: Every day | ORAL | 3 refills | Status: DC

## 2022-03-31 ENCOUNTER — Other Ambulatory Visit: Payer: Self-pay

## 2022-03-31 DIAGNOSIS — I77810 Thoracic aortic ectasia: Secondary | ICD-10-CM

## 2022-04-02 ENCOUNTER — Other Ambulatory Visit: Payer: Self-pay

## 2022-04-02 DIAGNOSIS — I77810 Thoracic aortic ectasia: Secondary | ICD-10-CM

## 2022-04-06 ENCOUNTER — Encounter: Payer: Self-pay | Admitting: Physician Assistant

## 2022-04-06 ENCOUNTER — Ambulatory Visit (INDEPENDENT_AMBULATORY_CARE_PROVIDER_SITE_OTHER): Payer: Commercial Managed Care - PPO | Admitting: Physician Assistant

## 2022-04-06 DIAGNOSIS — F411 Generalized anxiety disorder: Secondary | ICD-10-CM

## 2022-04-06 DIAGNOSIS — F3342 Major depressive disorder, recurrent, in full remission: Secondary | ICD-10-CM | POA: Diagnosis not present

## 2022-04-06 MED ORDER — ALPRAZOLAM 0.5 MG PO TABS: ORAL_TABLET | ORAL | 5 refills | Status: DC

## 2022-04-06 MED ORDER — VENLAFAXINE HCL ER 150 MG PO CP24: 150.0000 mg | ORAL_CAPSULE | Freq: Every day | ORAL | 1 refills | Status: DC

## 2022-04-06 NOTE — Progress Notes (Signed)
[facility] Med Check  Patient ID: AMAAN Alexander,  MRN: [id number]  PCP: Patient, No Pcp Per  Date of Evaluation: 04/06/2022 time spent:20 minutes  Chief Complaint:  Chief Complaint   Anxiety; Depression; Follow-up    HISTORY/CURRENT STATUS: For routine med check.  Doing well. Patient is able to enjoy things.  Energy and motivation are good.  Still works at [employer]. Recently promoted, works 6 days a week and some x 7.  States it is okay though "I do not have too long until I retire."  No extreme sadness, tearfulness, or feelings of hopelessness.  Sleeps well most of the time.  ADLs and personal hygiene are normal.   Denies any changes in concentration, making decisions, or remembering things.  Appetite has not changed.  Weight is stable.  He does still have anxiety at times, uses the Xanax sometimes once a day and sometimes not at all.  Does not have panic attacks but more so a generalized sense of unease like something bad may happen at any time.  Denies suicidal or homicidal thoughts.  Patient denies increased energy with decreased need for sleep, increased talkativeness, racing thoughts, impulsivity or risky behaviors, increased spending, increased libido, grandiosity, increased irritability or anger, paranoia, and no hallucinations.  Denies dizziness, syncope, seizures, numbness, tingling, tremor, tics, unsteady gait, slurred speech, confusion. Denies muscle or joint pain, stiffness, or dystonia.  Individual Medical History/ Review of Systems: Changes? :Yes   vertigo, diagnosed with BPV, doing much better since doing the exercises.  Past medications for mental health diagnoses include: unknown  Allergies: Sulfa antibiotics and Penicillins  Current Medications:  Current Outpatient Medications:    hydrochlorothiazide (HYDRODIURIL) 25 MG tablet, Take 12.5mg  (1/2 tablet) to 25mg  (1 tablet) as needed for swelling., Disp: 30 tablet, Rfl: 3   meclizine (ANTIVERT) 25 MG tablet, Take 1  tablet (25 mg total) by mouth 3 (three) times daily as needed for dizziness., Disp: 30 tablet, Rfl: 0   metoprolol succinate (TOPROL XL) 25 MG 24 hr tablet, Take 1 tablet (25 mg total) by mouth daily., Disp: 90 tablet, Rfl: 3   Multiple Vitamins-Minerals (CENTRUM ADULTS) TABS, Take 1 tablet by mouth daily., Disp: , Rfl:    rosuvastatin (CRESTOR) 10 MG tablet, Take 1 tablet (10 mg total) by mouth daily., Disp: 90 tablet, Rfl: 0   ALPRAZolam (XANAX) 0.5 MG tablet, TAKE ONE TABLET BY MOUTH TWICE A DAY AS NEEDED FOR ANXIETY, Disp: 60 tablet, Rfl: 5   venlafaxine XR (EFFEXOR-XR) 150 MG 24 hr capsule, Take 1 capsule (150 mg total) by mouth daily with breakfast., Disp: 90 capsule, Rfl: 1 Medication Side Effects: none  Family Medical/ Social History: Changes? No  MENTAL HEALTH EXAM:  There were no vitals taken for this visit.There is no height or weight on file to calculate BMI.  General Appearance: Casual, Neat and Well Groomed  Eye Contact:  Good  Speech:  Clear and Coherent and Normal Rate  Volume:  Normal  Mood:  Euthymic  Affect:  Appropriate  Thought Process:  Goal Directed and Descriptions of Associations: Circumstantial  Orientation:  Full (Time, Place, and Person)  Thought Content: Logical   Suicidal Thoughts:  No  Homicidal Thoughts:  No  Memory:  WNL  Judgement:  Good  Insight:  Good  Psychomotor Activity:  Normal  Concentration:  Concentration: Good and Attention Span: Good  Recall:  Good  Fund of Knowledge: Good  Language: Good  Assets:  Communication Skills Desire for Improvement Financial Resources/Insurance Housing  Transportation Vocational/Educational  ADL's:  Intact  Cognition: WNL  Prognosis:  Good   DIAGNOSES:    ICD-10-CM   1. Major depression, recurrent, full remission  F33.42     2. Generalized anxiety disorder  F41.1       Receiving Psychotherapy: No  [name], [facility] in the past.  RECOMMENDATIONS:  PDMP was reviewed.  Xanax filled 3/9/024. I  provided 20 minutes of face to face time during this encounter, including time spent before and after the visit in records review, medical decision making, counseling pertinent to today's visit, and charting.   I am glad to see him doing so well.  No change in treatment needed.  Continue Xanax 0.5 mg, 1/2-1 daily as needed. Continue Effexor XR 150 mg daily. Return in 6 months.  Donnal Moat, PA-C

## 2022-06-24 ENCOUNTER — Other Ambulatory Visit: Payer: Self-pay | Admitting: Cardiovascular Disease

## 2022-08-12 ENCOUNTER — Other Ambulatory Visit: Payer: Self-pay | Admitting: Cardiovascular Disease

## 2022-09-25 ENCOUNTER — Ambulatory Visit: Payer: Commercial Managed Care - PPO | Admitting: Cardiovascular Disease

## 2022-09-26 ENCOUNTER — Emergency Department (HOSPITAL_COMMUNITY): Payer: Commercial Managed Care - PPO

## 2022-09-26 ENCOUNTER — Emergency Department (HOSPITAL_COMMUNITY)
Admission: EM | Admit: 2022-09-26 | Discharge: 2022-09-26 | Disposition: A | Discharge status: Home / Self Care | Payer: Commercial Managed Care - PPO | Attending: Emergency Medicine | Admitting: Emergency Medicine

## 2022-09-26 ENCOUNTER — Other Ambulatory Visit: Payer: Self-pay

## 2022-09-26 DIAGNOSIS — R0981 Nasal congestion: Secondary | ICD-10-CM | POA: Diagnosis present

## 2022-09-26 DIAGNOSIS — U071 COVID-19: Secondary | ICD-10-CM | POA: Insufficient documentation

## 2022-09-26 DIAGNOSIS — J101 Influenza due to other identified influenza virus with other respiratory manifestations: Secondary | ICD-10-CM | POA: Diagnosis not present

## 2022-09-26 DIAGNOSIS — J189 Pneumonia, unspecified organism: Secondary | ICD-10-CM

## 2022-09-26 MED ORDER — AZITHROMYCIN 250 MG PO TABS: 250.0000 mg | ORAL_TABLET | Freq: Every day | ORAL | 0 refills | Status: DC

## 2022-09-26 MED ORDER — CEPHALEXIN 500 MG PO CAPS: 500.0000 mg | ORAL_CAPSULE | Freq: Three times a day (TID) | ORAL | 0 refills | Status: DC

## 2022-09-26 NOTE — ED Provider Notes (Signed)
[facility] AT [facility] Provider Note   CSN: [id number] Arrival date & time: 09/26/22  1526     History Chief Complaint  Patient presents with   chest congestion   Covid Positive    HPI DA BEISEL is a 62 y.o. male presenting for chief complaint of chills and cough.  Diagnosed with COVID on Monday.  States that he had a illness approximately 2 weeks ago that lasted for a week and then resolved for a week and then recurred this past Monday.  Given the bimodal nature of the illness, he was seen in urgent care earlier today given 500 mg Rocephin sent to ER for observation.  Denies fevers chills nausea vomiting syncope shortness of breath at this time.  He is otherwise able tolerating p.o. intake..   Patient's recorded medical, surgical, social, medication list and allergies were reviewed in the Snapshot window as part of the initial history.   Review of Systems   Review of Systems  Constitutional:  Positive for fatigue. Negative for chills and fever.  HENT:  Negative for ear pain and sore throat.   Eyes:  Negative for pain and visual disturbance.  Respiratory:  Positive for cough. Negative for shortness of breath.   Cardiovascular:  Negative for chest pain and palpitations.  Gastrointestinal:  Negative for abdominal pain and vomiting.  Genitourinary:  Negative for dysuria and hematuria.  Musculoskeletal:  Negative for arthralgias and back pain.  Skin:  Negative for color change and rash.  Neurological:  Negative for seizures and syncope.  All other systems reviewed and are negative.   Physical Exam Updated Vital Signs BP (!) 141/82 (BP Location: Left Arm)   Pulse 96   Temp 98.7 F (37.1 C) (Oral)   Resp 18   Ht 5\' 10"  (1.778 m)   Wt 103.4 kg   SpO2 94%   BMI 32.71 kg/m  Physical Exam Vitals and nursing note reviewed.  Constitutional:      General: He is not in acute distress.    Appearance: He is well-developed.  HENT:     Head:  Normocephalic and atraumatic.  Eyes:     Conjunctiva/sclera: Conjunctivae normal.  Cardiovascular:     Rate and Rhythm: Normal rate and regular rhythm.     Heart sounds: No murmur heard. Pulmonary:     Effort: Pulmonary effort is normal. No respiratory distress.     Breath sounds: Normal breath sounds.  Abdominal:     Palpations: Abdomen is soft.     Tenderness: There is no abdominal tenderness.  Musculoskeletal:        General: No swelling.     Cervical back: Neck supple.  Skin:    General: Skin is warm and dry.     Capillary Refill: Capillary refill takes less than 2 seconds.  Neurological:     Mental Status: He is alert.  Psychiatric:        Mood and Affect: Mood normal.      ED Course/ Medical Decision Making/ A&P    Procedures Procedures   Medications Ordered in ED Medications - No data to display  Medical Decision Making:    TAKUMA CANNING is a 62 y.o. male who presented to the ED today with fever cough shortness of breath detailed above.     Complete initial physical exam performed, notably the patient  was hemodynamically stable no acute distress.      Reviewed and confirmed nursing documentation for past medical history,  family history, social history.    Initial Assessment:   Patient presenting with a bimodal viral illness.  Likely postinfectious pneumonia. He has already received Rocephin IM in clinic.  Performed x-ray demonstrates no large consolidation.  Given his history of allergy to penicillin we will treat him with Keflex and azithromycin with plan for reassessment by PCP in the outpatient setting. Disposition:  I have considered need for hospitalization, however, considering all of the above, I believe this patient is stable for discharge at this time.  Patient/family educated about specific return precautions for given chief complaint and symptoms.  Patient/family educated about follow-up with PCP.     Patient/family expressed understanding of  return precautions and need for follow-up. Patient spoken to regarding all imaging and laboratory results and appropriate follow up for these results. All education provided in verbal form with additional information in written form. Time was allowed for answering of patient questions. Patient discharged.    Emergency Department Medication Summary:   Medications - No data to display       Clinical Impression:  1. Community acquired pneumonia of left lower lobe of lung      Discharge   Final Clinical Impression(s) / ED Diagnoses Final diagnoses:  Community acquired pneumonia of left lower lobe of lung    Rx / DC Orders ED Discharge Orders          Ordered    cephALEXin (KEFLEX) 500 MG capsule  3 times daily        09/26/22 1731    azithromycin (ZITHROMAX) 250 MG tablet  Daily        09/26/22 1731              Glyn Ade, MD 09/26/22 1731

## 2022-09-26 NOTE — ED Triage Notes (Signed)
Pt arrived via POV. Was sent over from [facility] for eval of early pneumonia. Was given IM rocephin at [facility] today. + COIVD  C/o chest congestion, chills, and body aches

## 2022-10-06 ENCOUNTER — Encounter: Payer: Self-pay | Admitting: Physician Assistant

## 2022-10-06 ENCOUNTER — Ambulatory Visit (INDEPENDENT_AMBULATORY_CARE_PROVIDER_SITE_OTHER): Payer: Commercial Managed Care - PPO | Admitting: Physician Assistant

## 2022-10-06 DIAGNOSIS — F3342 Major depressive disorder, recurrent, in full remission: Secondary | ICD-10-CM | POA: Diagnosis not present

## 2022-10-06 DIAGNOSIS — F411 Generalized anxiety disorder: Secondary | ICD-10-CM

## 2022-10-06 MED ORDER — ALPRAZOLAM 0.5 MG PO TABS: ORAL_TABLET | ORAL | 5 refills | Status: DC

## 2022-10-06 MED ORDER — VENLAFAXINE HCL ER 150 MG PO CP24: 150.0000 mg | ORAL_CAPSULE | Freq: Every day | ORAL | 1 refills | Status: DC

## 2022-10-06 NOTE — Progress Notes (Signed)
[facility] Med Check  Patient ID: Sean Alexander,  MRN: [id number]  PCP: Patient, No Pcp Per  Date of Evaluation: 10/06/2022 time spent: 21 minutes  Chief Complaint:  Chief Complaint   Anxiety; Depression; Follow-up    HISTORY/CURRENT STATUS: For routine med check.  Doing well. Meds are still effective. Patient is able to enjoy things.  Energy and motivation are good.  Work is going well.   No extreme sadness, tearfulness, or feelings of hopelessness.  Sleeps well. ADLs and personal hygiene are normal.   Denies any changes in concentration, making decisions, or remembering things.  Appetite has not changed.  Weight is stable.  Anxiety is well controlled. Xanax is helpeful when he gets extremely overwhelmed or panicky.  Denies suicidal or homicidal thoughts.  Patient denies increased energy with decreased need for sleep, increased talkativeness, racing thoughts, impulsivity or risky behaviors, increased spending, increased libido, grandiosity, increased irritability or anger, paranoia, or hallucinations.  Denies dizziness, syncope, seizures, numbness, tingling, tremor, tics, unsteady gait, slurred speech, confusion. Denies muscle or joint pain, stiffness, or dystonia.  Individual Medical History/ Review of Systems: Changes? :Yes   had covid, then pneumonia. Out of work for 2 weeks. Better now.   Past medications for mental health diagnoses include: unknown  Allergies: Sulfa antibiotics and Penicillins  Current Medications:  Current Outpatient Medications:    hydrochlorothiazide (HYDRODIURIL) 25 MG tablet, Take 12.5mg  (1/2 tablet) to 25mg  (1 tablet) as needed for swelling., Disp: 30 tablet, Rfl: 3   meclizine (ANTIVERT) 25 MG tablet, Take 1 tablet (25 mg total) by mouth 3 (three) times daily as needed for dizziness., Disp: 30 tablet, Rfl: 0   metoprolol succinate (TOPROL XL) 25 MG 24 hr tablet, Take 1 tablet (25 mg total) by mouth daily., Disp: 90 tablet, Rfl: 3   Multiple  Vitamins-Minerals (CENTRUM ADULTS) TABS, Take 1 tablet by mouth daily., Disp: , Rfl:    rosuvastatin (CRESTOR) 10 MG tablet, TAKE 1 TABLET BY MOUTH DAILY, Disp: 90 tablet, Rfl: 0   ALPRAZolam (XANAX) 0.5 MG tablet, TAKE ONE TABLET BY MOUTH TWICE A DAY AS NEEDED FOR ANXIETY, Disp: 60 tablet, Rfl: 5   venlafaxine XR (EFFEXOR-XR) 150 MG 24 hr capsule, Take 1 capsule (150 mg total) by mouth daily with breakfast., Disp: 90 capsule, Rfl: 1 Medication Side Effects: none  Family Medical/ Social History: Changes? No  MENTAL HEALTH EXAM:  There were no vitals taken for this visit.There is no height or weight on file to calculate BMI.  General Appearance: Casual, Neat and Well Groomed  Eye Contact:  Good  Speech:  Clear and Coherent and Normal Rate  Volume:  Normal  Mood:  Euthymic  Affect:  Appropriate  Thought Process:  Goal Directed and Descriptions of Associations: Circumstantial  Orientation:  Full (Time, Place, and Person)  Thought Content: Logical   Suicidal Thoughts:  No  Homicidal Thoughts:  No  Memory:  WNL  Judgement:  Good  Insight:  Good  Psychomotor Activity:  Normal  Concentration:  Concentration: Good and Attention Span: Good  Recall:  Good  Fund of Knowledge: Good  Language: Good  Assets:  Communication Skills Desire for Improvement Financial Resources/Insurance Housing Resilience Transportation Vocational/Educational  ADL's:  Intact  Cognition: WNL  Prognosis:  Good   DIAGNOSES:    ICD-10-CM   1. Major depression, recurrent, full remission (HCC)  F33.42     2. Generalized anxiety disorder  F41.1       Receiving Psychotherapy: No  Sherron Monday, [facility]  in the past.  RECOMMENDATIONS:  PDMP was reviewed.  Xanax filled9/03/2022. I provided 21 minutes of face to face time during this encounter, including time spent before and after the visit in records review, medical decision making, counseling pertinent to today's visit, and charting.   He's doing well so no med  changes needed.   Continue Xanax 0.5 mg, 1/2-1 daily as needed. Continue Effexor XR 150 mg daily. Return in 6 months.  [name], PA-C

## 2022-11-01 NOTE — Progress Notes (Unsigned)
Cardiology Clinic Note   Patient Name: Sean Alexander Date of Encounter: 11/03/2022  Primary Care Provider:  Patient, No Pcp Per Primary Cardiologist:  Nicki Guadalajara, MD  Patient Profile    Sean Alexander 62 year old male presents the clinic today for follow-up evaluation of his hyperlipidemia and lower extremity edema.  Past Medical History    Past Medical History:  Diagnosis Date   Depression    Hepatitis A virus infection    Kidney Sanders 2017   Kidney stones    Sleep apnea    uses CPAP   Past Surgical History:  Procedure Laterality Date   CHOLECYSTECTOMY N/A 09/02/2016   Procedure: LAPAROSCOPIC CHOLECYSTECTOMY WITH INTRAOPERATIVE CHOLANGIOGRAM;  Surgeon: Avel Peace, MD;  Location: [facility];  Service: General;  Laterality: N/A;   HERNIA REPAIR  1986    Allergies  Allergies  Allergen Reactions   Sulfa Antibiotics Anaphylaxis    Rash on neck and sides   Penicillins Rash    Has patient had a PCN reaction causing immediate rash, facial/tongue/throat swelling, SOB or lightheadedness with hypotension: No Has patient had a PCN reaction causing severe rash involving mucus membranes or skin necrosis: No Has patient had a PCN reaction that required hospitalization: No Has patient had a PCN reaction occurring within the last 10 years: No If all of the above answers are "NO", then may proceed with Cephalosporin use.      History of Present Illness    Sean Alexander is a PMH of OSA on CPAP, hyperlipidemia, ascending aortic dilation, depression, and bilateral lower extremity edema.  His PMH also includes anxiety.  He reestablished care with cardiology 7/22.  He was previously seen by Dr. Tresa Endo in 2016.  During that time he was working at [employer] and having issues with his sleep.  He was working in the afternoon until early morning and reported issues with snoring, nonrestorative sleep, nocturia and was referred for sleep study at that time.  He was found to have mild OSA.   He was started on CPAP.  He was seen in follow-up by Dr. Tresa Endo on 10/28/2020.  During that time he reported that his wife had undergone CABG revascularization.  He was her primary caregiver.  He was not consistently using his CPAP.  His echocardiogram 08/26/2020 showed an EF of 65-70%, mild dilation of the aortic root measuring 43 mm with moderate dilation of the ascending aorta measuring 45 mm.  He was using HCTZ 12.5 mg as needed for lower extremity swelling.  His blood pressure was noted to be 120/84 and his pulse was 64.  He was advised to use his CPAP machine 7 to 8 hours per night.  He presented to the clinic 02/13/22 for follow-up evaluation and stated 2 to 3 weeks ago he noted consistent dizziness.  His imaging was reassuring.  He was doing Epley maneuvers and taking meclizine.  His dizziness was improving.   He was no longer taking his medications.  He reported that he felt well and stopped use.  We reviewed his past medical history and he expressed understanding.  I planned repeat fasting lipids in 3 months after he worked on diet and exercise.  I planned repeat echocardiogram for aortic dilation.  His blood pressure was initially elevated.  I  gave him a blood pressure log to send back to [place] in 2 weeks.  We planned follow-up in 6 months.  I  gave him a list of PCP.  He was seen  in the emergency department on 09/26/2022.  He was diagnosed with community-acquired pneumonia of the left lower lobe.  He had been diagnosed with COVID prior to coming to the ED.  He did report previously receiving Rocephin.  He was prescribed azithromycin and cephalexin.  He was discharged the same day.  He presents to the clinic today for follow-up evaluation and states he has noted more fatigue over the last several weeks and profuse sweating.  His weight is also slightly increased at 232.4 pounds.  He was recently in the emergency department for second round of antibiotics for pneumonia post COVID.  This was at the end of  September.  It appears that his symptoms are related to recovering from COVID and his pneumonia infection.  We reviewed this.  He expressed understanding.  I will order echocardiogram for further prognostication.  Will plan follow-up in 2 to 3 months.  I have given him the salty 6 diet sheet encouraged him to increase his physical activity as tolerated as well.  Today he denies chest pain, fatigue, palpitations, melena, hematuria, hemoptysis, diaphoresis, weakness, presyncope, syncope, orthopnea, and PND.    Home Medications    Prior to Admission medications   Medication Sig Start Date End Date Taking? Authorizing Provider  ALPRAZolam Prudy Feeler) 0.5 MG tablet Take 1 tablet (0.5 mg total) by mouth 2 (two) times daily as needed for anxiety. 08/25/21   Cherie Ouch, PA-C  aspirin 81 MG chewable tablet Chew 81 mg by mouth daily.    [provider]  Cranberry 500 MG CHEW Chew 1 Units by mouth daily. Patient not taking: Reported on 10/28/2020    [provider]  hydrochlorothiazide (HYDRODIURIL) 25 MG tablet Take 12.5mg  (1/2 tablet) to 25mg  (1 tablet) as needed for swelling. 07/30/20   Lennette Bihari, MD  icosapent Ethyl (VASCEPA) 1 g capsule Take 2 capsules (2 g total) by mouth 2 (two) times daily. 10/03/20   Lennette Bihari, MD  meclizine (ANTIVERT) 25 MG tablet Take 1 tablet (25 mg total) by mouth 3 (three) times daily as needed for dizziness. 02/06/22   Alphia Kava, MD  Multiple Vitamins-Minerals (CENTRUM ADULTS) TABS Take 1 tablet by mouth daily.    [provider]  rosuvastatin (CRESTOR) 20 MG tablet Take 1 tablet (20 mg total) by mouth daily. 10/03/20 01/01/21  Lennette Bihari, MD  venlafaxine XR (EFFEXOR XR) 150 MG 24 hr capsule Take 1 capsule (150 mg total) by mouth daily with breakfast. 08/25/21   Cherie Ouch, PA-C    Family History    Family History  Problem Relation Age of Onset   Heart disease Mother    Heart disease Father    Diabetes Brother    Colon  cancer Neg Hx    Colon polyps Neg Hx    He indicated that his mother is alive. He indicated that his father is deceased. He indicated that his sister is alive. He indicated that his brother is deceased. He indicated that his maternal grandmother is deceased. He indicated that his maternal grandfather is deceased. He indicated that his paternal grandmother is deceased. He indicated that the status of his neg hx is unknown.  Social History    Social History   Socioeconomic History   Marital status: Married    Spouse name: Vernona Rieger   Number of children: 0   Years of education: Not on file   Highest education level: Not on file  Occupational History   Occupation: Garment/textile technologist  inspector  Tobacco Use   Smoking status: Never   Smokeless tobacco: Never  Vaping Use   Vaping status: Never Used  Substance and Sexual Activity   Alcohol use: No    Alcohol/week: 0.0 standard drinks of alcohol   Drug use: No   Sexual activity: Not on file  Other Topics Concern   Not on file  Social History Narrative   Not on file   Social Determinants of Health   Financial Resource Strain: Not on file  Food Insecurity: Not on file  Transportation Needs: Not on file  Physical Activity: Not on file  Stress: Not on file  Social Connections: Not on file  Intimate Partner Violence: Not on file     Review of Systems    General:  No chills, fever, night sweats or weight changes.  Cardiovascular:  No chest pain, dyspnea on exertion, edema, orthopnea, palpitations, paroxysmal nocturnal dyspnea. Dermatological: No rash, lesions/masses Respiratory: No cough, dyspnea Urologic: No hematuria, dysuria Abdominal:   No nausea, vomiting, diarrhea, bright red blood per rectum, melena, or hematemesis Neurologic:  No visual changes, wkns, changes in mental status. All other systems reviewed and are otherwise negative except as noted above.  Physical Exam    VS:  BP 136/80 (BP Location: Left Arm, Patient  Position: Sitting, Cuff Size: Normal)   Pulse (!) 59   Ht 5\' 10"  (1.778 m)   Wt 232 lb 6.4 oz (105.4 kg)   BMI 33.35 kg/m  , BMI Body mass index is 33.35 kg/m. GEN: Well nourished, well developed, in no acute distress. HEENT: normal. Neck: Supple, no JVD, carotid bruits, or masses. Cardiac: RRR, no murmurs, rubs, or gallops. No clubbing, cyanosis, trace bilateral lower extremity edema.  Radials/DP/PT 2+ and equal bilaterally.  Respiratory:  Respirations regular and unlabored, clear to auscultation bilaterally. GI: Soft, nontender, nondistended, BS + x 4. MS: no deformity or atrophy. Skin: warm and dry, no rash. Neuro:  Strength and sensation are intact. Psych: Normal affect.  Accessory Clinical Findings    Recent Labs: 02/05/2022: ALT 47 02/06/2022: BUN 11; Creatinine, Ser 0.76; Hemoglobin 15.4; Platelets 199; Potassium 3.8; Sodium 138   Recent Lipid Panel    Component Value Date/Time   CHOL 167 03/27/2022 1139   TRIG 150 (H) 03/27/2022 1139   HDL 34 (L) 03/27/2022 1139   CHOLHDL 4.9 03/27/2022 1139   LDLCALC 106 (H) 03/27/2022 1139         ECG personally reviewed by me today-EKG Interpretation Date/Time:  Tuesday November 03 2022 08:58:40 EDT Ventricular Rate:  59 PR Interval:  198 QRS Duration:  98 QT Interval:  396 QTC Calculation: 392 R Axis:   55  Text Interpretation: Sinus bradycardia When compared with ECG of 06-Feb-2022 14:02, No significant change was found Confirmed by Edd Fabian [phone] on 11/03/2022 9:01:11 AM   Echocardiogram 08/26/2020  IMPRESSIONS     1. Left ventricular ejection fraction, by estimation, is 65 to 70%. The  left ventricle has normal function. The left ventricle has no regional  wall motion abnormalities. There is mild left ventricular hypertrophy.  Left ventricular diastolic parameters  are indeterminate.   2. Right ventricular systolic function is normal. The right ventricular  size is normal.   3. The mitral valve is normal  in structure. Trivial mitral valve  regurgitation.   4. The aortic valve is normal in structure. Aortic valve regurgitation is  trivial.   5. Aortic dilatation noted. There is mild dilatation of the aortic root,  measuring 43 mm. There is moderate dilatation of the ascending aorta,  measuring 45 mm.   FINDINGS   Left Ventricle: Left ventricular ejection fraction, by estimation, is 65  to 70%. The left ventricle has normal function. The left ventricle has no  regional wall motion abnormalities. The left ventricular internal cavity  size was normal in size. There is   mild left ventricular hypertrophy. Left ventricular diastolic parameters  are indeterminate.   Right Ventricle: The right ventricular size is normal. Right vetricular  wall thickness was not assessed. Right ventricular systolic function is  normal.   Left Atrium: Left atrial size was normal in size.   Right Atrium: Right atrial size was normal in size.   Pericardium: There is no evidence of pericardial effusion.   Mitral Valve: The mitral valve is normal in structure. Trivial mitral  valve regurgitation.   Tricuspid Valve: The tricuspid valve is normal in structure. Tricuspid  valve regurgitation is trivial.   Aortic Valve: The aortic valve is normal in structure. Aortic valve  regurgitation is trivial.   Pulmonic Valve: The pulmonic valve was normal in structure. Pulmonic valve  regurgitation is not visualized.   Aorta: Aortic dilatation noted. There is mild dilatation of the aortic  root, measuring 43 mm. There is moderate dilatation of the ascending  aorta, measuring 45 mm.   IAS/Shunts: No atrial level shunt detected by color flow Doppler.   Echocardiogram 03/27/2022   IMPRESSIONS     1. Left ventricular ejection fraction, by estimation, is 60 to 65%. The  left ventricle has normal function. The left ventricle has no regional  wall motion abnormalities. Left ventricular diastolic parameters are   indeterminate. The average left  ventricular global longitudinal strain is -21.8 %. The global longitudinal  strain is normal.   2. Right ventricular systolic function is normal. The right ventricular  size is normal. There is normal pulmonary artery systolic pressure.   3. The mitral valve is normal in structure. No evidence of mitral valve  regurgitation. No evidence of mitral stenosis.   4. The aortic valve is tricuspid. Aortic valve regurgitation is trivial.  No aortic stenosis is present.   5. Compared with the echo 08/2020, the aortic root is stable. Ascending  aorta has increased from 4.5 cm to 4.8 cm. Aortic dilatation noted. There  is mild dilatation of the aortic root, measuring 42 mm. There is moderate  dilatation of the ascending aorta,   measuring 48 mm.   6. The inferior vena cava is normal in size with greater than 50%  respiratory variability, suggesting right atrial pressure of 3 mmHg.   FINDINGS   Left Ventricle: Left ventricular ejection fraction, by estimation, is 60  to 65%. The left ventricle has normal function. The left ventricle has no  regional wall motion abnormalities. The average left ventricular global  longitudinal strain is -21.8 %.  The global longitudinal strain is normal. The left ventricular internal  cavity size was normal in size. There is no left ventricular hypertrophy.  Left ventricular diastolic parameters are indeterminate. Normal left  ventricular filling pressure.   Right Ventricle: The right ventricular size is normal. No increase in  right ventricular wall thickness. Right ventricular systolic function is  normal. There is normal pulmonary artery systolic pressure. The tricuspid  regurgitant velocity is 1.21 m/s, and   with an assumed right atrial pressure of 3 mmHg, the estimated right  ventricular systolic pressure is 8.9 mmHg.   Left Atrium: Left atrial size  was normal in size.   Right Atrium: Right atrial size was normal in size.    Pericardium: Trivial pericardial effusion is present.   Mitral Valve: The mitral valve is normal in structure. No evidence of  mitral valve regurgitation. No evidence of mitral valve stenosis.   Tricuspid Valve: The tricuspid valve is normal in structure. Tricuspid  valve regurgitation is trivial. No evidence of tricuspid stenosis.   Aortic Valve: The aortic valve is tricuspid. Aortic valve regurgitation is  trivial. Aortic regurgitation PHT measures 647 msec. No aortic stenosis is  present.   Pulmonic Valve: The pulmonic valve was normal in structure. Pulmonic valve  regurgitation is not visualized. No evidence of pulmonic stenosis.   Aorta: Compared with the echo 08/2020, the aortic root is stable. Ascending  aorta has increased from 4.5 cm to 4.8 cm. Aortic dilatation noted. There  is mild dilatation of the aortic root, measuring 42 mm. There is moderate  dilatation of the ascending  aorta, measuring 48 mm.   Venous: The inferior vena cava is normal in size with greater than 50%  respiratory variability, suggesting right atrial pressure of 3 mmHg.   IAS/Shunts: No atrial level shunt detected by color flow Doppler.   Assessment & Plan   1.  Fatigue, DOE-reports increased fatigue and dyspnea over the past several weeks.  We reviewed his recent emergency department visit for pneumonia.  Lung sounds clear today. Repeat echocardiogram  Bilateral lower extremity edema-weight today 232.4.  Denies increased DOE or activity intolerance.  Euvolemic. Elevate lower extremities when not active Lower extremity support stockings-reviewed Heart healthy low-sodium diet  Hyperlipidemia-LDL 113 on 08/23/2020.  Previously had stopped taking medication.  At that time he wished to defer medical treatment and work on diet and physical activity.  Heart healthy low-sodium high-fiber diet Increase physical activity as tolerated Repeat fasting lipids and LFTs  OSA-continues to report compliance  with CPAP.  Waking up well rested. Continue CPAP use  Ascending aortic dilation-echocardiogram noted mild dilation of the aortic root measuring 43 mm and moderate dilation of the ascending aorta measuring 45 mm.  Repeat echo 03/27/2022 showed LVEF of 60 to 65%, intermediate diastolic parameters, increased ascending aortic aneurysm which measured 4.8 cm.  Plan for CT angio chest aorta 3/25 Maintain good blood pressure control Continue metoprolol  Essential hypertension-BP today 136/80 Continue metoprolol Maintain low-sodium diet Continue to keep blood pressure log  Dizziness-resolved.  Previously noted intermittent episodes February 24.  Was given meclizine and Epley maneuvers.  Disposition: Follow-up with Dr. Tresa Endo in 2-3 months.  Thomasene Ripple. Jamilia Jacques NP-C     11/03/2022, 9:18 AM [facility] [address] Office [phone] Fax [phone]    I spent 14 minutes examining this patient, reviewing medications, and using patient centered shared decision making involving her cardiac care.  Prior to her visit I spent greater than 20 minutes reviewing her past medical history,  medications, and prior cardiac tests.

## 2022-11-03 ENCOUNTER — Encounter: Payer: Self-pay | Admitting: General Practice

## 2022-11-03 ENCOUNTER — Ambulatory Visit: Payer: Commercial Managed Care - PPO | Attending: General Practice | Admitting: General Practice

## 2022-11-03 VITALS — BP 136/80 | HR 59 | Ht 70.0 in | Wt 232.4 lb

## 2022-11-03 DIAGNOSIS — I77810 Thoracic aortic ectasia: Secondary | ICD-10-CM

## 2022-11-03 DIAGNOSIS — G4733 Obstructive sleep apnea (adult) (pediatric): Secondary | ICD-10-CM | POA: Diagnosis not present

## 2022-11-03 DIAGNOSIS — E782 Mixed hyperlipidemia: Secondary | ICD-10-CM

## 2022-11-03 DIAGNOSIS — R600 Localized edema: Secondary | ICD-10-CM

## 2022-11-03 DIAGNOSIS — I5189 Other ill-defined heart diseases: Secondary | ICD-10-CM

## 2022-11-03 DIAGNOSIS — R5383 Other fatigue: Secondary | ICD-10-CM

## 2022-11-03 DIAGNOSIS — I10 Essential (primary) hypertension: Secondary | ICD-10-CM

## 2022-11-03 NOTE — Patient Instructions (Addendum)
Medication Instructions:  The current medical regimen is effective;  continue present plan and medications as directed. Please refer to the Current Medication list given to you today.  *If you need a refill on your cardiac medications before your next appointment, please call your pharmacy*  Lab Work: NONE  Testing/Procedures: Your physician has requested that you have an echocardiogram. Echocardiography is a painless test that uses sound waves to create images of your heart. It provides your doctor with information about the size and shape of your heart and how well your heart's chambers and valves are working. This procedure takes approximately one hour. There are no restrictions for this procedure. Please do NOT wear cologne, perfume, aftershave, or lotions (deodorant is allowed). Please arrive 15 minutes prior to your appointment time.   Other Instructions MAINTAIN PHYSICAL ACTIVITY-AS TOLERATED PLEASE READ AND FOLLOW ATTACHED  SALTY 6   Follow-Up: At [facility], you and your health needs are our priority.  As part of our continuing mission to provide you with exceptional heart care, we have created designated Provider Care Teams.  These Care Teams include your primary Cardiologist (physician) and Advanced Practice Providers (APPs -  Physician Assistants and Nurse Practitioners) who all work together to provide you with the care you need, when you need it.  Your next appointment:   2-3 month(s)  Provider:   Nicki Guadalajara, MD  or Edd Fabian, FNP

## 2022-12-01 ENCOUNTER — Ambulatory Visit (HOSPITAL_BASED_OUTPATIENT_CLINIC_OR_DEPARTMENT_OTHER)
Admission: RE | Admit: 2022-12-01 | Discharge: 2022-12-01 | Disposition: A | Discharge status: Home / Self Care | Payer: Commercial Managed Care - PPO | Source: Ambulatory Visit | Attending: General Practice | Admitting: General Practice

## 2022-12-01 DIAGNOSIS — R600 Localized edema: Secondary | ICD-10-CM | POA: Diagnosis present

## 2022-12-01 DIAGNOSIS — I5189 Other ill-defined heart diseases: Secondary | ICD-10-CM | POA: Diagnosis present

## 2022-12-01 LAB — ECHOCARDIOGRAM COMPLETE
AR max vel: 4.26 cm2
AV Area VTI: 4.08 cm2
AV Area mean vel: 4.34 cm2
AV Mean grad: 4 mm[Hg]
AV Peak grad: 7.7 mm[Hg]
Ao pk vel: 1.39 m/s
Area-P 1/2: 3.23 cm2
Calc EF: 70.6 %
MV M vel: 1.16 m/s
MV Peak grad: 5.4 mm[Hg]
P 1/2 time: 701 ms
S' Lateral: 2.8 cm
Single Plane A2C EF: 71.6 %
Single Plane A4C EF: 70.2 %

## 2022-12-02 ENCOUNTER — Other Ambulatory Visit: Payer: Self-pay

## 2022-12-02 MED ORDER — ROSUVASTATIN CALCIUM 10 MG PO TABS: 10.0000 mg | ORAL_TABLET | Freq: Every day | ORAL | 3 refills | Status: DC

## 2022-12-22 NOTE — Progress Notes (Signed)
Cardiology Clinic Note   Patient Name: Sean Alexander Date of Encounter: 12/25/2022  Primary Care Provider:  Patient, No Pcp Per Primary Cardiologist:  Nicki Guadalajara, MD  Patient Profile    Sean Alexander 62 year old male presents the clinic today for follow-up evaluation of his hyperlipidemia and lower extremity edema.  Past Medical History    Past Medical History:  Diagnosis Date   Depression    Hepatitis A virus infection    Kidney Schafer 2017   Kidney stones    Sleep apnea    uses CPAP   Past Surgical History:  Procedure Laterality Date   CHOLECYSTECTOMY N/A 09/02/2016   Procedure: LAPAROSCOPIC CHOLECYSTECTOMY WITH INTRAOPERATIVE CHOLANGIOGRAM;  Surgeon: Avel Peace, MD;  Location: [facility];  Service: General;  Laterality: N/A;   HERNIA REPAIR  1986    Allergies  Allergies  Allergen Reactions   Sulfa Antibiotics Anaphylaxis    Rash on neck and sides   Penicillins Rash    Has patient had a PCN reaction causing immediate rash, facial/tongue/throat swelling, SOB or lightheadedness with hypotension: No Has patient had a PCN reaction causing severe rash involving mucus membranes or skin necrosis: No Has patient had a PCN reaction that required hospitalization: No Has patient had a PCN reaction occurring within the last 10 years: No If all of the above answers are "NO", then may proceed with Cephalosporin use.      History of Present Illness    Sean Alexander is a PMH of OSA on CPAP, hyperlipidemia, ascending aortic dilation, depression, and bilateral lower extremity edema.  His PMH also includes anxiety.  He reestablished care with cardiology 7/22.  He was previously seen by Dr. Tresa Endo in 2016.  During that time he was working at [employer] and having issues with his sleep.  He was working in the afternoon until early morning and reported issues with snoring, nonrestorative sleep, nocturia and was referred for sleep study at that time.  He was found to have mild OSA.   He was started on CPAP.  He was seen in follow-up by Dr. Tresa Endo on 10/28/2020.  During that time he reported that his wife had undergone CABG revascularization.  He was her primary caregiver.  He was not consistently using his CPAP.  His echocardiogram 08/26/2020 showed an EF of 65-70%, mild dilation of the aortic root measuring 43 mm with moderate dilation of the ascending aorta measuring 45 mm.  He was using HCTZ 12.5 mg as needed for lower extremity swelling.  His blood pressure was noted to be 120/84 and his pulse was 64.  He was advised to use his CPAP machine 7 to 8 hours per night.  He presented to the clinic 02/13/22 for follow-up evaluation and stated 2 to 3 weeks ago he noted consistent dizziness.  His imaging was reassuring.  He was doing Epley maneuvers and taking meclizine.  His dizziness was improving.   He was no longer taking his medications.  He reported that he felt well and stopped use.  We reviewed his past medical history and he expressed understanding.  I planned repeat fasting lipids in 3 months after he worked on diet and exercise.  I planned repeat echocardiogram for aortic dilation.  His blood pressure was initially elevated.  I  gave him a blood pressure log to send back to [place] in 2 weeks.  We planned follow-up in 6 months.  I  gave him a list of PCP.  He was seen  in the emergency department on 09/26/2022.  He was diagnosed with community-acquired pneumonia of the left lower lobe.  He had been diagnosed with COVID prior to coming to the ED.  He did report previously receiving Rocephin.  He was prescribed azithromycin and cephalexin.  He was discharged the same day.  He presented to the clinic 11/03/22 for follow-up evaluation and stated he had noted more fatigue over the last several weeks and profuse sweating.  His weight was also slightly increased at 232.4 pounds.  He had been in the emergency department for a second round of antibiotics for pneumonia post COVID.  Episode was at the  end of September.  It appeared that his symptoms were related to recovering from COVID and his pneumonia infection.  We reviewed this.  He expressed understanding.  I  ordered echocardiogram for further prognostication.  We planned follow-up in 2 to 3 months.   Echocardiogram 12/01/2022 showed an LVEF of 60-65%, normal diastolic parameters, and no significant valvular abnormalities.  He presents to the clinic today for follow-up evaluation and states he is feeling much better since having COVID-pneumonia.  He has returned to his baseline physical activity.  He reports that he was somewhat slow to recover but is now feeling much better.  We reviewed his echocardiogram.  He expressed understanding.  We reviewed upcoming plan for lipid panel and repeat CT angio chest to evaluate his ascending aortic dilation.  We will plan these test/studies in March and plan follow-up in 6 months.  Today he denies chest pain, fatigue, palpitations, melena, hematuria, hemoptysis, diaphoresis, weakness, presyncope, syncope, orthopnea, and PND.    Home Medications    Prior to Admission medications   Medication Sig Start Date End Date Taking? Authorizing Provider  ALPRAZolam Prudy Feeler) 0.5 MG tablet Take 1 tablet (0.5 mg total) by mouth 2 (two) times daily as needed for anxiety. 08/25/21   Cherie Ouch, PA-C  aspirin 81 MG chewable tablet Chew 81 mg by mouth daily.    [provider]  Cranberry 500 MG CHEW Chew 1 Units by mouth daily. Patient not taking: Reported on 10/28/2020    [provider]  hydrochlorothiazide (HYDRODIURIL) 25 MG tablet Take 12.5mg  (1/2 tablet) to 25mg  (1 tablet) as needed for swelling. 07/30/20   Lennette Bihari, MD  icosapent Ethyl (VASCEPA) 1 g capsule Take 2 capsules (2 g total) by mouth 2 (two) times daily. 10/03/20   Lennette Bihari, MD  meclizine (ANTIVERT) 25 MG tablet Take 1 tablet (25 mg total) by mouth 3 (three) times daily as needed for dizziness. 02/06/22   Alphia Kava, MD  Multiple Vitamins-Minerals (CENTRUM ADULTS) TABS Take 1 tablet by mouth daily.    [provider]  rosuvastatin (CRESTOR) 20 MG tablet Take 1 tablet (20 mg total) by mouth daily. 10/03/20 01/01/21  Lennette Bihari, MD  venlafaxine XR (EFFEXOR XR) 150 MG 24 hr capsule Take 1 capsule (150 mg total) by mouth daily with breakfast. 08/25/21   Cherie Ouch, PA-C    Family History    Family History  Problem Relation Age of Onset   Heart disease Mother    Heart disease Father    Diabetes Brother    Colon cancer Neg Hx    Colon polyps Neg Hx    He indicated that his mother is alive. He indicated that his father is deceased. He indicated that his sister is alive. He indicated that his brother is deceased. He indicated that his maternal  grandmother is deceased. He indicated that his maternal grandfather is deceased. He indicated that his paternal grandmother is deceased. He indicated that the status of his neg hx is unknown.  Social History    Social History   Socioeconomic History   Marital status: Married    Spouse name: Vernona Rieger   Number of children: 0   Years of education: Not on file   Highest education level: Not on file  Occupational History   Occupation: Psychiatrist  Tobacco Use   Smoking status: Never   Smokeless tobacco: Never  Vaping Use   Vaping status: Never Used  Substance and Sexual Activity   Alcohol use: No    Alcohol/week: 0.0 standard drinks of alcohol   Drug use: No   Sexual activity: Not on file  Other Topics Concern   Not on file  Social History Narrative   Not on file   Social Drivers of Health   Financial Resource Strain: Not on file  Food Insecurity: Not on file  Transportation Needs: Not on file  Physical Activity: Not on file  Stress: Not on file  Social Connections: Not on file  Intimate Partner Violence: Not on file     Review of Systems    General:  No chills, fever, night sweats or weight changes.   Cardiovascular:  No chest pain, dyspnea on exertion, edema, orthopnea, palpitations, paroxysmal nocturnal dyspnea. Dermatological: No rash, lesions/masses Respiratory: No cough, dyspnea Urologic: No hematuria, dysuria Abdominal:   No nausea, vomiting, diarrhea, bright red blood per rectum, melena, or hematemesis Neurologic:  No visual changes, wkns, changes in mental status. All other systems reviewed and are otherwise negative except as noted above.  Physical Exam    VS:  BP 126/84 (BP Location: Left Arm, Patient Position: Sitting)   Pulse 61   Ht 5\' 10"  (1.778 m)   Wt 230 lb 6.4 oz (104.5 kg)   SpO2 97%   BMI 33.06 kg/m  , BMI Body mass index is 33.06 kg/m. GEN: Well nourished, well developed, in no acute distress. HEENT: normal. Neck: Supple, no JVD, carotid bruits, or masses. Cardiac: RRR, no murmurs, rubs, or gallops. No clubbing, cyanosis, no edema.  Radials/DP/PT 2+ and equal bilaterally.  Respiratory:  Respirations regular and unlabored, clear to auscultation bilaterally. GI: Soft, nontender, nondistended, BS + x 4. MS: no deformity or atrophy. Skin: warm and dry, no rash. Neuro:  Strength and sensation are intact. Psych: Normal affect.  Accessory Clinical Findings    Recent Labs: 02/05/2022: ALT 47 02/06/2022: BUN 11; Creatinine, Ser 0.76; Hemoglobin 15.4; Platelets 199; Potassium 3.8; Sodium 138   Recent Lipid Panel    Component Value Date/Time   CHOL 167 03/27/2022 1139   TRIG 150 (H) 03/27/2022 1139   HDL 34 (L) 03/27/2022 1139   CHOLHDL 4.9 03/27/2022 1139   LDLCALC 106 (H) 03/27/2022 1139         ECG personally reviewed by me today-none today.   Echocardiogram 08/26/2020  IMPRESSIONS     1. Left ventricular ejection fraction, by estimation, is 65 to 70%. The  left ventricle has normal function. The left ventricle has no regional  wall motion abnormalities. There is mild left ventricular hypertrophy.  Left ventricular diastolic parameters  are  indeterminate.   2. Right ventricular systolic function is normal. The right ventricular  size is normal.   3. The mitral valve is normal in structure. Trivial mitral valve  regurgitation.   4. The aortic valve is normal  in structure. Aortic valve regurgitation is  trivial.   5. Aortic dilatation noted. There is mild dilatation of the aortic root,  measuring 43 mm. There is moderate dilatation of the ascending aorta,  measuring 45 mm.   FINDINGS   Left Ventricle: Left ventricular ejection fraction, by estimation, is 65  to 70%. The left ventricle has normal function. The left ventricle has no  regional wall motion abnormalities. The left ventricular internal cavity  size was normal in size. There is   mild left ventricular hypertrophy. Left ventricular diastolic parameters  are indeterminate.   Right Ventricle: The right ventricular size is normal. Right vetricular  wall thickness was not assessed. Right ventricular systolic function is  normal.   Left Atrium: Left atrial size was normal in size.   Right Atrium: Right atrial size was normal in size.   Pericardium: There is no evidence of pericardial effusion.   Mitral Valve: The mitral valve is normal in structure. Trivial mitral  valve regurgitation.   Tricuspid Valve: The tricuspid valve is normal in structure. Tricuspid  valve regurgitation is trivial.   Aortic Valve: The aortic valve is normal in structure. Aortic valve  regurgitation is trivial.   Pulmonic Valve: The pulmonic valve was normal in structure. Pulmonic valve  regurgitation is not visualized.   Aorta: Aortic dilatation noted. There is mild dilatation of the aortic  root, measuring 43 mm. There is moderate dilatation of the ascending  aorta, measuring 45 mm.   IAS/Shunts: No atrial level shunt detected by color flow Doppler.   Echocardiogram 03/27/2022   IMPRESSIONS     1. Left ventricular ejection fraction, by estimation, is 60 to 65%. The  left  ventricle has normal function. The left ventricle has no regional  wall motion abnormalities. Left ventricular diastolic parameters are  indeterminate. The average left  ventricular global longitudinal strain is -21.8 %. The global longitudinal  strain is normal.   2. Right ventricular systolic function is normal. The right ventricular  size is normal. There is normal pulmonary artery systolic pressure.   3. The mitral valve is normal in structure. No evidence of mitral valve  regurgitation. No evidence of mitral stenosis.   4. The aortic valve is tricuspid. Aortic valve regurgitation is trivial.  No aortic stenosis is present.   5. Compared with the echo 08/2020, the aortic root is stable. Ascending  aorta has increased from 4.5 cm to 4.8 cm. Aortic dilatation noted. There  is mild dilatation of the aortic root, measuring 42 mm. There is moderate  dilatation of the ascending aorta,   measuring 48 mm.   6. The inferior vena cava is normal in size with greater than 50%  respiratory variability, suggesting right atrial pressure of 3 mmHg.   FINDINGS   Left Ventricle: Left ventricular ejection fraction, by estimation, is 60  to 65%. The left ventricle has normal function. The left ventricle has no  regional wall motion abnormalities. The average left ventricular global  longitudinal strain is -21.8 %.  The global longitudinal strain is normal. The left ventricular internal  cavity size was normal in size. There is no left ventricular hypertrophy.  Left ventricular diastolic parameters are indeterminate. Normal left  ventricular filling pressure.   Right Ventricle: The right ventricular size is normal. No increase in  right ventricular wall thickness. Right ventricular systolic function is  normal. There is normal pulmonary artery systolic pressure. The tricuspid  regurgitant velocity is 1.21 m/s, and   with an assumed  right atrial pressure of 3 mmHg, the estimated right  ventricular  systolic pressure is 8.9 mmHg.   Left Atrium: Left atrial size was normal in size.   Right Atrium: Right atrial size was normal in size.   Pericardium: Trivial pericardial effusion is present.   Mitral Valve: The mitral valve is normal in structure. No evidence of  mitral valve regurgitation. No evidence of mitral valve stenosis.   Tricuspid Valve: The tricuspid valve is normal in structure. Tricuspid  valve regurgitation is trivial. No evidence of tricuspid stenosis.   Aortic Valve: The aortic valve is tricuspid. Aortic valve regurgitation is  trivial. Aortic regurgitation PHT measures 647 msec. No aortic stenosis is  present.   Pulmonic Valve: The pulmonic valve was normal in structure. Pulmonic valve  regurgitation is not visualized. No evidence of pulmonic stenosis.   Aorta: Compared with the echo 08/2020, the aortic root is stable. Ascending  aorta has increased from 4.5 cm to 4.8 cm. Aortic dilatation noted. There  is mild dilatation of the aortic root, measuring 42 mm. There is moderate  dilatation of the ascending  aorta, measuring 48 mm.   Venous: The inferior vena cava is normal in size with greater than 50%  respiratory variability, suggesting right atrial pressure of 3 mmHg.   IAS/Shunts: No atrial level shunt detected by color flow Doppler.   Echocardiogram 12/01/2022  MPRESSIONS     1. Left ventricular ejection fraction, by estimation, is 60 to 65%. The  left ventricle has normal function. The left ventricle has no regional  wall motion abnormalities. Left ventricular diastolic parameters were  normal.   2. Right ventricular systolic function is normal. The right ventricular  size is normal.   3. The mitral valve is normal in structure. No evidence of mitral valve  regurgitation. No evidence of mitral stenosis.   4. The aortic valve is normal in structure. Aortic valve regurgitation is  not visualized. No aortic stenosis is present.   5. The inferior vena  cava is normal in size with greater than 50%  respiratory variability, suggesting right atrial pressure of 3 mmHg.   Comparison(s): Echocardiogram done 03/27/22 showed an EF of 60-65%.   FINDINGS   Left Ventricle: Left ventricular ejection fraction, by estimation, is 60  to 65%. The left ventricle has normal function. The left ventricle has no  regional wall motion abnormalities. The left ventricular internal cavity  size was normal in size. There is   no left ventricular hypertrophy. Left ventricular diastolic parameters  were normal.   Right Ventricle: The right ventricular size is normal. No increase in  right ventricular wall thickness. Right ventricular systolic function is  normal.   Left Atrium: Left atrial size was normal in size.   Right Atrium: Right atrial size was normal in size.   Pericardium: There is no evidence of pericardial effusion.   Mitral Valve: The mitral valve is normal in structure. No evidence of  mitral valve regurgitation. No evidence of mitral valve stenosis.   Tricuspid Valve: The tricuspid valve is normal in structure. Tricuspid  valve regurgitation is trivial. No evidence of tricuspid stenosis.   Aortic Valve: The aortic valve is normal in structure. Aortic valve  regurgitation is not visualized. Aortic regurgitation PHT measures 701  msec. No aortic stenosis is present. Aortic valve mean gradient measures  4.0 mmHg. Aortic valve peak gradient  measures 7.7 mmHg. Aortic valve area, by VTI measures 4.08 cm.   Pulmonic Valve: The pulmonic valve was normal  in structure. Pulmonic valve  regurgitation is not visualized. No evidence of pulmonic stenosis.   Aorta: The aortic root is normal in size and structure.   Venous: The inferior vena cava is normal in size with greater than 50%  respiratory variability, suggesting right atrial pressure of 3 mmHg.   IAS/Shunts: No atrial level shunt detected by color flow Doppler.   Assessment & Plan   1.   Fatigue, DOE-has returned to baseline physical activities.  Echocardiogram reassuring 12/01/2022.  Details above.  Previously reported increased fatigue and dyspnea over the past several weeks.  We reviewed his recent emergency department visit for pneumonia.  Lung sounds clear today. Increase physical activity as tolerated Heart healthy low-sodium diet-reviewed  Bilateral lower extremity edema-weight today 230 LBS.  Continues to be euvolemic.   Elevate lower extremities when not active Lower extremity support stockings-reviewed Heart healthy low-sodium diet  Essential hypertension-BP today 126/84 Continue metoprolol Maintain low-sodium diet Continue to keep blood pressure log  Hyperlipidemia-   03/27/2022: Cholesterol, Total 167; HDL 34; LDL Chol Calc (NIH) 106; Triglycerides 150 Previously had stopped taking medication.  At that time he wished to defer medical treatment and work on diet and physical activity.   High-fiber diet Plan for repeat fasting lipids and LFTs 3/25  OSA-continues to be compliant with CPAP.  Waking up well rested. Continue CPAP use  Ascending aortic dilation-echocardiogram noted mild dilation of the aortic root measuring 43 mm and moderate dilation of the ascending aorta measuring 45 mm.  Repeat echo 03/27/2022 showed LVEF of 60 to 65%, intermediate diastolic parameters, increased ascending aortic aneurysm which measured 4.8 cm.  Plan for CT angio chest aorta 3/25 Maintain good blood pressure control Continue metoprolol    Disposition: Follow-up with Dr. Tresa Endo or me in 6 months.  Thomasene Ripple. Erving Sassano NP-C     12/25/2022, 1:55 PM [facility] [address] Office [phone] Fax [phone]    I spent 14 minutes examining this patient, reviewing medications, and using patient centered shared decision making involving her cardiac care.  Prior to her visit I spent greater than 20 minutes reviewing her past medical  history,  medications, and prior cardiac tests.

## 2022-12-25 ENCOUNTER — Encounter: Payer: Self-pay | Admitting: General Practice

## 2022-12-25 ENCOUNTER — Ambulatory Visit: Payer: Commercial Managed Care - PPO | Attending: General Practice | Admitting: General Practice

## 2022-12-25 VITALS — BP 126/84 | HR 61 | Ht 70.0 in | Wt 230.4 lb

## 2022-12-25 DIAGNOSIS — G4733 Obstructive sleep apnea (adult) (pediatric): Secondary | ICD-10-CM

## 2022-12-25 DIAGNOSIS — R0609 Other forms of dyspnea: Secondary | ICD-10-CM

## 2022-12-25 DIAGNOSIS — R5383 Other fatigue: Secondary | ICD-10-CM

## 2022-12-25 DIAGNOSIS — R0600 Dyspnea, unspecified: Secondary | ICD-10-CM | POA: Diagnosis not present

## 2022-12-25 DIAGNOSIS — R600 Localized edema: Secondary | ICD-10-CM | POA: Diagnosis not present

## 2022-12-25 DIAGNOSIS — E7849 Other hyperlipidemia: Secondary | ICD-10-CM

## 2022-12-25 DIAGNOSIS — I10 Essential (primary) hypertension: Secondary | ICD-10-CM

## 2022-12-25 DIAGNOSIS — I77810 Thoracic aortic ectasia: Secondary | ICD-10-CM

## 2022-12-25 DIAGNOSIS — E782 Mixed hyperlipidemia: Secondary | ICD-10-CM

## 2022-12-25 NOTE — Patient Instructions (Addendum)
Medication Instructions:  The current medical regimen is effective;  continue present plan and medications as directed. Please refer to the Current Medication list given to you today.  *If you need a refill on your cardiac medications before your next appointment, please call your pharmacy*  Lab Work: FASTING LIPID AND LFT IN MARCH 2025 If you have labs (blood work) drawn today and your tests are completely normal, you will receive your results only by:  MyChart Message (if you have MyChart) OR A paper copy in the mail If you have any lab test that is abnormal or we need to change your treatment, we will call you to review the results.  Testing/Procedures: SCHEDULE CT ANGIO CHEST AORTA-END OF MARCH 2025  Other Instructions SOMEONE WILL BE CALLING YOU ABOUT YOUR CPAP MACHINE  Follow-Up: At [facility], you and your health needs are our priority.  As part of our continuing mission to provide you with exceptional heart care, we have created designated Provider Care Teams.  These Care Teams include your primary Cardiologist (physician) and Advanced Practice Providers (APPs -  Physician Assistants and Nurse Practitioners) who all work together to provide you with the care you need, when you need it.  Your next appointment:   6 month(s)  Provider:   Nicki Guadalajara, MD

## 2023-01-13 ENCOUNTER — Telehealth: Payer: Self-pay | Admitting: General Practice

## 2023-01-13 NOTE — Telephone Encounter (Signed)
 Pt called in asking about getting a new CPAP machine. He states he spoke to Riggins, NP about it at his last appt. Please advise.

## 2023-01-15 ENCOUNTER — Ambulatory Visit: Payer: Commercial Managed Care - PPO | Admitting: Cardiovascular Disease

## 2023-03-12 ENCOUNTER — Other Ambulatory Visit: Payer: Self-pay

## 2023-03-12 DIAGNOSIS — I77810 Thoracic aortic ectasia: Secondary | ICD-10-CM

## 2023-03-13 LAB — BASIC METABOLIC PANEL
BUN/Creatinine Ratio: 16 (ref 10–24)
BUN: 13 mg/dL (ref 8–27)
CO2: 23 mmol/L (ref 20–29)
Calcium: 9.1 mg/dL (ref 8.6–10.2)
Chloride: 106 mmol/L (ref 96–106)
Creatinine, Ser: 0.81 mg/dL (ref 0.76–1.27)
Glucose: 82 mg/dL (ref 70–99)
Potassium: 4.3 mmol/L (ref 3.5–5.2)
Sodium: 142 mmol/L (ref 134–144)
eGFR: 100 mL/min/{1.73_m2} (ref >59)

## 2023-03-15 ENCOUNTER — Encounter (HOSPITAL_BASED_OUTPATIENT_CLINIC_OR_DEPARTMENT_OTHER): Payer: Self-pay

## 2023-03-26 ENCOUNTER — Ambulatory Visit (HOSPITAL_COMMUNITY)
Admission: RE | Admit: 2023-03-26 | Discharge: 2023-03-26 | Disposition: A | Discharge status: Home / Self Care | Payer: Commercial Managed Care - PPO | Source: Ambulatory Visit | Attending: General Practice | Admitting: General Practice

## 2023-03-26 DIAGNOSIS — I77810 Thoracic aortic ectasia: Secondary | ICD-10-CM | POA: Diagnosis present

## 2023-03-26 MED ORDER — IOHEXOL 350 MG/ML SOLN
95.0000 mL | Freq: Once | INTRAVENOUS | Status: AC | PRN
Start: 2023-03-26 — End: 2023-03-26
  Administered 2023-03-26: 95 mL via INTRAVENOUS

## 2023-04-06 ENCOUNTER — Ambulatory Visit: Payer: Commercial Managed Care - PPO | Admitting: Physician Assistant

## 2023-04-06 ENCOUNTER — Encounter: Payer: Self-pay | Admitting: Physician Assistant

## 2023-04-06 DIAGNOSIS — F3342 Major depressive disorder, recurrent, in full remission: Secondary | ICD-10-CM | POA: Diagnosis not present

## 2023-04-06 DIAGNOSIS — F411 Generalized anxiety disorder: Secondary | ICD-10-CM | POA: Diagnosis not present

## 2023-04-06 MED ORDER — ALPRAZOLAM 0.5 MG PO TABS: ORAL_TABLET | ORAL | 5 refills | Status: DC

## 2023-04-06 MED ORDER — VENLAFAXINE HCL ER 150 MG PO CP24
150.0000 mg | ORAL_CAPSULE | Freq: Every day | ORAL | 1 refills | Status: DC
Start: 2023-04-06 — End: 2023-06-27

## 2023-04-06 NOTE — Progress Notes (Signed)
 [facility]  Patient ID: Sean Alexander,  MRN: [id number]  PCP: Patient, No Pcp Per  Date of Evaluation: 04/06/2023 time spent:20 minutes  Chief Complaint:  Chief Complaint   Anxiety; Depression; Follow-up     HISTORY/CURRENT STATUS: For routine med check.  Patient is able to enjoy things.  Energy and motivation are good.  Work is going well.   No extreme sadness, tearfulness, or feelings of hopelessness.  Sleeps well most of the time. ADLs and personal hygiene are normal.   Denies any changes in concentration, making decisions, or remembering things.  Appetite has not changed.  Weight is stable. Anxiety is well-controlled. Xanax is working.  Denies suicidal or homicidal thoughts.  Patient denies increased energy with decreased need for sleep, increased talkativeness, racing thoughts, impulsivity or risky behaviors, increased spending, increased libido, grandiosity, increased irritability or anger, paranoia, or hallucinations.  Denies dizziness, syncope, seizures, numbness, tingling, tremor, tics, unsteady gait, slurred speech, confusion. Denies muscle or joint pain, stiffness, or dystonia.  Individual Medical History/ Review of Systems: Changes? :No     Past medications for mental health diagnoses include: unknown  Allergies: Sulfa antibiotics and Penicillins  Current Medications:  Current Outpatient Medications:    hydrochlorothiazide (HYDRODIURIL) 25 MG tablet, Take 12.5mg  (1/2 tablet) to 25mg  (1 tablet) as needed for swelling., Disp: 30 tablet, Rfl: 3   metoprolol succinate (TOPROL XL) 25 MG 24 hr tablet, Take 1 tablet (25 mg total) by mouth daily., Disp: 90 tablet, Rfl: 3   Multiple Vitamins-Minerals (CENTRUM ADULTS) TABS, Take 1 tablet by mouth daily., Disp: , Rfl:    rosuvastatin (CRESTOR) 10 MG tablet, Take 1 tablet (10 mg total) by mouth daily., Disp: 90 tablet, Rfl: 3   ALPRAZolam (XANAX) 0.5 MG tablet, TAKE ONE TABLET BY MOUTH TWICE A DAY AS NEEDED FOR  ANXIETY, Disp: 60 tablet, Rfl: 5   meclizine (ANTIVERT) 25 MG tablet, Take 1 tablet (25 mg total) by mouth 3 (three) times daily as needed for dizziness. (Patient not taking: Reported on 04/06/2023), Disp: 30 tablet, Rfl: 0   venlafaxine XR (EFFEXOR-XR) 150 MG 24 hr capsule, Take 1 capsule (150 mg total) by mouth daily with breakfast., Disp: 90 capsule, Rfl: 1 Medication Side Effects: none  Family Medical/ Social History: Changes? No  MENTAL HEALTH EXAM:  There were no vitals taken for this visit.There is no height or weight on file to calculate BMI.  General Appearance: Casual, Neat and Well Groomed  Eye Contact:  Good  Speech:  Clear and Coherent and Normal Rate  Volume:  Normal  Mood:  Euthymic  Affect:  Appropriate  Thought Process:  Goal Directed and Descriptions of Associations: Circumstantial  Orientation:  Full (Time, Place, and Person)  Thought Content: Logical   Suicidal Thoughts:  No  Homicidal Thoughts:  No  Memory:  WNL  Judgement:  Good  Insight:  Good  Psychomotor Activity:  Normal  Concentration:  Concentration: Good and Attention Span: Good  Recall:  Good  Fund of Knowledge: Good  Language: Good  Assets:  Communication Skills Desire for Improvement Financial Resources/Insurance Housing Physical Health Resilience Transportation Vocational/Educational  ADL's:  Intact  Cognition: WNL  Prognosis:  Good   DIAGNOSES:    ICD-10-CM   1. Major depression, recurrent, full remission (HCC)  F33.42     2. Generalized anxiety disorder  F41.1       Receiving Psychotherapy: No  [name], [facility] in the past.  RECOMMENDATIONS:  PDMP was reviewed.  Xanax filled  03/08/2023. I provided 20 minutes of face to face time during this encounter, including time spent before and after the visit in records review, medical decision making, counseling pertinent to today's visit, and charting.   [name] is doing well so no changes need to be made.  Continue Xanax 0.5 mg, 1/2-1 daily  as needed. Continue Effexor XR 150 mg daily. Return in 6 months.  [name], PA-C

## 2023-05-10 ENCOUNTER — Encounter: Payer: Self-pay | Admitting: Cardiovascular Disease

## 2023-05-10 ENCOUNTER — Ambulatory Visit: Payer: Commercial Managed Care - PPO | Attending: Cardiovascular Disease | Admitting: Cardiovascular Disease

## 2023-05-10 DIAGNOSIS — E782 Mixed hyperlipidemia: Secondary | ICD-10-CM

## 2023-05-10 DIAGNOSIS — R600 Localized edema: Secondary | ICD-10-CM | POA: Diagnosis not present

## 2023-05-10 DIAGNOSIS — G4733 Obstructive sleep apnea (adult) (pediatric): Secondary | ICD-10-CM

## 2023-05-10 DIAGNOSIS — I10 Essential (primary) hypertension: Secondary | ICD-10-CM

## 2023-05-10 DIAGNOSIS — E7849 Other hyperlipidemia: Secondary | ICD-10-CM

## 2023-05-10 DIAGNOSIS — F32A Depression, unspecified: Secondary | ICD-10-CM

## 2023-05-10 NOTE — Progress Notes (Signed)
 Cardiology Office Note    Date:  05/14/2023   ID:  Sean Alexander, DOB December 28, 1960, MRN [id number]  PCP:  Patient, No Pcp Per  Cardiologist:  Magnus Schuller, MD   31 month f/u cardiology evaluation    History of Present Illness:  Sean Alexander is a 63 y.o. male who I saw on July 30, 2020 to re-establish cardiology care and last saw him on October 28, 2020.  He presents for 63-month follow-up evaluation.   Sean Alexander has a history of anxiety and depression and has been on Effexor  for over 25 years and alprazolam .  I had seen him in 2016.  At that time he was working at [employer] and was having significant sleep issues.  He typically was working in the afternoon until the early morning and had issues of snoring, nonrestorative sleep, nocturia, and was referred for a sleep study.  He was found to have mild obstructive sleep apnea AHI 5.3R DI of 7.3 but there was absence of REM sleep on his evaluation.  He had significant desaturation to a nadir of 80%.  CPAP was initiated at 8 cm.  I saw him in follow-up on November 21, 2014 at which time his compliance was excellent.  I saw him on July 30, 2020 after not having seen him since 2016.  At that time he expressed that he wanted to establish cardiology care with me. He continued to use CPAP therapy and has his initial machine which is now no longer able to be wireless due to the upgrade to [employer] and his machine is only 3G.  He was on Effexor  for depression and takes alprazolam  as needed.  He denied any chest pain, PND or orthopnea.  He was unaware of palpitations, presyncope or syncope.  At times blood pressure is mildly elevated.  He had not had any recent laboratory.  During that evaluation, I recommended he undergo fasting laboratory and with his sleep apnea recommended a baseline echo to assess systolic and diastolic LV function as well as pulmonary pressure.  We discussed weight loss and increased exercise.  Laboratory in August 2022 showed normal  chemistry profile although ALT was minimally increased at 46.  CBC was stable, TSH 3.29, hemoglobin A1c 5.29.  Lipid studies were abnormal with total cholesterol 201, triglycerides 342, LDL 113, HDL 28 and VLDL 60.  He underwent an echo Doppler study in August 26, 2020.  He had hyperdynamic LV function with EF estimated 65 to 70% without wall motion abnormality.  There was mild LVH.  There was mild dilation of his aortic root at 43 mm and moderate dilation of ascending aorta at 45 mm.  He is now on rosuvastatin  20 mg in addition to Vascepa  2 capsules twice a day.  He also was on HCTZ 12.5 mg as needed for swelling.  He is on aspirin therapy.  He also continues to be on Effexor .  He brought his CPAP machine in the office today.  This is no longer wireless since his set up date was August 2016 and we would need to order a new ResMed air sense 11 CPAP unit.  He states over the last 3 weeks his wife had undergone CABG revascularization surgery.  As result was caring for her oftentimes up at night and not consistently using CPAP and when using treatment average use was minimal.  I interrogated his machine which he brought to the office today which revealed only 9 out of 30 days of  usage with average use of 2.3 hours.  There was a mask leak.  AHI was 5.2.    Since I last saw him, he was seen by Sean Pray, NP on November 03, 2018 for following an ER evaluation after he had developed community-acquired pneumonia and had fatigability.  He subsequently underwent an echo Doppler study on December 01, 2022 which showed an EF at 60 to 65% with normal diastolic parameters and wall motion.  Valves were essentially normal.  Presently, he continues to work at [employer].  He does admit to some swelling of his ankles.  He denies chest pain.  He has been on hydrochlorothiazide  on a as needed basis and takes metoprolol  succinate 25 mg daily.  He was on rosuvastatin  10 mg but he stopped taking approximately 2 months ago.  He  also is on Effexor  XR.  He has an old CPAP machine with a set up date of August 21, 2014 and his machine is not 5G and no longer wireless.  He needs a new machine.  [employer] has been his DME and he has [place] [facility].  He presents for evaluation.  Past Medical History:  Diagnosis Date   Depression    Hepatitis A virus infection    Kidney Dubose 2017   Kidney stones    Sleep apnea    uses CPAP    Past Surgical History:  Procedure Laterality Date   CHOLECYSTECTOMY N/A 09/02/2016   Procedure: LAPAROSCOPIC CHOLECYSTECTOMY WITH INTRAOPERATIVE CHOLANGIOGRAM;  Surgeon: Adalberto Hollow, MD;  Location: [facility];  Service: General;  Laterality: N/A;   HERNIA REPAIR  1986    Current Medications: Outpatient Medications Prior to Visit  Medication Sig Dispense Refill   ALPRAZolam  (XANAX ) 0.5 MG tablet TAKE ONE TABLET BY MOUTH TWICE A DAY AS NEEDED FOR ANXIETY 60 tablet 5   hydrochlorothiazide  (HYDRODIURIL ) 25 MG tablet Take 12.5mg  (1/2 tablet) to 25mg  (1 tablet) as needed for swelling. 30 tablet 3   metoprolol  succinate (TOPROL  XL) 25 MG 24 hr tablet Take 1 tablet (25 mg total) by mouth daily. 90 tablet 3   Multiple Vitamins-Minerals (CENTRUM ADULTS) TABS Take 1 tablet by mouth daily.     venlafaxine  XR (EFFEXOR -XR) 150 MG 24 hr capsule Take 1 capsule (150 mg total) by mouth daily with breakfast. 90 capsule 1   rosuvastatin  (CRESTOR ) 10 MG tablet Take 1 tablet (10 mg total) by mouth daily. (Patient not taking: Reported on 05/10/2023) 90 tablet 3   meclizine  (ANTIVERT ) 25 MG tablet Take 1 tablet (25 mg total) by mouth 3 (three) times daily as needed for dizziness. (Patient not taking: Reported on 04/06/2023) 30 tablet 0   No facility-administered medications prior to visit.     Allergies:   Sulfa antibiotics and Penicillins   Social History   Socioeconomic History   Marital status: Married    Spouse name: [employer]   Number of children: 0   Years of education: Not on file   Highest education  level: Not on file  Occupational History   Occupation: Psychiatrist  Tobacco Use   Smoking status: Never   Smokeless tobacco: Never  Vaping Use   Vaping status: Never Used  Substance and Sexual Activity   Alcohol use: No    Alcohol/week: 0.0 standard drinks of alcohol   Drug use: No   Sexual activity: Not on file  Other Topics Concern   Not on file  Social History Narrative   Not on file   Social Drivers  of Health   Financial Resource Strain: Not on file  Food Insecurity: Not on file  Transportation Needs: Not on file  Physical Activity: Not on file  Stress: Not on file  Social Connections: Not on file    Socially, he lives in [place] but was born in [place].  He got an associate degree from [employer] in aviation maintenance.  He works at [employer].  He is married since 53.  No children.  There is no tobacco history.  Family History:  The patient's family history includes Diabetes in his brother; Heart disease in his father and mother.  His mother died at age 2 with heart failure.  Father died at age 31 and had suffered 2 CVAs.  1 brother died in a boating accident and had diabetes.  1 sister died with lung cancer and was a heavy smoker.  He has 3 living brothers all smoke.  He is the only one of his family who did not smoke.  ROS General: Negative; No fevers, chills, or night sweats;  HEENT: Negative; No changes in vision or hearing, sinus congestion, difficulty swallowing Pulmonary: Negative; No cough, wheezing, shortness of breath, hemoptysis Cardiovascular: Negative; No chest pain, presyncope, syncope, palpitations GI: Negative; No nausea, vomiting, diarrhea, or abdominal pain GU: Negative; No dysuria, hematuria, or difficulty voiding Musculoskeletal: Negative; no myalgias, joint pain, or weakness Hematologic/Oncology: Negative; no easy bruising, bleeding Endocrine: Negative; no heat/cold intolerance; no diabetes Neuro: Negative; no changes in  balance, headaches Skin: Negative; No rashes or skin lesions Psychiatric: Negative; No behavioral problems, depression Sleep: Positive for OSA on CPAP therapy set up date 2016; unaware of breakthrough snoring,  no hypersomnolence, bruxism, restless legs, hypnogognic hallucinations, no cataplexy Other comprehensive 14 point system review is negative.   PHYSICAL EXAM:   VS:  BP 128/78   Pulse 61   Ht 5\' 10"  (1.778 m)   Wt 237 lb 3.2 oz (107.6 kg)   SpO2 96%   BMI 34.03 kg/m     Repeat blood pressure by me was 122/78  Wt Readings from Last 3 Encounters:  05/10/23 237 lb 3.2 oz (107.6 kg)  12/25/22 230 lb 6.4 oz (104.5 kg)  11/03/22 232 lb 6.4 oz (105.4 kg)    General: Alert, oriented, no distress.  Skin: normal turgor, no rashes, warm and dry HEENT: Normocephalic, atraumatic. Pupils equal round and reactive to light; sclera anicteric; extraocular muscles intact; Nose without nasal septal hypertrophy Mouth/Parynx benign; Mallinpatti scale 3 Neck: No JVD, no carotid bruits; normal carotid upstroke Lungs: clear to ausculatation and percussion; no wheezing or rales Chest wall: without tenderness to palpitation Heart: PMI not displaced, RRR, s1 s2 normal, 1/6 systolic murmur, no diastolic murmur, no rubs, gallops, thrills, or heaves Abdomen: soft, nontender; no hepatosplenomehaly, BS+; abdominal aorta nontender and not dilated by palpation. Back: no CVA tenderness Pulses 2+ Musculoskeletal: full range of motion, normal strength, no joint deformities Extremities: no clubbing cyanosis or edema, Homan's sign negative  Neurologic: grossly nonfocal; Cranial nerves grossly wnl Psychologic: Normal mood and affect  Studies/Labs Reviewed:   EKG Interpretation Date/Time:  Monday May 10 2023 08:41:42 EDT Ventricular Rate:  61 PR Interval:  158 QRS Duration:  86 QT Interval:  404 QTC Calculation: 406 R Axis:   -10  Text Interpretation: Normal sinus rhythm Normal ECG When compared with  ECG of 03-Nov-2022 08:58, Questionable change in QRS axis Confirmed by Magnus Schuller ([id number]) on 05/14/2023 12:22:09 PM    October 27, 2020 ECG (independently read by  me): NSR at 63; no ectopy, normal intervas  July 30, 2020 ECG (independently read by me):  NSR at 64; no ectopy, no ST changes, normal intervals  Recent Labs:    Latest Ref Rng & Units 05/10/2023    9:39 AM 03/12/2023    1:22 PM 02/06/2022    2:36 PM  BMP  Glucose 70 - 99 mg/dL 93  82  409   BUN 8 - 27 mg/dL 11  13  11    Creatinine 0.76 - 1.27 mg/dL 8.11  9.14  7.82   BUN/Creat Ratio 10 - 24 14  16     Sodium 134 - 144 mmol/L 140  142  138   Potassium 3.5 - 5.2 mmol/L 4.3  4.3  3.8   Chloride 96 - 106 mmol/L 106  106  106   CO2 20 - 29 mmol/L 21  23  24    Calcium  8.6 - 10.2 mg/dL 9.2  9.1  9.4         Latest Ref Rng & Units 05/10/2023    9:39 AM 02/05/2022   11:05 PM 08/23/2020    8:39 AM  Hepatic Function  Total Protein 6.0 - 8.5 g/dL 6.7  7.5  6.9   Albumin 3.9 - 4.9 g/dL 4.2  4.0  4.3   AST 0 - 40 IU/L 24  31  30    ALT 0 - 44 IU/L 33  47  46   Alk Phosphatase 44 - 121 IU/L 85  72  100   Total Bilirubin 0.0 - 1.2 mg/dL 0.4  0.9  0.3   Bilirubin, Direct 0.0 - 0.2 mg/dL  0.1         Latest Ref Rng & Units 05/10/2023    9:39 AM 02/06/2022    2:36 PM 02/05/2022    8:02 PM  CBC  WBC 3.4 - 10.8 x10E3/uL 6.1  8.8  8.2   Hemoglobin 13.0 - 17.7 g/dL 95.6  21.3  08.6   Hematocrit 37.5 - 51.0 % 40.5  42.3  38.7   Platelets 150 - 450 x10E3/uL 162  199  183    Lab Results  Component Value Date   MCV 93 05/10/2023   MCV 89.4 02/06/2022   MCV 89.8 02/05/2022   Lab Results  Component Value Date   TSH 2.480 05/10/2023   Lab Results  Component Value Date   HGBA1C 5.9 (H) 08/23/2020     BNP No results found for: "BNP"  ProBNP No results found for: "PROBNP"   Lipid Panel     Component Value Date/Time   CHOL 149 05/10/2023 0939   TRIG 201 (H) 05/10/2023 0939   HDL 28 (L) 05/10/2023 0939   CHOLHDL 5.3 (H)  05/10/2023 0939   LDLCALC 87 05/10/2023 0939   LABVLDL 34 05/10/2023 0939     RADIOLOGY: No results found.   Additional studies/ records that were reviewed today include:   Records were reviewed from 2016.   ASSESSMENT:    1. OSA (obstructive sleep apnea)   2. Essential hypertension   3. Mixed hyperlipidemia   4. Bilateral leg edema   5. Depression, unspecified depression type       PLAN:  Mr. Leighton Punches "Geralyn Knee" Schiraldi is a 63 year old gentleman who has a longstanding history of depression and has been on Effexor  for over 25 years followed by Dr. Cherryl Corona.  In 2016, he had symptoms highly suggestive of obstructive sleep apnea with loud snoring, nonrestorative sleep, daytime sleepiness, as well as witnessed apneic  spells and at times awakening gasping for breath.  He underwent a sleep evaluation and during that baseline study he was never able to achieve any REM sleep.  Overall AHI was 5.3 and RDI 7.3/h but is the severity of sleep apnea may have been underestimated since REM sleep did not develop.  He had significant oxygen desaturation to a nadir of 80%.  He has continued to be on CPAP therapy since initiation in 2016.  His ResMed air sense 10 AutoSet unit is no longer wireless due to the [employer] upgrade.  He qualifies for a new machine since his machine is over 32 years old.  However due to supply chain issues there is a significant delay to receive a new ResMed air sense 11 AutoSet unit.  Apparently, he never received a new machine and still has his old machine and is in need for a new machine.  He will also need to be set up with a new DME company based on his current insurance.  He had undergone a 2D echo Doppler study in November 2024 which showed normal systolic function with EF 60 to 65% with normal wall motion and diastolic parameters.  Valves were essentially normal.  His blood pressure today is stable and on repeat by me was 122/78.  He has a prescription for HCTZ to take 12.5 mg  as needed for leg swelling.  Apparently, he had been on rosuvastatin  but stopped taking this several months ago.  He also has been taking metoprolol  succinate 25 mg daily.  He does have some trace ankle edema right greater than left.  I am recommending follow-up laboratory with a comprehensive metabolic panel, CBC, TSH, fasting lipid panel and will also check an LP(a).  I discussed with him my plans for retirement at the end of June.  As result I will not be able to see him for his 41-month compliance after he gets his new was seen.  I will schedule him a 73-month evaluation to see Dr. Micael Adas for compliance assessment regarding his sleep apnea and potential pressure adjustment with his new machine. Medication Adjustments/Labs and Tests Ordered: Current medicines are reviewed at length with the patient today.  Concerns regarding medicines are outlined above.  Medication changes, Labs and Tests ordered today are listed in the Patient Instructions below. Patient Instructions  Medication Instructions:  Your physician recommends that you continue on your current medications as directed. Please refer to the Current Medication list given to you today.    *If you need a refill on your cardiac medications before your next appointment, please call your pharmacy*   Lab Work: CMET CBC TSH LIPID PANEL Lpa     If you have labs (blood work) drawn today and your tests are completely normal, you will receive your results only by: MyChart Message (if you have MyChart) OR A paper copy in the mail If you have any lab test that is abnormal or we need to change your treatment, we will call you to review the results.   Testing/Procedures: None    Follow-Up: At [facility], you and your health needs are our priority.  As part of our continuing mission to provide you with exceptional heart care, we have created designated Provider Care Teams.  These Care Teams include your primary Cardiologist (physician)  and Advanced Practice Providers (APPs -  Physician Assistants and Nurse Practitioners) who all work together to provide you with the care you need, when you need it.  We recommend signing  up for the patient portal called "MyChart".  Sign up information is provided on this After Visit Summary.  MyChart is used to connect with patients for Virtual Visits (Telemedicine).  Patients are able to view lab/test results, encounter notes, upcoming appointments, etc.  Non-urgent messages can be sent to your provider as well.   To learn more about what you can do with MyChart, go to [email or web address].    Your next appointment:   3 month(s)  The format for your next appointment:   In Person  Provider:   TRACI TURNER    Other Instructions  IF YOU HAVE ADDITIONAL QUESTIONS REGARDING SLEEP/CPAP YOU CAN CONTACT Enedina Harrow (SLEEP COORDINATOR) [phone]   Signed, Magnus Schuller, MD , [facility], ABSM Diplomat, American Board of Sleep Medicine  05/14/2023 12:37 PM    [facility] [address], [place], [place]  [address] Phone: [phone]

## 2023-05-10 NOTE — Patient Instructions (Signed)
 Medication Instructions:  Your physician recommends that you continue on your current medications as directed. Please refer to the Current Medication list given to you today.    *If you need a refill on your cardiac medications before your next appointment, please call your pharmacy*   Lab Work: CMET CBC TSH LIPID PANEL Lpa     If you have labs (blood work) drawn today and your tests are completely normal, you will receive your results only by: MyChart Message (if you have MyChart) OR A paper copy in the mail If you have any lab test that is abnormal or we need to change your treatment, we will call you to review the results.   Testing/Procedures: None    Follow-Up: At [facility], you and your health needs are our priority.  As part of our continuing mission to provide you with exceptional heart care, we have created designated Provider Care Teams.  These Care Teams include your primary Cardiologist (physician) and Advanced Practice Providers (APPs -  Physician Assistants and Nurse Practitioners) who all work together to provide you with the care you need, when you need it.  We recommend signing up for the patient portal called "MyChart".  Sign up information is provided on this After Visit Summary.  MyChart is used to connect with patients for Virtual Visits (Telemedicine).  Patients are able to view lab/test results, encounter notes, upcoming appointments, etc.  Non-urgent messages can be sent to your provider as well.   To learn more about what you can do with MyChart, go to [email or web address].    Your next appointment:   3 month(s)  The format for your next appointment:   In Person  Provider:   TRACI TURNER    Other Instructions  IF YOU HAVE ADDITIONAL QUESTIONS REGARDING SLEEP/CPAP YOU CAN CONTACT Enedina Harrow (SLEEP COORDINATOR) [phone]

## 2023-05-11 LAB — CBC
Hematocrit: 40.5 % (ref 37.5–51.0)
Hemoglobin: 13.7 g/dL (ref 13.0–17.7)
MCH: 31.6 pg (ref 26.6–33.0)
MCHC: 33.8 g/dL (ref 31.5–35.7)
MCV: 93 fL (ref 79–97)
Platelets: 162 10*3/uL (ref 150–450)
RBC: 4.34 x10E6/uL (ref 4.14–5.80)
RDW: 12.6 % (ref 11.6–15.4)
WBC: 6.1 10*3/uL (ref 3.4–10.8)

## 2023-05-11 LAB — LIPID PANEL
Chol/HDL Ratio: 5.3 ratio — ABNORMAL HIGH (ref 0.0–5.0)
Cholesterol, Total: 149 mg/dL (ref 100–199)
HDL: 28 mg/dL — ABNORMAL LOW (ref >39)
LDL Chol Calc (NIH): 87 mg/dL (ref 0–99)
Triglycerides: 201 mg/dL — ABNORMAL HIGH (ref 0–149)
VLDL Cholesterol Cal: 34 mg/dL (ref 5–40)

## 2023-05-11 LAB — COMPREHENSIVE METABOLIC PANEL WITH GFR
ALT: 33 IU/L (ref 0–44)
AST: 24 IU/L (ref 0–40)
Albumin: 4.2 g/dL (ref 3.9–4.9)
Alkaline Phosphatase: 85 IU/L (ref 44–121)
BUN/Creatinine Ratio: 14 (ref 10–24)
BUN: 11 mg/dL (ref 8–27)
Bilirubin Total: 0.4 mg/dL (ref 0.0–1.2)
CO2: 21 mmol/L (ref 20–29)
Calcium: 9.2 mg/dL (ref 8.6–10.2)
Chloride: 106 mmol/L (ref 96–106)
Creatinine, Ser: 0.77 mg/dL (ref 0.76–1.27)
Globulin, Total: 2.5 g/dL (ref 1.5–4.5)
Glucose: 93 mg/dL (ref 70–99)
Potassium: 4.3 mmol/L (ref 3.5–5.2)
Sodium: 140 mmol/L (ref 134–144)
Total Protein: 6.7 g/dL (ref 6.0–8.5)
eGFR: 102 mL/min/{1.73_m2} (ref >59)

## 2023-05-11 LAB — LIPOPROTEIN A (LPA): Lipoprotein (a): 9.6 nmol/L (ref <75.0)

## 2023-05-11 LAB — TSH: TSH: 2.48 u[IU]/mL (ref 0.450–4.500)

## 2023-05-14 ENCOUNTER — Telehealth: Payer: Self-pay

## 2023-05-14 ENCOUNTER — Encounter: Payer: Self-pay | Admitting: Cardiovascular Disease

## 2023-05-14 NOTE — Telephone Encounter (Signed)
**Note De-Identified Marge Vandermeulen Obfuscation** The pt states that he was advised to call Brandie back if he had not heard from the DME company by Monday or Tuesday of this week concerning his new CPAP machine. He states that no one has contacted him and he is requesting to s/w Brandie.  I advised the pt that she is not in the office this afternoon but will be back on Monday 5/12. He is aware that I am sending this message to Brandie and that she will be calling him back.  He thanked me for taking his call.

## 2023-05-18 NOTE — Telephone Encounter (Signed)
 Spoke with patient and he would like a call back regarding CPAP machine. He states its been 5 days and no one has called him back yet.

## 2023-05-18 NOTE — Telephone Encounter (Signed)
 Patient following up regarding new CPAP machine requesting a call back this afternoon if possible.

## 2023-05-19 NOTE — Addendum Note (Signed)
 Addended by: Maceo Sax on: 05/19/2023 10:38 AM   Modules accepted: Orders

## 2023-05-19 NOTE — Telephone Encounter (Signed)
 Notified patient via VM that order has been sent to [employer] again. Confirmation of received order, [employer] will reach out to patient

## 2023-05-20 ENCOUNTER — Ambulatory Visit: Payer: Self-pay

## 2023-05-20 ENCOUNTER — Encounter: Payer: Self-pay | Admitting: Cardiovascular Disease

## 2023-06-07 ENCOUNTER — Encounter: Payer: Self-pay | Admitting: Urology

## 2023-06-14 ENCOUNTER — Other Ambulatory Visit: Payer: Self-pay

## 2023-06-14 ENCOUNTER — Encounter (HOSPITAL_COMMUNITY): Payer: Self-pay

## 2023-06-14 ENCOUNTER — Emergency Department (HOSPITAL_COMMUNITY)
Admission: EM | Admit: 2023-06-14 | Discharge: 2023-06-15 | Disposition: A | Discharge status: Other Acute Inpatient Hospital | Source: Home / Self Care | Attending: Emergency Medicine | Admitting: Emergency Medicine

## 2023-06-14 DIAGNOSIS — R4689 Other symptoms and signs involving appearance and behavior: Secondary | ICD-10-CM | POA: Insufficient documentation

## 2023-06-14 DIAGNOSIS — R45851 Suicidal ideations: Secondary | ICD-10-CM | POA: Insufficient documentation

## 2023-06-14 DIAGNOSIS — F332 Major depressive disorder, recurrent severe without psychotic features: Secondary | ICD-10-CM | POA: Diagnosis not present

## 2023-06-14 LAB — COMPREHENSIVE METABOLIC PANEL WITH GFR
ALT: 42 U/L (ref 0–44)
AST: 30 U/L (ref 15–41)
Albumin: 4.2 g/dL (ref 3.5–5.0)
Alkaline Phosphatase: 77 U/L (ref 38–126)
Anion gap: 8 (ref 5–15)
BUN: 10 mg/dL (ref 8–23)
CO2: 25 mmol/L (ref 22–32)
Calcium: 9.8 mg/dL (ref 8.9–10.3)
Chloride: 109 mmol/L (ref 98–111)
Creatinine, Ser: 0.91 mg/dL (ref 0.61–1.24)
GFR, Estimated: >60 mL/min (ref >60)
Glucose, Bld: 127 mg/dL — ABNORMAL HIGH (ref 70–99)
Potassium: 4 mmol/L (ref 3.5–5.1)
Sodium: 142 mmol/L (ref 135–145)
Total Bilirubin: 1.2 mg/dL (ref 0.0–1.2)
Total Protein: 7.4 g/dL (ref 6.5–8.1)

## 2023-06-14 LAB — CBC WITH DIFFERENTIAL/PLATELET
Abs Immature Granulocytes: 0.05 10*3/uL (ref 0.00–0.07)
Basophils Absolute: 0.1 10*3/uL (ref 0.0–0.1)
Basophils Relative: 1 %
Eosinophils Absolute: 0.1 10*3/uL (ref 0.0–0.5)
Eosinophils Relative: 1 %
HCT: 43.6 % (ref 39.0–52.0)
Hemoglobin: 14.8 g/dL (ref 13.0–17.0)
Immature Granulocytes: 1 %
Lymphocytes Relative: 26 %
Lymphs Abs: 2.7 10*3/uL (ref 0.7–4.0)
MCH: 31.4 pg (ref 26.0–34.0)
MCHC: 33.9 g/dL (ref 30.0–36.0)
MCV: 92.6 fL (ref 80.0–100.0)
Monocytes Absolute: 1 10*3/uL (ref 0.1–1.0)
Monocytes Relative: 10 %
Neutro Abs: 6.4 10*3/uL (ref 1.7–7.7)
Neutrophils Relative %: 61 %
Platelets: 182 10*3/uL (ref 150–400)
RBC: 4.71 MIL/uL (ref 4.22–5.81)
RDW: 12.3 % (ref 11.5–15.5)
WBC: 10.2 10*3/uL (ref 4.0–10.5)
nRBC: 0 % (ref 0.0–0.2)

## 2023-06-14 LAB — RAPID URINE DRUG SCREEN, HOSP PERFORMED
Amphetamines: NOT DETECTED
Barbiturates: NOT DETECTED
Benzodiazepines: POSITIVE — AB
Cocaine: NOT DETECTED
Opiates: NOT DETECTED
Tetrahydrocannabinol: NOT DETECTED

## 2023-06-14 LAB — ETHANOL: Alcohol, Ethyl (B): <15 mg/dL (ref <15)

## 2023-06-14 MED ORDER — ONDANSETRON HCL 4 MG PO TABS: 4.0000 mg | ORAL_TABLET | Freq: Three times a day (TID) | ORAL | Status: DC | PRN

## 2023-06-14 MED ORDER — ALPRAZOLAM 0.25 MG PO TABS
0.5000 mg | ORAL_TABLET | Freq: Two times a day (BID) | ORAL | Status: DC | PRN
  Administered 2023-06-15: 0.5 mg via ORAL
  Filled 2023-06-14: qty 2

## 2023-06-14 MED ORDER — ACETAMINOPHEN 325 MG PO TABS: 650.0000 mg | ORAL_TABLET | ORAL | Status: DC | PRN

## 2023-06-14 MED ORDER — ALUM & MAG HYDROXIDE-SIMETH 200-200-20 MG/5ML PO SUSP: 30.0000 mL | Freq: Four times a day (QID) | ORAL | Status: DC | PRN

## 2023-06-14 MED ORDER — ZOLPIDEM TARTRATE 5 MG PO TABS: 5.0000 mg | ORAL_TABLET | Freq: Every evening | ORAL | Status: DC | PRN

## 2023-06-14 NOTE — ED Provider Notes (Signed)
  EMERGENCY DEPARTMENT AT [facility] Provider Note   CSN: [id number] Arrival date & time: 06/14/23  1854     History  Chief Complaint  Patient presents with   Suicidal    Sean Alexander is a 63 y.o. male.   63 year old male brought in by friend after he went to a park with a farearm with intention to end his life. Reports suicidal thoughts for the past few weeks.        Home Medications Prior to Admission medications   Medication Sig Start Date End Date Taking? Authorizing Provider  ALPRAZolam  (XANAX ) 0.5 MG tablet TAKE ONE TABLET BY MOUTH TWICE A DAY AS NEEDED FOR ANXIETY 04/06/23   Marvia Slocumb T, PA-C  hydrochlorothiazide  (HYDRODIURIL ) 25 MG tablet Take 12.5mg  (1/2 tablet) to 25mg  (1 tablet) as needed for swelling. 07/30/20   Millicent Ally, MD  metoprolol  succinate (TOPROL  XL) 25 MG 24 hr tablet Take 1 tablet (25 mg total) by mouth daily. 03/30/22   Goodrich, Callie E, PA-C  Multiple Vitamins-Minerals (CENTRUM ADULTS) TABS Take 1 tablet by mouth daily.    [provider]  rosuvastatin  (CRESTOR ) 10 MG tablet Take 1 tablet (10 mg total) by mouth daily. Patient not taking: Reported on 05/10/2023 12/02/22   Millicent Ally, MD  venlafaxine  XR (EFFEXOR -XR) 150 MG 24 hr capsule Take 1 capsule (150 mg total) by mouth daily with breakfast. 04/06/23   Marvia Slocumb T, PA-C      Allergies    Sulfa antibiotics and Penicillins    Review of Systems   Review of Systems  Physical Exam Updated Vital Signs BP (!) 134/98 (BP Location: Right Arm)   Pulse 97   Temp 98.3 F (36.8 C)   Resp 16   Ht 5\' 10"  (1.778 m)   Wt 107.6 kg   SpO2 98%   BMI 34.04 kg/m  Physical Exam Vitals and nursing note reviewed.  Constitutional:      General: He is not in acute distress.    Appearance: He is well-developed. He is not diaphoretic.  HENT:     Head: Normocephalic and atraumatic.  Cardiovascular:     Rate and Rhythm: Normal rate and regular rhythm.     Heart  sounds: Normal heart sounds.  Pulmonary:     Effort: Pulmonary effort is normal.     Breath sounds: Normal breath sounds.  Skin:    General: Skin is warm and dry.  Neurological:     Mental Status: He is alert and oriented to person, place, and time.  Psychiatric:        Behavior: Behavior normal.     ED Results / Procedures / Treatments   Labs (all labs ordered are listed, but only abnormal results are displayed) Labs Reviewed  RAPID URINE DRUG SCREEN, HOSP PERFORMED - Abnormal; Notable for the following components:      Result Value   Benzodiazepines POSITIVE (*)    All other components within normal limits  CBC WITH DIFFERENTIAL/PLATELET  COMPREHENSIVE METABOLIC PANEL WITH GFR  ETHANOL    EKG None  Radiology No results found.  Procedures Procedures    Medications Ordered in ED Medications  acetaminophen  (TYLENOL ) tablet 650 mg (has no administration in time range)  zolpidem (AMBIEN) tablet 5 mg (has no administration in time range)  alum & mag hydroxide-simeth (MAALOX/MYLANTA) 200-200-20 MG/5ML suspension 30 mL (has no administration in time range)  ondansetron  (ZOFRAN ) tablet 4 mg (has no administration in time  range)  ALPRAZolam  (XANAX ) tablet 0.5 mg (has no administration in time range)    ED Course/ Medical Decision Making/ A&P                                 Medical Decision Making Amount and/or Complexity of Data Reviewed Labs: ordered.  Risk OTC drugs. Prescription drug management.   This patient presents to the ED for concern of SI, this involves an extensive number of treatment options, and is a complaint that carries with it a high risk of complications and morbidity.  The differential diagnosis includes but not limited to psychosis    Co morbidities / Chronic conditions that complicate the patient evaluation  Hepatitis A, depression, kidney stones, sleep apnea    Additional history obtained:  Additional history obtained from EMR Friend  at bedside assists with history as above External records from outside source obtained and reviewed including history on file   Lab Tests:  I Ordered, and personally interpreted labs.  The pertinent results include:  UDS positive for benzos (prescribed), CBC WNL. CMP and etoh pending at time of sign out.    Cardiac Monitoring: / EKG:  The patient was maintained on a cardiac monitor.  I personally viewed and interpreted the cardiac monitored which showed an underlying rhythm of: Normal sinus rhythm, rate 91   Problem List / ED Course / Critical interventions / Medication management  63 year old male brought in by friend after he was found with a gun in a park with plans to shoot self. Patient denies active SI at this moment. Given gravity of the situation today, IVC papers completed for safety of patient and those around him with consult to behavioral health requested.  I ordered medication including home alprazolam     I have reviewed the patients home medicines and have made adjustments as needed   Consultations Obtained:  I requested consultation with the behavioral health team,  and discussed lab and imaging findings as well as pertinent plan - they recommend: Consult pending at time of signout.   Social Determinants of Health:  Has PCP   Test / Admission - Considered:  Medically cleared for behavioral health evaluation and disposition         Final Clinical Impression(s) / ED Diagnoses Final diagnoses:  None    Rx / DC Orders ED Discharge Orders     None         Darlis Eisenmenger, PA-C 06/14/23 2146    Flonnie Humphrey, DO 06/15/23 0041

## 2023-06-14 NOTE — BH Assessment (Signed)
 Comprehensive Clinical Assessment (CCA) Note  06/14/2023 Sean Alexander [id number]  Chief Complaint:  Chief Complaint  Patient presents with   Suicidal  Disposition: Per Alba Ally patient is recommended for inpatient admission.   Disposition SW to pursue appropriate inpatient options.  The patient demonstrates the following risk factors for suicide: Chronic risk factors for suicide include: psychiatric disorder of MDD. Acute risk factors for suicide include: family or marital conflict. Protective factors for this patient include: responsibility to others (children, family) and hope for the future. Considering these factors, the overall suicide risk at this point appears to be high. Patient is not appropriate for outpatient follow up.  Sean Alexander "Sean Alexander" is a 63 year old male who presents to [facility] voluntarily and accompanied by his friend due to SI. Patient reports he has been experiencing increased depression symptoms for the past few weeks and today he decided that he was going to end his life. He reports he woke up early this morning, got dressed for work,grabbed his gun and instead of going to work he drove around for several hours. He reports he went to different places looking for a place to kill himself. He reports stopping at a park and was going to follow through with his plan. He reports his friend/co-worker realized he did not come to work and tracked his location on his phone and found him. He states if his friend did not show up when he did "we would not be talking right now". He reports ongoing stress due to feeling inadequate and feeling like he does not do enough to help his wife. He states about 2 years ago his wife had a procedure on her heart and this caused a decline in her health. He reports about 3 weeks ago, his wife's heart rate was extremely low and this caused him to worry about her. He reports feeling selfish and guilty about feeling depressed because of what his wife has to  deal with.He reports this causes an emotional cycle that results in ongoing suicidal ideation. He denies any past suicide attempts but reports he does have a history of inpatient admissions. He denies substance abuse, HI,paranoia and AVH. He is unable to contract for safety.   Patient reports anhedonia, hopelessness, guilt, loss of interest to do things they enjoy, fatigue, lack of concentration, worthlessness, and decreased appetite.  Patient denies history of abuse or trauma. Patient denies current legal problems. Patient is receiving outpatient psychiatry services, with Erin Havers at [place]. Patient reports he  takes his medications as prescribed (see MAR) and denies recent medication changes. Patient denies access to weapons.   Patient is unable to to contract for safety outside of the hospital. Treatment options were discussed and patient is in agreement with recommendation for inpatient admission.      Visit Diagnosis:  Suicidal Ideation Major depressive disorder, recurrent, severe    CCA Screening, Triage and Referral (STR)  Patient Reported Information How did you hear about us ? Family/Friend  What Is the Reason for Your Visit/Call Today? Sean Alexander "Sean Alexander" is a 63 year old male who presents to [facility] voluntarily and accompanied by his friend due to SI. Patient reports he has been experiencing increased depression symptoms for the past few weeks and today he decided that he was going to end his life. He reports he woke up early this morning, got dressed for work,grabbed his gun and instead of going to work he drove around for several hours. He reports he went to different  places looking for a place to kill himself. He reports stopping at a park and was going to follow through with his plan. He reports his friend/co-worker realized he did not come to work and tracked his location on his phone and found him. He states if his friend did not show up when he did "we would not be talking  right now". He reports ongoing stress due to feeling inadequate and feeling like he does not do enough to help his wife. He states about 2 years ago his wife had a procedure on her heart and this caused a decline in her health. He reports about 3 weeks ago, his wife's heart rate was extremely low and this caused him to worry about her. He reports feeling selfish and guilty about feeling depressed because of what his wife has to deal with.He reports this causes an emotional cycle that results in ongoing suicidal ideation. He denies any past suicide attempts but reports he does have a history of inpatient admissions. He denies substance abuse, HI,paranoia and AVH. He is unable to contract for safety.  How Long Has This Been Causing You Problems? 1 wk - 1 month  What Do You Feel Would Help You the Most Today? Treatment for Depression or other mood problem; Medication(s)   Have You Recently Had Any Thoughts About Hurting Yourself? Yes  Are You Planning to Commit Suicide/Harm Yourself At This time? Yes   Flowsheet Row ED from 06/14/2023 in [facility] Emergency Department at [facility] ED from 09/26/2022 in [facility] Department at [facility] ED from 02/06/2022 in [facility] Emergency Department at [facility]  C-SSRS RISK CATEGORY High Risk No Risk No Risk       Have you Recently Had Thoughts About Hurting Someone Sean Alexander? No  Are You Planning to Harm Someone at This Time? No  Explanation: denies HI   Have You Used Any Alcohol or Drugs in the Past 24 Hours? No  How Long Ago Did You Use Drugs or Alcohol? N/A What Did You Use and How Much? N/A  Do You Currently Have a Therapist/Psychiatrist? Yes  Name of Therapist/Psychiatrist: Name of Therapist/Psychiatrist: Erin Havers @ [facility] for medication management   Have You Been Recently Discharged From Any Office Practice or Programs? No  Explanation of Discharge From Practice/Program: N/A    CCA  Screening Triage Referral Assessment Type of Contact: Tele-Assessment  Telemedicine Service Delivery: Telemedicine service delivery: This service was provided via telemedicine using a 2-way, interactive audio and video technology  Is this Initial or Reassessment? Is this Initial or Reassessment?: Initial Assessment  Date Telepsych consult ordered in [facility]:  Date Telepsych consult ordered in [facility]: 06/14/23  Time Telepsych consult ordered in [facility]:  Time Telepsych consult ordered in [facility]: 2040  Location of Assessment: [facility] ED  Provider Location: [facility] Assessment Services   Collateral Involvement: n/a   Does Patient Have a Automotive engineer Guardian? -- (n/a)  Legal Guardian Contact Information: n/a  Copy of Legal Guardianship Form: -- (n/a)  Legal Guardian Notified of Arrival: -- (n/a)  Legal Guardian Notified of Pending Discharge: -- (n/a)  If Minor and Not Living with Parent(s), Who has Custody? n/a  Is CPS involved or ever been involved? Never  Is APS involved or ever been involved? Never   Patient Determined To Be At Risk for Harm To Self or Others Based on Review of Patient Reported Information or Presenting Complaint? Yes, for Self-Harm  Method: Plan  with intent and identified person  Availability of Means: No access or NA  Intent: Clearly intends on inflicting harm that could cause death  Notification Required: No need or identified person  Additional Information for Danger to Others Potential: -- (n/a)  Additional Comments for Danger to Others Potential: He reported he had a gun and was in a park with intentions to kill himself.  Are There Guns or Other Weapons in Your Home? No  Types of Guns/Weapons: weapon taken from patient  Are These Weapons Safely Secured?                            Yes  Who Could Verify You Are Able To Have These Secured: Giordan, [place] (Spouse)  [phone]  Do You Have any Outstanding Charges, Pending Court Dates, Parole/Probation?  denies  Contacted To Inform of Risk of Harm To Self or Others: Family/Significant Other:    Does Patient Present under Involuntary Commitment? No    [place] of Residence: [place]   Patient Currently Receiving the Following Services: Medication Management   Determination of Need: Urgent (48 hours)   Options For Referral: Inpatient Hospitalization     CCA Biopsychosocial Patient Reported Schizophrenia/Schizoaffective Diagnosis in Past: No   Strengths: Seeking Treatment, Cooperation in assessment.   Mental Health Symptoms Depression:  Change in energy/activity; Difficulty Concentrating; Fatigue; Hopelessness; Worthlessness; Sleep (too much or little); Increase/decrease in appetite; Tearfulness   Duration of Depressive symptoms: Duration of Depressive Symptoms: Greater than two weeks   Mania:  N/A   Anxiety:   Worrying; Tension   Psychosis:  None   Duration of Psychotic symptoms:    Trauma:  N/A   Obsessions:  N/A   Compulsions:  N/A   Inattention:  N/A   Hyperactivity/Impulsivity:  N/A   Oppositional/Defiant Behaviors:  N/A   Emotional Irregularity:  Recurrent suicidal behaviors/gestures/threats; Potentially harmful impulsivity; Chronic feelings of emptiness   Other Mood/Personality Symptoms:  n/a    Mental Status Exam Appearance and self-care  Stature:  Average   Weight:  Overweight   Clothing:  -- (scrubs)   Grooming:  Normal   Cosmetic use:  None   Posture/gait:  Other (Comment) (lying down in hospital bed)   Motor activity:  Not Remarkable   Sensorium  Attention:  Normal   Concentration:  Normal   Orientation:  X5   Recall/memory:  Normal   Affect and Mood  Affect:  Depressed   Mood:  Depressed   Relating  Eye contact:  Normal   Facial expression:  Depressed; Sad   Attitude toward examiner:  Cooperative   Thought and Language  Speech flow: Clear and Coherent   Thought content:  Appropriate to Mood and Circumstances    Preoccupation:  None   Hallucinations:  None   Organization:  Goal-directed   Company secretary of Knowledge:  Average   Intelligence:  Average   Abstraction:  Normal   Judgement:  Poor   Reality Testing:  Adequate   Insight:  Fair   Decision Making:  Impulsive   Social Functioning  Social Maturity:  Responsible   Social Judgement:  Normal   Stress  Stressors:  Other (Comment) (his wife's health)   Coping Ability:  Overwhelmed   Skill Deficits:  Self-care; Self-control; Decision making   Supports:  Family; Friends/Service system     Religion: Religion/Spirituality Are You A Religious Person?: Yes What is Your Religious Affiliation?: Christian How Might This Affect Treatment?:  n/a  Leisure/Recreation: Leisure / Recreation Do You Have Hobbies?: Yes Leisure and Hobbies: doing yard work and household projects  Exercise/Diet: Exercise/Diet Do You Exercise?: No Have You Gained or Lost A Significant Amount of Weight in the Past Six Months?: No Do You Follow a Special Diet?: No Do You Have Any Trouble Sleeping?: No   CCA Employment/Education Employment/Work Situation: Employment / Work Situation Employment Situation: Employed Work Stressors: n/a Patient's Job has Been Impacted by Current Illness: Yes Describe how Patient's Job has Been Impacted: Loss of motivation, missed work today. Has Patient ever Been in the Military?: No  Education: Education Is Patient Currently Attending School?: No Last Grade Completed: 12 Did You Attend College?:  (UTA) Did You Have An Individualized Education Program (IIEP):  (UTA) Did You Have Any Difficulty At School?:  (UTA) Patient's Education Has Been Impacted by Current Illness:  (UTA)   CCA Family/Childhood History Family and Relationship History: Family history Marital status: Married Number of Years Married: 38 What types of issues is patient dealing with in the relationship?: n/a Additional  relationship information: n/a Does patient have children?: No  Childhood History:  Childhood History By whom was/is the patient raised?: Both parents Did patient suffer any verbal/emotional/physical/sexual abuse as a child?: No Did patient suffer from severe childhood neglect?: No Has patient ever been sexually abused/assaulted/raped as an adolescent or adult?: No Was the patient ever a victim of a crime or a disaster?: No Witnessed domestic violence?: No Has patient been affected by domestic violence as an adult?: No       CCA Substance Use Alcohol/Drug Use: Alcohol / Drug Use Pain Medications: See MAR Prescriptions: See MAR Over the Counter: See MAR History of alcohol / drug use?: No history of alcohol / drug abuse                         ASAM's:  Six Dimensions of Multidimensional Assessment  Dimension 1:  Acute Intoxication and/or Withdrawal Potential:      Dimension 2:  Biomedical Conditions and Complications:      Dimension 3:  Emotional, Behavioral, or Cognitive Conditions and Complications:     Dimension 4:  Readiness to Change:     Dimension 5:  Relapse, Continued use, or Continued Problem Potential:     Dimension 6:  Recovery/Living Environment:     ASAM Severity Score:    ASAM Recommended Level of Treatment:     Substance use Disorder (SUD)    Recommendations for Services/Supports/Treatments:    Disposition Recommendation per psychiatric provider: We recommend inpatient psychiatric hospitalization when medically cleared. Patient is under voluntary admission status at this time; please IVC if attempts to leave hospital.   DSM5 Diagnoses: Patient Active Problem List   Diagnosis Date Noted   Unilateral primary osteoarthritis, left Alexander 11/18/2020   GAD (generalized anxiety disorder) 02/16/2018   Insomnia 02/16/2018   RUQ pain 09/02/2016   OSA on CPAP 11/23/2014   Depression 01/17/2014   Evaluate for Obstructive sleep apnea 01/17/2014    Hypersomnolence 01/17/2014   Anxiety 01/17/2014     Referrals to Alternative Service(s): Referred to Alternative Service(s):   Place:   Date:   Time:    Referred to Alternative Service(s):   Place:   Date:   Time:    Referred to Alternative Service(s):   Place:   Date:   Time:    Referred to Alternative Service(s):   Place:   Date:   Time:  Kyleah Pensabene C Maximo Spratling, LCMHCA

## 2023-06-14 NOTE — ED Triage Notes (Signed)
 Pt BIB friend after not showing up to work and going to a park with a firearm with the intention to end his life. Pt states that he has been having suicidal thoughts for a few weeks. Pt calm and cooperative. Pt states, "it's worse in the AM. I don't trust myself".

## 2023-06-15 ENCOUNTER — Encounter (HOSPITAL_COMMUNITY): Payer: Self-pay | Admitting: Psychiatry

## 2023-06-15 ENCOUNTER — Inpatient Hospital Stay (HOSPITAL_COMMUNITY)
Admission: AD | Admit: 2023-06-15 | Discharge: 2023-06-27 | DRG: 885 | Disposition: A | Discharge status: Home / Self Care | Source: Intra-hospital

## 2023-06-15 DIAGNOSIS — E559 Vitamin D deficiency, unspecified: Secondary | ICD-10-CM | POA: Diagnosis present

## 2023-06-15 DIAGNOSIS — F332 Major depressive disorder, recurrent severe without psychotic features: Secondary | ICD-10-CM | POA: Diagnosis present

## 2023-06-15 DIAGNOSIS — X749XXA Intentional self-harm by unspecified firearm discharge, initial encounter: Secondary | ICD-10-CM | POA: Diagnosis not present

## 2023-06-15 DIAGNOSIS — F5104 Psychophysiologic insomnia: Secondary | ICD-10-CM | POA: Diagnosis present

## 2023-06-15 DIAGNOSIS — T43216A Underdosing of selective serotonin and norepinephrine reuptake inhibitors, initial encounter: Secondary | ICD-10-CM | POA: Diagnosis present

## 2023-06-15 DIAGNOSIS — F411 Generalized anxiety disorder: Secondary | ICD-10-CM | POA: Diagnosis present

## 2023-06-15 DIAGNOSIS — Z91128 Patient's intentional underdosing of medication regimen for other reason: Secondary | ICD-10-CM

## 2023-06-15 DIAGNOSIS — Z79899 Other long term (current) drug therapy: Secondary | ICD-10-CM

## 2023-06-15 DIAGNOSIS — R45851 Suicidal ideations: Principal | ICD-10-CM

## 2023-06-15 MED ORDER — TRAZODONE HCL 50 MG PO TABS
50.0000 mg | ORAL_TABLET | Freq: Every evening | ORAL | Status: DC | PRN
  Administered 2023-06-15 – 2023-06-16 (×2): 50 mg via ORAL
  Filled 2023-06-15 (×2): qty 1

## 2023-06-15 MED ORDER — OLANZAPINE 5 MG PO TBDP: 5.0000 mg | ORAL_TABLET | Freq: Three times a day (TID) | ORAL | Status: DC | PRN

## 2023-06-15 MED ORDER — HYDROXYZINE HCL 25 MG PO TABS
25.0000 mg | ORAL_TABLET | Freq: Three times a day (TID) | ORAL | Status: DC | PRN
  Administered 2023-06-15 – 2023-06-18 (×5): 25 mg via ORAL
  Filled 2023-06-15 (×5): qty 1

## 2023-06-15 MED ORDER — OLANZAPINE 10 MG IM SOLR: 5.0000 mg | Freq: Three times a day (TID) | INTRAMUSCULAR | Status: DC | PRN

## 2023-06-15 MED ORDER — OLANZAPINE 10 MG IM SOLR: 10.0000 mg | Freq: Three times a day (TID) | INTRAMUSCULAR | Status: DC | PRN

## 2023-06-15 MED ORDER — MAGNESIUM HYDROXIDE 400 MG/5ML PO SUSP: 30.0000 mL | Freq: Every day | ORAL | Status: DC | PRN

## 2023-06-15 NOTE — ED Notes (Signed)
 Pt wanded by security. Belongings inventoried and placed in a locker. Valuables folder secured by RN and pt witnessed. Folder given to security.

## 2023-06-15 NOTE — ED Notes (Signed)
 Pt to desk to make telephone call.  Pt made aware of acceptance to [facility].

## 2023-06-15 NOTE — Progress Notes (Signed)
 Pt has been accepted to [facility] on 06/15/2023 . Bed assignment:302-2.   Pt meets inpatient criteria per 302-2.   Attending Physician will be Dr. Zouev  Report can be called to: Adult unit: [phone]  Pt can arrive pending discharges   Care Team Notified: [facility] Kathryn Parish, RN, Roise Cleaver, NP, Alethia Huxley, RN

## 2023-06-15 NOTE — ED Notes (Signed)
 2nd attempt at report to [facility].  No answer.

## 2023-06-15 NOTE — ED Notes (Signed)
 Waiting on [employer] to serve findings and custody.

## 2023-06-15 NOTE — ED Notes (Signed)
 IVC'd 06/15/23, exp 06/22/23; docs faxed, copies, e-filed, filed, etc. IVC docs taken to [facility].

## 2023-06-15 NOTE — ED Notes (Signed)
 The IVC was filed and returned by [employer], doc who signed the paperwork left it with 3rd shift doc so just waiting for the paperwork to be redone.

## 2023-06-15 NOTE — ED Notes (Deleted)
 IVC expires 06/22/2023. Copies given to purple RN.

## 2023-06-15 NOTE — Progress Notes (Signed)
 Patient is a 63 year old caucasian male admitted from [facility] as Involuntary. Pt admitted for suicidal ideation with plan to shoot himself with a shotgun.  Pt experiencing increased depression symptoms for the past few weeks.  Pt reported that got dressed for work and took his shotgun with him.  He reported he parked in what was a secluded area and had simulated shooting himself without the shell/bullet inserted.  He stated he was interrupted by a work truck and kept driving to find another place to kill himself.  Dx: Depression  He reports his co-worker his location on his phone and found him. He stated if his friend did not show up when he did he would be dead. He reports ongoing stress due to feeling inadequate and that he does not do enough to help his wife.  He reports feeling selfish and guilty about depression because of th burden on his wife.  He reports this causes an emotional cycle that results in suicidal ideation.   He denies any past suicide attempts but reports he does have a history of inpatient admissions. He denied substance abuse, HI/AVH. He is unable to contract for safety.    Patient reports anhedonia, hopelessness, guilt, loss of interest to do things they enjoy, fatigue, lack of concentration, worthlessness, and decreased appetite.  Patient denies history of abuse or trauma.   Patient denies access to weapons per [facility] report.   Pt was calm and cooperative throughout admission process and became tearful.    Admission and skin assessment completed, no bruising noted.  Pt has (2) 1/2 inch abrasions on right hand--no discharge noted.   Pt reports home medications and is not compliant with Effexor .  Outpatient provider:  Erin Havers at [facility]. Patient reported on admission he lied to his doctor about taking his Effexor  and has not been taking it.  Patient belongings listed and secured. Patient stable at this time. Patient signed paperwork and was given the opportunity to express  concerns and ask questions. Patient given toiletries and a meal was provided. Patient oriented to unit, introduce to staff, and settled into room. Q15 minutes safety checks in place and pt verbalized understanding.

## 2023-06-15 NOTE — ED Notes (Signed)
 Pt valuables folder collected by security to store.

## 2023-06-15 NOTE — ED Notes (Signed)
 Safe transport called says [facility] 

## 2023-06-15 NOTE — ED Notes (Signed)
 Phone call from sheriff dept that pt needs transported via [place] [employer]; ED secretary updated

## 2023-06-15 NOTE — B[facility] Group Notes (Addendum)
 [facility] Group Notes:  (Nursing/MHT/Case Management/Adjunct)  Date:  06/15/2023  Time:  2020  Type of Therapy:  Wrap up group  Participation Level:  Active  Participation Quality:  Appropriate, Attentive, Sharing, and Supportive  Affect:  Depressed and Flat  Cognitive:  Alert  Insight:  Limited  Engagement in Group:  Engaged  Modes of Intervention:  Clarification, Education, and Support  Summary of Progress/Problems: Positive thinking and self-care were discussed.   Catharine Clock 06/15/2023, 9:20 PM

## 2023-06-15 NOTE — ED Notes (Signed)
 Attempted report to [facility].  Receiving RN unable to take report at this time.  Request that this RN call back in 30 mins.

## 2023-06-15 NOTE — Plan of Care (Signed)
   Problem: Education: Goal: Knowledge of [place] [facility] Education information/materials will improve Outcome: Progressing   Problem: Safety: Goal: Periods of time without injury will increase Outcome: Progressing

## 2023-06-15 NOTE — ED Provider Notes (Signed)
 I assumed care of the patient at 0 800.  Briefly patient is here for psychiatric evaluation.  He was IVC.  Has a bed at behavioral health.  I feel he is stable for transport.   Albertus Hughs, DO 06/15/23 1552

## 2023-06-16 ENCOUNTER — Encounter (HOSPITAL_COMMUNITY): Payer: Self-pay

## 2023-06-16 ENCOUNTER — Encounter (HOSPITAL_COMMUNITY): Payer: Self-pay | Admitting: Psychiatry

## 2023-06-16 DIAGNOSIS — F332 Major depressive disorder, recurrent severe without psychotic features: Secondary | ICD-10-CM | POA: Diagnosis not present

## 2023-06-16 DIAGNOSIS — Z8659 Personal history of other mental and behavioral disorders: Secondary | ICD-10-CM

## 2023-06-16 DIAGNOSIS — X749XXA Intentional self-harm by unspecified firearm discharge, initial encounter: Secondary | ICD-10-CM

## 2023-06-16 DIAGNOSIS — F411 Generalized anxiety disorder: Secondary | ICD-10-CM

## 2023-06-16 DIAGNOSIS — F5104 Psychophysiologic insomnia: Secondary | ICD-10-CM

## 2023-06-16 HISTORY — DX: Psychophysiologic insomnia: F51.04

## 2023-06-16 HISTORY — DX: Personal history of other mental and behavioral disorders: Z86.59

## 2023-06-16 HISTORY — DX: Generalized anxiety disorder: F41.1

## 2023-06-16 LAB — HIV ANTIBODY (ROUTINE TESTING W REFLEX): HIV Screen 4th Generation wRfx: NONREACTIVE

## 2023-06-16 LAB — VITAMIN D 25 HYDROXY (VIT D DEFICIENCY, FRACTURES): Vit D, 25-Hydroxy: 28.91 ng/mL — ABNORMAL LOW (ref 30–100)

## 2023-06-16 LAB — FOLATE: Folate: 14.5 ng/mL (ref >5.9)

## 2023-06-16 LAB — TSH: TSH: 3.412 u[IU]/mL (ref 0.350–4.500)

## 2023-06-16 LAB — VITAMIN B12: Vitamin B-12: 393 pg/mL (ref 180–914)

## 2023-06-16 MED ORDER — MIRTAZAPINE 15 MG PO TABS
15.0000 mg | ORAL_TABLET | Freq: Every day | ORAL | Status: DC
  Administered 2023-06-16 – 2023-06-20 (×5): 15 mg via ORAL
  Filled 2023-06-16 (×5): qty 1

## 2023-06-16 MED ORDER — CLONAZEPAM 0.5 MG PO TABS
0.5000 mg | ORAL_TABLET | Freq: Every day | ORAL | Status: DC
  Administered 2023-06-16 – 2023-06-23 (×6): 0.5 mg via ORAL
  Filled 2023-06-16 (×9): qty 1

## 2023-06-16 NOTE — Group Note (Signed)
 Recreation Therapy Group Note   Group Topic:Team Building  Group Date: 06/16/2023 Start Time: 0945 End Time: 1005 Facilitators: Kniyah Khun-McCall, LRT,CTRS Location: [address] Dayroom   Group Topic: Communication, Team Building, Problem Solving  Goal Area(s) Addresses:  Patient will effectively work with peer towards shared goal.  Patient will identify skills used to make activity successful.  Patient will identify how skills used during activity can be used to reach post d/c goals.   Behavioral Response:   Intervention: STEM Activity  Activity: Landing Pad. In teams of 3-5, patients were given 12 plastic drinking straws and an equal length of masking tape. Using the materials provided, patients were asked to build a landing pad to catch a golf ball dropped from approximately 5 feet in the air. All materials were required to be used by the team in their design. LRT facilitated post-activity discussion.  Education: Pharmacist, community, Scientist, physiological, Discharge Planning   Education Outcome: Acknowledges education/In group clarification offered/Needs additional education.    Affect/Mood: N/A   Participation Level: Did not attend    Clinical Observations/Individualized Feedback:     Plan: Continue to engage patient in RT group sessions 2-3x/week.   Kylo Gavin-McCall, LRT,CTRS 06/16/2023 1:15 PM

## 2023-06-16 NOTE — Progress Notes (Signed)
   06/16/23 2300  Psych Admission Type (Psych Patients Only)  Admission Status Involuntary  Psychosocial Assessment  Patient Complaints Anxiety;Depression  Eye Contact Fair  Facial Expression Anxious  Affect Depressed;Sad  Speech Logical/coherent  Interaction Assertive  Motor Activity Slow  Appearance/Hygiene Unremarkable  Behavior Characteristics Cooperative;Calm  Mood Depressed  Thought Process  Coherency WDL  Content WDL  Delusions None reported or observed  Perception WDL  Hallucination None reported or observed  Judgment Impaired  Confusion None  Danger to Self  Current suicidal ideation? Denies  Description of Suicide Plan None  Self-Injurious Behavior No self-injurious ideation or behavior indicators observed or expressed   Agreement Not to Harm Self Yes  Description of Agreement Verbal contract for safety  Danger to Others  Danger to Others None reported or observed

## 2023-06-16 NOTE — BH IP Treatment Plan (Signed)
 Interdisciplinary Treatment and Diagnostic Plan Update  06/16/2023 Time of Session: 10:30 AM Sean Alexander MRN: [id number]  Principal Diagnosis: Major depressive disorder, recurrent severe without psychotic features (HCC)  Secondary Diagnoses: Principal Problem:   Major depressive disorder, recurrent severe without psychotic features (HCC) Active Problems:   Generalized anxiety disorder   Chronic insomnia   Suicidal ideation   Suicide attempt by firearm [facility])   Current Medications:  Current Facility-Administered Medications  Medication Dose Route Frequency Provider Last Rate Last Admin   clonazePAM (KLONOPIN) tablet 0.5 mg  0.5 mg Oral QHS Parker, Alvin S, MD       hydrOXYzine (ATARAX) tablet 25 mg  25 mg Oral TID PRN Onuoha, Chinwendu V, NP   25 mg at 06/15/23 2250   magnesium hydroxide (MILK OF MAGNESIA) suspension 30 mL  30 mL Oral Daily PRN Dorthea Gauze, NP       mirtazapine (REMERON) tablet 15 mg  15 mg Oral QHS Parker, Alvin S, MD       OLANZapine (ZYPREXA) injection 10 mg  10 mg Intramuscular TID PRN Dorthea Gauze, NP       OLANZapine (ZYPREXA) injection 5 mg  5 mg Intramuscular TID PRN Dorthea Gauze, NP       OLANZapine zydis (ZYPREXA) disintegrating tablet 5 mg  5 mg Oral TID PRN Dorthea Gauze, NP       traZODone (DESYREL) tablet 50 mg  50 mg Oral QHS PRN Onuoha, Chinwendu V, NP   50 mg at 06/15/23 2250   PTA Medications: Medications Prior to Admission  Medication Sig Dispense Refill Last Dose/Taking   ALPRAZolam  (XANAX ) 0.5 MG tablet TAKE ONE TABLET BY MOUTH TWICE A DAY AS NEEDED FOR ANXIETY (Patient taking differently: Take 0.5 mg by mouth at bedtime as needed for anxiety. TAKE ONE TABLET BY MOUTH TWICE A DAY AS NEEDED FOR ANXIETY) 60 tablet 5    Ascorbic Acid 100 MG CHEW Chew 500 mg by mouth daily.      metoprolol  succinate (TOPROL  XL) 25 MG 24 hr tablet Take 1 tablet (25 mg total) by mouth daily. 90 tablet 3    Multiple Vitamins-Minerals (CENTRUM ADULTS) TABS Take 1  tablet by mouth daily.      venlafaxine  XR (EFFEXOR -XR) 150 MG 24 hr capsule Take 1 capsule (150 mg total) by mouth daily with breakfast. (Patient not taking: Reported on 06/14/2023) 90 capsule 1     Patient Stressors:    Patient Strengths:    Treatment Modalities: Medication Management, Group therapy, Case management,  1 to 1 session with clinician, Psychoeducation, Recreational therapy.   Physician Treatment Plan for Primary Diagnosis: Major depressive disorder, recurrent severe without psychotic features (HCC) Long Term Goal(s):     Short Term Goals:    Medication Management: Evaluate patient's response, side effects, and tolerance of medication regimen.  Therapeutic Interventions: 1 to 1 sessions, Unit Group sessions and Medication administration.  Evaluation of Outcomes: Not Progressing  Physician Treatment Plan for Secondary Diagnosis: Principal Problem:   Major depressive disorder, recurrent severe without psychotic features (HCC) Active Problems:   Generalized anxiety disorder   Chronic insomnia   Suicidal ideation   Suicide attempt by firearm [facility])  Long Term Goal(s):     Short Term Goals:       Medication Management: Evaluate patient's response, side effects, and tolerance of medication regimen.  Therapeutic Interventions: 1 to 1 sessions, Unit Group sessions and Medication administration.  Evaluation of Outcomes: Not Progressing   RN Treatment Plan for Primary  Diagnosis: Major depressive disorder, recurrent severe without psychotic features (HCC) Long Term Goal(s): Knowledge of disease and therapeutic regimen to maintain health will improve  Short Term Goals: Ability to remain free from injury will improve, Ability to verbalize frustration and anger appropriately will improve, Ability to demonstrate self-control, Ability to participate in decision making will improve, Ability to verbalize feelings will improve, Ability to disclose and discuss suicidal ideas,  Ability to identify and develop effective coping behaviors will improve, and Compliance with prescribed medications will improve  Medication Management: RN will administer medications as ordered by provider, will assess and evaluate patient's response and provide education to patient for prescribed medication. RN will report any adverse and/or side effects to prescribing provider.  Therapeutic Interventions: 1 on 1 counseling sessions, Psychoeducation, Medication administration, Evaluate responses to treatment, Monitor vital signs and CBGs as ordered, Perform/monitor CIWA, COWS, AIMS and Fall Risk screenings as ordered, Perform wound care treatments as ordered.  Evaluation of Outcomes: Not Progressing   LCSW Treatment Plan for Primary Diagnosis: Major depressive disorder, recurrent severe without psychotic features (HCC) Long Term Goal(s): Safe transition to appropriate next level of care at discharge, Engage patient in therapeutic group addressing interpersonal concerns.  Short Term Goals: Engage patient in aftercare planning with referrals and resources, Increase social support, Increase ability to appropriately verbalize feelings, Increase emotional regulation, Facilitate acceptance of mental health diagnosis and concerns, Facilitate patient progression through stages of change regarding substance use diagnoses and concerns, Identify triggers associated with mental health/substance abuse issues, and Increase skills for wellness and recovery  Therapeutic Interventions: Assess for all discharge needs, 1 to 1 time with Social worker, Explore available resources and support systems, Assess for adequacy in community support network, Educate family and significant other(s) on suicide prevention, Complete Psychosocial Assessment, Interpersonal group therapy.  Evaluation of Outcomes: Not Progressing   Progress in Treatment: Attending groups: Yes. Participating in groups: Yes. Taking medication as  prescribed: patient has not been prescribed any mediations yet Toleration medication: N/A Family/Significant other contact made: consents are pending Patient understands diagnosis: Yes. Discussing patient identified problems/goals with staff: Yes. Medical problems stabilized or resolved: Yes. Denies suicidal/homicidal ideation: Yes. Issues/concerns per patient self-inventory: No.  New problem(s) identified:  No  New Short Term/Long Term Goal(s):    medication stabilization, elimination of SI thoughts, development of comprehensive mental wellness plan.   Patient Goals:  I want to get my life on track and become a better person for my wife.     Discharge Plan or Barriers:  Patient recently admitted. CSW will continue to follow and assess for appropriate referrals and possible discharge planning.   Reason for Continuation of Hospitalization: Anxiety Depression Medication stabilization Suicidal ideation  Estimated Length of Stay:  5 - 7 days  Last 3 [place] Suicide Severity Risk Score: Flowsheet Row [facility] (Current[facility]14/2023 in [facility] Department at [facility] ED from 09/26/2022 in [facility] Emergency Department at [facility]  C-SSRS RISK CATEGORY High Risk High Risk No Risk       Last [facility] 2/9 Scores:     No data to display          Scribe for Treatment Team: Coraleigh Sheeran O Tyquisha Sharps, LCSWA 06/16/2023 6:21 PM

## 2023-06-16 NOTE — Progress Notes (Signed)
   06/16/23 1000  Psych Admission Type (Psych Patients Only)  Admission Status Involuntary  Psychosocial Assessment  Patient Complaints Anxiety;Depression  Eye Contact Fair  Facial Expression Anxious;Sad  Affect Depressed;Sad  Speech Logical/coherent  Interaction Assertive  Motor Activity Slow  Appearance/Hygiene Unremarkable  Behavior Characteristics Cooperative;Calm  Mood Depressed  Thought Process  Coherency WDL  Content WDL  Delusions None reported or observed  Perception WDL  Hallucination None reported or observed  Judgment Impaired  Confusion None  Danger to Self  Current suicidal ideation? Denies  Agreement Not to Harm Self Yes  Description of Agreement verbal  Danger to Others  Danger to Others None reported or observed

## 2023-06-16 NOTE — Progress Notes (Signed)
   06/16/23 0200  Psych Admission Type (Psych Patients Only)  Admission Status Involuntary  Psychosocial Assessment  Patient Complaints Anxiety;Depression  Eye Contact Fair  Facial Expression Anxious  Affect Depressed;Sad  Speech Logical/coherent  Interaction Assertive  Motor Activity Slow  Appearance/Hygiene Unremarkable  Behavior Characteristics Calm;Cooperative  Mood Depressed  Thought Process  Coherency WDL  Content WDL  Delusions None reported or observed  Perception WDL  Hallucination None reported or observed  Judgment Impaired  Confusion None  Danger to Self  Current suicidal ideation? Denies  Self-Injurious Behavior No self-injurious ideation or behavior indicators observed or expressed   Agreement Not to Harm Self Yes  Description of Agreement Verbal contract for safety  Danger to Others  Danger to Others None reported or observed

## 2023-06-16 NOTE — BHH Group Notes (Signed)
 BHH Group Notes:  (Nursing/MHT/Case Management/Adjunct)  Date:  06/16/2023  Time:  8:21 PM  Type of Therapy:  NA Group  Participation Level:  Active  Participation Quality:  Appropriate  Affect:  Appropriate  Cognitive:  Appropriate  Insight:  Appropriate  Engagement in Group:  Engaged  Modes of Intervention:  Education  Summary of Progress/Problems: Attended NA meeting.  Sean Alexander 06/16/2023, 8:21 PM

## 2023-06-16 NOTE — Plan of Care (Signed)
   Problem: Education: Goal: Knowledge of [place] [facility] Education information/materials will improve Outcome: Progressing Goal: Emotional status will improve Outcome: Progressing Goal: Mental status will improve Outcome: Progressing Goal: Verbalization of understanding the information provided will improve Outcome: Progressing

## 2023-06-16 NOTE — BHH Group Notes (Signed)
 Spirituality Group   Focus of discussion: Gratitude and Strength Awareness   Process: Following theoretical framework of group therapy of Irvin Yalom and further informed by Rogerian and Relational Cultural Theory approaches, participants invited to name:   Sources of gratitude (internal>external)   Articulate gratitude for self   Name a personal strength/gift/skill   Locate points of resonance among group members/engage the here and now   Conclude with grounding/breathwork       Observations: Sean Alexander was fairly quiet but still engaged in the group discussion.  Addy Mcmannis L. Minetta Aly, M.Div [phone]

## 2023-06-16 NOTE — Plan of Care (Signed)
  Problem: Education: Goal: Emotional status will improve Outcome: Progressing Goal: Verbalization of understanding the information provided will improve Outcome: Progressing   Problem: Coping: Goal: Ability to verbalize frustrations and anger appropriately will improve Outcome: Progressing   Problem: Health Behavior/Discharge Planning: Goal: Identification of resources available to assist in meeting health care needs will improve Outcome: Progressing   Problem: Physical Regulation: Goal: Ability to maintain clinical measurements within normal limits will improve Outcome: Progressing

## 2023-06-16 NOTE — Plan of Care (Signed)
   Problem: Education: Goal: Knowledge of [place] [facility] Education information/materials will improve Outcome: Progressing Goal: Emotional status will improve Outcome: Progressing Goal: Mental status will improve Outcome: Progressing

## 2023-06-16 NOTE — BHH Suicide Risk Assessment (Signed)
 [facility] Admission Suicide Risk Assessment   Nursing information obtained from:  Patient Demographic factors:  Male, Caucasian, Access to firearms Current Mental Status:  Suicidal ideation indicated by patient, Suicidal ideation indicated by others, Suicide plan Loss Factors:  NA Historical Factors:  NA Risk Reduction Factors:  Living with another person, especially a relative, Employed, Positive coping skills or problem solving skills  Total Time spent with patient: 1 hour Principal Problem: Suicidal ideation Diagnosis:  Principal Problem:   Suicidal ideation Active Problems:   Chronic insomnia   Major depressive disorder, recurrent severe without psychotic features (HCC)   Suicide attempt by firearm [facility])  Subjective Data: Sean Alexander is a 63 y.o. male who has a past medical history of Chronic insomnia (06/16/2023), GAD (generalized anxiety disorder) (06/16/2023), H/O psychiatric hospitalization (06/16/2023), Hepatitis A virus infection, Kidney Boran (2017), Major depressive disorder, recurrent severe without psychotic features (HCC) (06/16/2023), Sleep apnea, and Suicide attempt by firearm [facility]) (06/16/2023). He presented for Suicidal ideation [R45.851].  He had an aborted suicide attempt by shotgun.  He plan the ACT carefully, but his work located him by his work phone before he could pull the trigger.   Continued Clinical Symptoms:  Alcohol Use Disorder Identification Test Final Score (AUDIT): 0 The Alcohol Use Disorders Identification Test, Guidelines for Use in Primary Care, Second Edition.  World Science writer [facility]). Score between 0-7:  no or low risk or alcohol related problems. Score between 8-15:  moderate risk of alcohol related problems. Score between 16-19:  high risk of alcohol related problems. Score 20 or above:  warrants further diagnostic evaluation for alcohol dependence and treatment.   CLINICAL FACTORS:   Severe Anxiety and/or Agitation Depression:    Anhedonia Hopelessness Insomnia Severe Dysthymia More than one psychiatric diagnosis Unstable or Poor Therapeutic Relationship Previous Psychiatric Diagnoses and Treatments   Musculoskeletal: Strength & Muscle Tone: within normal limits Gait & Station: normal Patient leans: N/A  Psychiatric Specialty Exam:  Presentation  General Appearance:  Disheveled  Eye Contact: Fair  Speech: Clear and Coherent  Speech Volume: Normal  Handedness: Right   Mood and Affect  Mood: Dysphoric; Anxious; Hopeless; Worthless  Affect: Restricted; Congruent   Thought Process  Thought Processes: Linear  Descriptions of Associations:Intact  Orientation:Partial  Thought Content:Logical  History of Schizophrenia/Schizoaffective disorder:No  Duration of Psychotic Symptoms:No data recorded Hallucinations:Hallucinations: None  Ideas of Reference:None  Suicidal Thoughts:Suicidal Thoughts: Yes, Active SI Active Intent and/or Plan: Without Plan  Homicidal Thoughts:Homicidal Thoughts: No   Sensorium  Memory: Immediate Good; Recent Good  Judgment: Fair  Insight: Fair   Executive Functions  Concentration: Good  Attention Span: Good  Recall: Good  Fund of Knowledge: Good  Language: Good   Psychomotor Activity  Psychomotor Activity: Psychomotor Activity: Psychomotor Retardation   Assets  Assets: Communication Skills; Resilience   Sleep  Sleep: Sleep: Poor    Physical Exam: Physical Exam ROS Blood pressure 128/83, pulse 68, temperature 97.9 F (36.6 C), temperature source Oral, resp. rate 18, height 5' 10 (1.778 m), weight 99.3 kg, SpO2 96%. Body mass index is 31.42 kg/m.   COGNITIVE FEATURES THAT CONTRIBUTE TO RISK:  None    SUICIDE RISK:   Extreme:  Frequent, intense, and enduring suicidal ideation, specific plans, clear subjective and objective intent, impaired self-control, severe dysphoria/symptomatology, many risk factors and no  protective factors.  PLAN OF CARE: See history and physical  I certify that inpatient services furnished can reasonably be expected to improve the patient's condition.   Hydeia Mcatee  Orinda Birkenhead, MD 06/16/2023, 12:53 PM

## 2023-06-16 NOTE — Progress Notes (Signed)
   06/16/23 1541  Spiritual Encounters  Type of Visit Initial  Care provided to: Patient  Spiritual Framework  Presenting Themes Meaning/purpose/sources of inspiration   I met with [name] following spirituality group.  [name] debriefed with me about his mother-in-law's stroke on Sunday and his subsequent SA on Monday. He expressed finding meaning in [name] faith.  I provided compassionate presence. I used active and reflective listening, especially noting that Sean Alexander is self-critical. I engaged his faith outlook to reflect on this and to invite some self-compassion. I facilitated meaning making and prayed with Sean Alexander with his consent.  Will refer for ongoing support.. [name] L. Minetta Aly, M.Div [phone]
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
## 2023-06-17 DIAGNOSIS — F332 Major depressive disorder, recurrent severe without psychotic features: Secondary | ICD-10-CM | POA: Diagnosis not present

## 2023-06-17 LAB — HEMOGLOBIN A1C
Hgb A1c MFr Bld: 5.8 % — ABNORMAL HIGH (ref 4.8–5.6)
Mean Plasma Glucose: 120 mg/dL

## 2023-06-17 LAB — RPR: RPR Ser Ql: NONREACTIVE

## 2023-06-17 MED ORDER — VITAMIN D 25 MCG (1000 UNIT) PO TABS
2000.0000 [IU] | ORAL_TABLET | Freq: Every day | ORAL | Status: DC
  Administered 2023-06-17 – 2023-06-27 (×10): 2000 [IU] via ORAL
  Filled 2023-06-17 (×12): qty 2

## 2023-06-17 NOTE — [facility] Counselor (Signed)
 Adult Comprehensive Assessment  Patient ID: Sean Alexander, male   DOB: 1960/08/26, 63 y.o.   MRN: [id number]  Information Source: Information source: Patient  Current Stressors:  Patient states their primary concerns and needs for treatment are:: I had been feeling hopeless, stressed, and depressed. I tried to end my life Patient states their goals for this hospitilization and ongoing recovery are:: realizing I need to deal with things when they are small before they pile up Educational / Learning stressors: None reported Employment / Job issues: None reported Family Relationships: Patient reported concerns regarding his wife's medical issues with her heart which has caused him to feel even more depressed knowing that's something she's having to struggle with. Financial / Lack of resources (include bankruptcy): None reported Housing / Lack of housing: None reported Physical health (include injuries & life threatening diseases): None reported Social relationships: None reported Substance abuse: None reported Bereavement / Loss: None reported  Living/Environment/Situation:  Living Arrangements: Spouse/significant other Living conditions (as described by patient or guardian): with my wife Who else lives in the home?: just me and my wife How long has patient lived in current situation?: 40 years What is atmosphere in current home: Loving, Supportive  Family History:  Marital status: Married Number of Years Married: 38 What types of issues is patient dealing with in the relationship?: Patient reported difficulties with struggling with his wife's health issues. Additional relationship information: N/A Are you sexually active?: No What is your sexual orientation?: Heterosexual Has your sexual activity been affected by drugs, alcohol, medication, or emotional stress?: No Does patient have children?: No  Childhood History:  By whom was/is the patient raised?: Both  parents Additional childhood history information: None reported Description of patient's relationship with caregiver when they were a child: My mom was very depressed. We were kind of dysfunctional as a family because of her mental health disorder. We didn't talk much or have deep conversations. Patient's description of current relationship with people who raised him/her: Patient reports both parents are deceased. How were you disciplined when you got in trouble as a child/adolescent?: We got whoopings back then but not a lot Does patient have siblings?: Yes Number of Siblings: 5 Description of patient's current relationship with siblings: Patient reports 2 of his siblings are deceased but stated My older siblings that are alive we see each other every few weeks. We have a surface level relationship but aren't super close. Did patient suffer any verbal/emotional/physical/sexual abuse as a child?: No Did patient suffer from severe childhood neglect?: No Has patient ever been sexually abused/assaulted/raped as an adolescent or adult?: No Was the patient ever a victim of a crime or a disaster?: No Witnessed domestic violence?: No Has patient been affected by domestic violence as an adult?: No  Education:  Highest grade of school patient has completed: Associate's in Chiropodist Currently a student?: No Learning disability?: No  Employment/Work Situation:   Employment Situation: Employed Where is Patient Currently Employed?: [employer] Long has Patient Been Employed?: 2013 Are You Satisfied With Your Job?: Yes Do You Work More Than One Job?: No Work Stressors: None reported Patient's Job has Been Impacted by Current Illness: Yes Describe how Patient's Job has Been Impacted: some motivation What is the Longest Time Patient has Held a Job?: 22 years 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?: No  Financial  Resources:   Financial resources: Income from employment Does patient have  a representative payee or guardian?: No  Alcohol/Substance Abuse:   What has been your use of drugs/alcohol within the last 12 months?: None reported If attempted suicide, did drugs/alcohol play a role in this?: No Alcohol/Substance Abuse Treatment Hx: Denies past history If yes, describe treatment: N/A Has alcohol/substance abuse ever caused legal problems?: No  Social Support System:   Forensic psychologist System: Fair Museum/gallery exhibitions officer System: my wife, good co-workers/friends who are always there and have bene or 30 years, a few neighbors in the neighborhood Type of faith/religion: Lynder Sanger How does patient's faith help to cope with current illness?: I pray big time  Leisure/Recreation:   Leisure and Hobbies: doing yard work and household projects  Strengths/Needs:   What is the patient's perception of their strengths?: being friendly and helpful with others and working on things Patient states they can use these personal strengths during their treatment to contribute to their recovery: It can help me help myself and help others Patient states these barriers may affect/interfere with their treatment: None reported Patient states these barriers may affect their return to the community: None reported Other important information patient would like considered in planning for their treatment: N/A  Discharge Plan:   Currently receiving community mental health services: No ([facility] for med management - fridays only) Patient states concerns and preferences for aftercare planning are: I want to continue with my psychiatrist but maybe therapy would be good too. Patient states they will know when they are safe and ready for discharge when: I don't really know right now, but I'm sure I'll know soon Does patient have access to transportation?: Yes Does patient have financial  barriers related to discharge medications?: No Patient description of barriers related to discharge medications: None reported Will patient be returning to same living situation after discharge?: Yes  Summary/Recommendations:   Summary and Recommendations (to be completed by the evaluator): Coreen Devoid is a 63 year old male involuntarily admitted from [facility] at [facility] due to suicidal ideation with a plan to shoot himself to end his life. Patient reportedly drove to a park with a firearm with the intention of ending his life. Patient reported current stressors include his wife's health issues and the health issues of his extended family members. He reported a lack of proper coping skills which led him to wanting to end his life. Patient denied the use of illicit, mood-altering substances including the consumption of alcohol. Patient's urinary drug screen was positive for benzodiazepines. Patient reported currently engaging in outpatient mental health treatment including medication management at [facility]. Patient denied currently having a therapist but is open to the idea.While here, [place] can benefit from crisis stabilization, medication management, therapeutic milieu, and referrals for services.   Trula Frede M Makenzey Nanni, LCSWA 06/17/2023

## 2023-06-17 NOTE — [facility] Group Notes (Signed)
 [facility] Group Notes:  (Nursing/MHT/Case Management/Adjunct)  Date:  06/17/2023  Time:  9:00 PM  Type of Therapy:  Wrap-up group  Participation Level:  Active  Participation Quality:  Appropriate  Affect:  Appropriate  Cognitive:  Appropriate  Insight:  Appropriate  Engagement in Group:  Engaged  Modes of Intervention:  Education  Summary of Progress/Problems: Goal to forgive self. Rated day 7/10.  Sean Alexander 06/17/2023, 9:00 PM

## 2023-06-17 NOTE — Progress Notes (Signed)
 [name] Progress Note  06/17/2023 12:24 PM Sean Alexander  MRN:  [id number] Principal Problem: Major depressive disorder, recurrent severe without psychotic features (HCC) Diagnosis: Principal Problem:   Major depressive disorder, recurrent severe without psychotic features (HCC) Active Problems:   Generalized anxiety disorder   Chronic insomnia   Suicidal ideation   Suicide attempt by firearm [name])   ID & Admission Information: Sean Alexander is an 63 y.o. male who  has a past medical history of Anxiety (01/17/2014), Chronic insomnia (06/16/2023), Evaluate for Obstructive sleep apnea (01/17/2014), GAD (generalized anxiety disorder) (06/16/2023), H/O psychiatric hospitalization (06/16/2023), Hepatitis A virus infection, Hypersomnolence (01/17/2014), Kidney [place] (2017), Major depressive disorder, recurrent severe without psychotic features (HCC) (06/16/2023), OSA on CPAP (11/23/2014), RUQ pain (09/02/2016), Sleep apnea, Suicide attempt by firearm (HCC) (06/16/2023), and Unilateral primary osteoarthritis, left knee (11/18/2020).  He presented on 06/15/2023  4:00 PM for Major depressive disorder, recurrent severe without psychotic features (HCC). He presented under petition for involuntary commitment for an aborted suicide attempt by shotgun.   Subjective:   Case was discussed in the multidisciplinary team. MAR was reviewed and patient was compliant with medications.   PRN's in last 24 hours: Atarax 25 mg administered at 2116 and 1010 Trazodone 50 mg administered at 2116  Psychiatric Team made the following recommendations yesterday: Discontinue Xanax  0.5 mg nightly Start clonazepam 0.5 mg nightly Start Remeron 15 mg nightly  Patient reports that he got the best sleep last night that he had in 20 years.  He did report mild restless legs before falling asleep, but denies any problems this morning.  Will plan on discontinuing as needed trazodone, as it is likely source of the restless legs, I do not  think he needs clonazepam, trazodone, and Remeron for sleep.  His mood remains subjectively depressed and anxious; however, he is significantly brighter than yesterday.  He did mention yesterday that he is very good at disguising his depression from other people.  He reports his appetite is adequate.  He denies auditory hallucinations, visual hallucinations, or paranoia.  Other than the restless legs overnight he denied medication side effects.  We discussed his hypovitaminosis D and plan to replace with over-the-counter vitamin D.  Advised to continue this medication after discharge.  Will plan on continuing the current treatment plan minus the as needed trazodone.  He has had 2 doses of as needed Atarax which she finds mildly sedating, but helpful for anxiety.  Will continue that medication as needed.    Past Psychiatric and Medical Medical History:  Past Medical History:  Diagnosis Date   Anxiety 01/17/2014   Chronic insomnia 06/16/2023   Evaluate for Obstructive sleep apnea 01/17/2014   GAD (generalized anxiety disorder) 06/16/2023   H/O psychiatric hospitalization 06/16/2023   Hepatitis A virus infection    Hypersomnolence 01/17/2014   Kidney [place] 2017   Major depressive disorder, recurrent severe without psychotic features (HCC) 06/16/2023   OSA on CPAP 11/23/2014   RUQ pain 09/02/2016   Sleep apnea    uses CPAP   Suicide attempt by firearm (HCC) 06/16/2023   aborted   Unilateral primary osteoarthritis, left knee 11/18/2020    Past Surgical History:  Procedure Laterality Date   CHOLECYSTECTOMY N/A 09/02/2016   Procedure: LAPAROSCOPIC CHOLECYSTECTOMY WITH INTRAOPERATIVE CHOLANGIOGRAM;  Surgeon: Adalberto Hollow, MD;  Location: [facility];  Service: General;  Laterality: N/A;   HERNIA REPAIR  1986    Family History(Medical and Psychiatric):  Family History  Problem Relation Age of Onset  Heart disease Mother    Heart disease Father    Diabetes Brother    Colon cancer Neg Hx     Colon polyps Neg Hx    Extensive family history of depression and anxiety Per H&P    Social History:  Social History   Substance and Sexual Activity  Alcohol Use No   Alcohol/week: 0.0 standard drinks of alcohol     Social History   Substance and Sexual Activity  Drug Use No    Social History   Socioeconomic History   Marital status: Married    Spouse name: Rice Chamorro   Number of children: 0   Years of education: Not on file   Highest education level: Not on file  Occupational History   Occupation: Psychiatrist  Tobacco Use   Smoking status: Never   Smokeless tobacco: Never  Vaping Use   Vaping status: Never Used  Substance and Sexual Activity   Alcohol use: No    Alcohol/week: 0.0 standard drinks of alcohol   Drug use: No   Sexual activity: Not Currently  Other Topics Concern   Not on file  Social History Narrative   Not on file   Social Drivers of Health   Financial Resource Strain: Not on file  Food Insecurity: No Food Insecurity (06/15/2023)   Hunger Vital Sign    Worried About Running Out of Food in the Last Year: Never true    Ran Out of Food in the Last Year: Never true  Transportation Needs: No Transportation Needs (06/15/2023)   PRAPARE - Administrator, Civil Service (Medical): No    Lack of Transportation (Non-Medical): No  Physical Activity: Not on file  Stress: Not on file  Social Connections: Not on file        Current Medications: Current Facility-Administered Medications  Medication Dose Route Frequency Provider Last Rate Last Admin   cholecalciferol (VITAMIN D3) 25 MCG (1000 UNIT) tablet 2,000 Units  2,000 Units Oral Daily Lynnel Zanetti S, MD   2,000 Units at 06/17/23 1114   clonazePAM (KLONOPIN) tablet 0.5 mg  0.5 mg Oral QHS Molley Houser S, MD   0.5 mg at 06/16/23 2116   hydrOXYzine (ATARAX) tablet 25 mg  25 mg Oral TID PRN Onuoha, Chinwendu V, NP   25 mg at 06/17/23 1010   magnesium hydroxide (MILK OF  MAGNESIA) suspension 30 mL  30 mL Oral Daily PRN Dorthea Gauze, NP       mirtazapine (REMERON) tablet 15 mg  15 mg Oral QHS Rajni Holsworth S, MD   15 mg at 06/16/23 2116   OLANZapine (ZYPREXA) injection 10 mg  10 mg Intramuscular TID PRN Dorthea Gauze, NP       OLANZapine (ZYPREXA) injection 5 mg  5 mg Intramuscular TID PRN Dorthea Gauze, NP       OLANZapine zydis (ZYPREXA) disintegrating tablet 5 mg  5 mg Oral TID PRN Dorthea Gauze, NP       traZODone (DESYREL) tablet 50 mg  50 mg Oral QHS PRN Onuoha, Chinwendu V, NP   50 mg at 06/16/23 2116    Lab Results:  Results for orders placed or performed during the hospital encounter of 06/15/23 (from the past 48 hours)  Folate     Status: None   Collection Time: 06/16/23  6:25 PM  Result Value Ref Range   Folate 14.5 >5.9 ng/mL    Comment: Performed at [facility], [address] [place], [place]  [address]  Hemoglobin A1c     Status: Abnormal   Collection Time: 06/16/23  6:25 PM  Result Value Ref Range   Hgb A1c MFr Bld 5.8 (H) 4.8 - 5.6 %    Comment: (NOTE)         Prediabetes: 5.7 - 6.4         Diabetes: >6.4         Glycemic control for adults with diabetes: <7.0    Mean Plasma Glucose 120 mg/dL    Comment: (NOTE) Performed At: [facility] [address] [place], [place] [id number] Pearlean Botts MD [id number]   RPR     Status: None   Collection Time: 06/16/23  6:25 PM  Result Value Ref Range   RPR Ser Ql NON REACTIVE NON REACTIVE    Comment: Performed at [facility], [address], [place], [place] [address]  TSH     Status: None   Collection Time: 06/16/23  6:25 PM  Result Value Ref Range   TSH 3.412 0.350 - 4.500 uIU/mL    Comment: Performed by a 3rd Generation assay with a functional sensitivity of <=0.01 uIU/mL. Performed at [facility], [address] [place], [place] [address]   Vitamin B12     Status: None   Collection Time: 06/16/23  6:25 PM  Result Value  Ref Range   Vitamin B-12 393 180 - 914 pg/mL    Comment: (NOTE) This assay is not validated for testing neonatal or myeloproliferative syndrome specimens for Vitamin B12 levels. Performed at [facility], [address] [place], [place] [address]   VITAMIN D 25 Hydroxy (Vit-D Deficiency, Fractures)     Status: Abnormal   Collection Time: 06/16/23  6:25 PM  Result Value Ref Range   Vit D, 25-Hydroxy 28.91 (L) 30 - 100 ng/mL    Comment: (NOTE) Vitamin D deficiency has been defined by the Institute of Medicine  and an Endocrine Society practice guideline as a level of serum 25-OH  vitamin D less than 20 ng/mL (1,2). The Endocrine Society went on to  further define vitamin D insufficiency as a level between 21 and 29  ng/mL (2).  1. IOM (Institute of Medicine). 2010. Dietary reference intakes for  calcium  and D. [place]: [employer]. 2. Holick MF, Binkley Clifford, Bischoff-Ferrari HA, et al. Evaluation,  treatment, and prevention of vitamin D deficiency: an Endocrine  Society clinical practice guideline, JCEM. 2011 Jul; 96(7): 1911-30.  Performed at [facility], [address], [place], [place] [address]   HIV Antibody (routine testing w rflx)     Status: None   Collection Time: 06/16/23  6:25 PM  Result Value Ref Range   HIV Screen 4th Generation wRfx Non Reactive Non Reactive    Comment: Performed at [facility], [address] [place], [place] [address]    Blood Alcohol level:  Lab Results  Component Value Date   [facility] <15 06/14/2023   ETH <10 02/05/2022    Metabolic Disorder Labs: Lab Results  Component Value Date   HGBA1C 5.8 (H) 06/16/2023   MPG 120 06/16/2023   MPG 111.15 09/02/2016   No results found for: PROLACTIN Lab Results  Component Value Date   CHOL 149 05/10/2023   TRIG 201 (H) 05/10/2023   HDL 28 (L) 05/10/2023   CHOLHDL 5.3 (H) 05/10/2023   LDLCALC 87 05/10/2023   LDLCALC 106 (H) 03/27/2022     Physical  Findings: AIMS:  , ,  ,  ,    CIWA:    COWS:     Psychiatric Specialty Exam:  Presentation  General Appearance: Fairly Groomed  Eye Contact: Good  Speech: Normal Rate  Speech Volume: Normal  Handedness: Right   Mood and Affect  Mood: Depressed; Anxious  Affect: Restricted; Congruent   Thought Process  Thought Processes: Linear  Descriptions of Associations: Intact  Orientation: Full (Time, Place and Person)  Thought Content: Logical  History of Schizophrenia/Schizoaffective disorder: No  Duration of Psychotic Symptoms: NA Hallucinations: Hallucinations: None  Ideas of Reference: None  Suicidal Thoughts: Suicidal Thoughts: Yes, Passive SI Active Intent and/or Plan: Without Intent  Homicidal Thoughts: Homicidal Thoughts: No   Sensorium  Memory: Immediate Good; Recent Good  Judgment: Fair  Insight: Good   Executive Functions  Concentration: Fair  Attention Span: Good  Recall: Good  Fund of Knowledge: Good  Language: Good   Psychomotor Activity  Psychomotor Activity: Psychomotor Activity: Restlessness   Assets  Assets: Communication Skills; Desire for Improvement; Housing   Sleep  Sleep: Sleep: Good   Musculoskeletal: Strength & Muscle Tone: within normal limits Gait & Station: normal Patient leans: N/A   Physical Exam: General: Sitting comfortably. NAD. HEENT: Normocephalic, atraumatic, MMM, EMOI Lungs: no increased work of breathing noted Heart: no cyanosis Abdomen: Non distended Musculoskeletal: FROM. No obvious deformities Skin: Warm, dry, intact. No rashes noted Neuro: No obvious focal deficits.  Gait and station are normal  Review of Systems  Constitutional: Negative.   HENT: Negative.    Eyes: Negative.   Respiratory: Negative.    Cardiovascular: Negative.   Gastrointestinal: Negative.   Genitourinary: Negative.   Skin: Negative.   Neurological: Negative.   Psychiatric/Behavioral:  Positive for  depressed mood, anxiety.     Blood pressure 130/89, pulse 61, temperature 97.9 F (36.6 C), temperature source Oral, resp. rate 20, height 5' 10 (1.778 m), weight 99.3 kg, SpO2 96%. Body mass index is 31.42 kg/m.  ASSESSMENT: Sean Alexander is an 63 y.o. male who  has a past medical history of Anxiety (01/17/2014), Chronic insomnia (06/16/2023), Evaluate for Obstructive sleep apnea (01/17/2014), GAD (generalized anxiety disorder) (06/16/2023), H/O psychiatric hospitalization (06/16/2023), Hepatitis A virus infection, Hypersomnolence (01/17/2014), Kidney [place] (2017), Major depressive disorder, recurrent severe without psychotic features (HCC) (06/16/2023), OSA on CPAP (11/23/2014), RUQ pain (09/02/2016), Sleep apnea, Suicide attempt by firearm (HCC) (06/16/2023), and Unilateral primary osteoarthritis, left knee (11/18/2020).  He presented on 06/15/2023  4:00 PM for Major depressive disorder, recurrent severe without psychotic features (HCC).  He presented IVC'd for an aborted suicide attempt by shotgun.  Diagnoses / Active Problems: Patient Active Problem List   Diagnosis Date Noted   Major depressive disorder, recurrent severe without psychotic features (HCC) 06/16/2023   Suicide attempt by firearm (HCC) 06/16/2023   Suicidal ideation 06/15/2023   Generalized anxiety disorder 02/16/2018   Chronic insomnia 02/16/2018      PLAN: Safety and Monitoring:  -- Involuntary admission to inpatient psychiatric unit for safety, stabilization and treatment  -- Daily contact with patient to assess and evaluate symptoms and progress in treatment  -- Patient's case to be discussed in multi-disciplinary team meeting  -- Observation Level : q15 minute checks  -- Vital signs:  q12 hours  -- Precautions: suicide, elopement, and assault  2. Psychiatric Diagnoses and Treatment:  Patient Active Problem List   Diagnosis Date Noted   Major depressive disorder, recurrent severe without psychotic features (HCC)  06/16/2023  Suicide attempt by firearm (HCC) 06/16/2023   Suicidal ideation 06/15/2023   Generalized anxiety disorder 02/16/2018   Chronic insomnia 02/16/2018     Scheduled Medications:  cholecalciferol  2,000 Units Oral Daily   clonazePAM  0.5 mg Oral QHS   mirtazapine  15 mg Oral QHS    As Needed Medications: hydrOXYzine, magnesium hydroxide, OLANZapine, OLANZapine, OLANZapine zydis, traZODone    3. Medical Issues Being Addressed:   -- Hypovitaminosis D as above; lifestyle modifications recommended for prediabetes  Labs reviewed, unremarkable with the exception of: Hypovitaminosis D  4. Discharge Planning:   -- Social work and case management to assist with discharge planning and identification of hospital follow-up needs prior to discharge  -- Estimated LOS: Likely discharge Sunday  -- Discharge Concerns: Need to establish a safety plan; Medication compliance and effectiveness  -- Discharge Goals: Return home with outpatient referrals for mental health follow-up including medication management/psychotherapy  5. Short Term Goals:  Improve ability to identify changes in lifestyle to reduce recurrence of condition, verbalize feelings, disclose and discuss suicidal ideas, demonstrate self-control, identify and develop effective coping behaviors, compliance with prescribed medications, identify triggers associated with substance abuse/mental health issues, participate in unit milieu and in scheduled group therapies   6. Long Term Goals: Improvement in symptoms so the patient is ready for discharge   --The risks/benefits/side-effects/alternatives to the medications above were discussed in detail with the patient and time was given for questions. The patient provided informed consent.   -- Metabolic profile and EKG monitoring obtained while on an atypical antipsychotic and listed in the EHR    Total Time Spent in Direct Patient Care:  I personally spent 35 minutes on the unit in  direct patient care. The direct patient care time included face-to-face time with the patient, reviewing the patient's chart, communicating with other professionals, and coordinating care. Greater than 50% of this time was spent in counseling or coordinating care with the patient regarding goals of hospitalization, psycho-education, and discharge planning needs.      Clair Crews, MD Psychiatrist  06/17/2023, 12:24 PM   I certify that inpatient services furnished can reasonably be expected to improve the patient's condition.    Portions of this note were created using voice recognition software. Minor syntax errors, grammatical content, spelling, or punctuation errors may have occurred unintentionally. Please notify the [facility] if the meaning of any statement is unclear.

## 2023-06-17 NOTE — Progress Notes (Signed)
 Pt A/O x4 with agitation noted. 2115 Pt advised during medication time that he had medication scheduled x2 meds and he could have a PRN. 2315 Pt took scheduled med and declined PRN. Pt came to the window asking for all the medications he had the previous night. Pt said; I don't know what they are, I just know I took 4 pills last night. Staff apologized and educated the importance of knowing what is taken. Medications can be written down. Pt was educated that Provider's are the ones to prescribe and D/C medication. Pt became more agitated. PRN medication for agitation offered. Pt requested the name of his medication. Pt request given. Pt then was assisted with PRN medication by Staff. No noted distress. Monitoring continues during 7p-7a shift.

## 2023-06-17 NOTE — Group Note (Signed)
 Therapy Group Note  Group Topic:Other  Group Date: 06/17/2023 Start Time: 1400 End Time: 1430 Facilitators: Amado Andal G, OT    The primary objective of this topic is to explore and understand the concept of occupational balance in the context of daily living. The term occupational balance is defined broadly, encompassing all activities that occupy an individual's time and energy, including self-care, leisure, and work-related tasks. The goal is to guide participants towards achieving a harmonious blend of these activities, tailored to their personal values and life circumstances. This balance is aimed at enhancing overall well-being, not by equally distributing time across activities, but by ensuring that daily engagements are fulfilling and not draining. The content delves into identifying various barriers that individuals face in achieving occupational balance, such as overcommitment, misaligned priorities, external pressures, and lack of effective time management. The impact of these barriers on occupational performance, roles, and lifestyles is examined, highlighting issues like reduced efficiency, strained relationships, and potential health problems. Strategies for cultivating occupational balance are a key focus. These strategies include practical methods like time blocking, prioritizing tasks, establishing self-care rituals, decluttering, connecting with nature, and engaging in reflective practices. These approaches are designed to be adaptable and applicable to a wide range of life scenarios, promoting a proactive and mindful approach to daily living. The overall aim is to equip participants with the knowledge and tools to create a balanced lifestyle that supports their mental, emotional, and physical health, thereby improving their functional performance in daily life.     Participation Level: Engaged   Participation Quality: Appropriate    Behavior: Appropriate    Speech/Thought Process: Relevant   Affect/Mood: Appropriate   Insight: Fair   Judgement: Fair      Modes of Intervention: Education  Patient Response to Interventions:  Attentive   Plan: Continue to engage patient in OT groups 2 - 3x/week.  06/17/2023  Lynnda Sas, OT  Ahmoni Edge, OT

## 2023-06-17 NOTE — Progress Notes (Signed)
 D:  Patient's self inventory sheet, patient sleeps good, sleep medication helpful.  Fair appetite, low energy level, poor concentration.  Rated depression 7, hopeless and anxiety 6.  Denied withdrawals.  Denied SI.  Denied physical problems.  Denied physical pain.  Goal is get better.  Plans to forgive myself for past actions.  Thank you.  No discharge plans. A:  Medications administered per MD orders.  Emotional support and encouragement given patient. R:  Denied SI and HI, contracts for safety.  Denied A/V hallucinations.  Safety maintained with 15 minute checks.

## 2023-06-17 NOTE — Plan of Care (Signed)
 Nurse discussed anxiety, depression and coping skills with patient.

## 2023-06-17 NOTE — [facility] Suicide Risk Assessment (Signed)
 [facility] INPATIENT:  Family/Significant Other Suicide Prevention Education  Suicide Prevention Education:  Education Completed; Hence Sean Alexander (wife) [phone],  (name of family member/significant other) has been identified by the patient as the family member/significant other with whom the patient will be residing, and identified as the person(s) who will aid the patient in the event of a mental health crisis (suicidal ideations/suicide attempt).  With written consent from the patient, the family member/significant other has been provided the following suicide prevention education, prior to the and/or following the discharge of the patient.  Sean Alexander confirmed patient will not have access to firearms/guns/weapons as firearm and bullets have been removed from the home by the police. Sean Alexander reported she will be able to assist patient safely store his medications should he need assistance. Sean Alexander reported being willing to provide as support should the patient have a mental health crisis.  The suicide prevention education provided includes the following: Suicide risk factors Suicide prevention and interventions National Suicide Hotline telephone number [facility] assessment telephone number [facility] Emergency Assistance 911 [facility] and/or Residential Mobile Crisis Unit telephone number  Request made of family/significant other to: Remove weapons (e.g., guns, rifles, knives), all items previously/currently identified as safety concern.   Remove drugs/medications (over-the-counter, prescriptions, illicit drugs), all items previously/currently identified as a safety concern.  The family member/significant other verbalizes understanding of the suicide prevention education information provided.  The family member/significant other agrees to remove the items of safety concern listed above.  Sean Alexander M Sean Alexander, LCSWA 06/17/2023, 4:22 PM

## 2023-06-17 NOTE — Progress Notes (Signed)
 CSW attempted to speak to patient regarding completing his assessment, however, patient has been in groups all morning. CSW to complete this afternoon after lunch.   Montserrath Madding, LCSWA

## 2023-06-17 NOTE — Group Note (Signed)
 LCSW Group Therapy Note   Group Date: 06/17/2023 Start Time: 1100 End Time: 1200   Participation:  patient was present.  He listened but didn't participate in the conversation.  Type of Therapy:  Group Therapy   Topic:  Stronger Together:  Building Healthy Relationships  Objective:  To explore loneliness, boundaries, and safe ways to build relationships.  Goals: Recognize healthy vs. unhealthy relationships. Learn safe ways to connect with others. Strengthen communication and [employer].  Summary:  Participants discussed loneliness, healthy connections, and setting boundaries. They explored safe ways to meet people and shared personal experiences. Key insights were reinforced through discussion and quotes.  Therapeutic Modalities Used: Cognitive Behavioral Therapy (CBT) Elements - Identifying unhealthy relationship patterns, challenging negative thoughts about connection. Dialectical Behavior Therapy (DBT) Elements - Interpersonal effectiveness, setting and maintaining boundaries. Supportive Group Therapy - Peer discussion, shared experiences, and emotional validation.   Ellwood Steidle O Keshanna Riso, LCSWA 06/17/2023  4:53 PM

## 2023-06-18 DIAGNOSIS — F332 Major depressive disorder, recurrent severe without psychotic features: Secondary | ICD-10-CM | POA: Diagnosis not present

## 2023-06-18 MED ORDER — TRAZODONE HCL 50 MG PO TABS
50.0000 mg | ORAL_TABLET | Freq: Every day | ORAL | Status: DC
  Administered 2023-06-18 – 2023-06-26 (×8): 50 mg via ORAL
  Filled 2023-06-18 (×9): qty 1

## 2023-06-18 NOTE — Plan of Care (Signed)
  Problem: Education: Goal: Knowledge of  General Education information/materials will improve Outcome: Progressing Goal: Emotional status will improve Outcome: Progressing Goal: Verbalization of understanding the information provided will improve Outcome: Progressing   Problem: Activity: Goal: Interest or engagement in activities will improve Outcome: Progressing Goal: Sleeping patterns will improve Outcome: Progressing   Problem: Coping: Goal: Ability to verbalize frustrations and anger appropriately will improve Outcome: Progressing   Problem: Health Behavior/Discharge Planning: Goal: Identification of resources available to assist in meeting health care needs will improve Outcome: Progressing

## 2023-06-18 NOTE — Group Note (Signed)
 Date:  06/18/2023 Time:  11:08 AM  Group Topic/Focus:  Goals Group:   The focus of this group is to help patients establish daily goals to achieve during treatment and discuss how the patient can incorporate goal setting into their daily lives to aide in recovery.    Participation Level:  Active  Participation Quality:  Appropriate and Attentive  Affect:  Appropriate  Cognitive:  Alert and Appropriate  Insight: Appropriate  Engagement in Group:  Engaged  Modes of Intervention:  Discussion  Additional Comments: Pt state his goal were to speak with doctor about meds.  Tita Form 06/18/2023, 11:08 AM

## 2023-06-18 NOTE — Progress Notes (Signed)
 D:  Patient denied SI and HI, contracts for safety.  Denied A/V hallucinations.  Denied pain. A:  Medications administered per MD orders.  Emotional support and encouragement given patient. R:  Safety maintained with 15 minute checks.

## 2023-06-18 NOTE — Group Note (Signed)
 Recreation Therapy Group Note   Group Topic:Problem Solving  Group Date: 06/18/2023 Start Time: 1610 End Time: 1000 Facilitators: Ameriah Lint-McCall, LRT,CTRS Location: [address] Dayroom   Group Topic: Problem Solving  Goal Area(s) Addresses:  Patient will effectively work in a team with other group members. Patient will verbalize importance of using appropriate problem solving techniques.   Behavioral Response: Engaged  Intervention: Worksheet  Activity: Dentist. Patients were given two worksheets of brain teasers. Patients got 15 minutes to complete the puzzles. Patients could work with each other if they chose to to figure out what each puzzle was. At the end of the 15 minutes, LRT would go over the answers with the group.     Education: Journalist, newspaper, Communication, Team Building  Education Outcome: Acknowledges understanding/In group clarification offered/Needs additional education.    Affect/Mood: Appropriate   Participation Level: Active   Participation Quality: Independent   Behavior: Appropriate   Speech/Thought Process: Focused   Insight: Good   Judgement: Good   Modes of Intervention: Activity   Patient Response to Interventions:  Engaged   Education Outcome:  In group clarification offered    Clinical Observations/Individualized Feedback: Pt attended and participated in group session. Pt was attentive and focused in group session.    Plan: Continue to engage patient in RT group sessions 2-3x/week.   Kristina Mcnorton-McCall, LRT,CTRS 06/18/2023 12:37 PM

## 2023-06-18 NOTE — Group Note (Signed)
 Date:  06/18/2023 Time:  10:13 PM  Group Topic/Focus:  Goals Group:   The focus of this group is to help patients establish daily goals to achieve during treatment and discuss how the patient can incorporate goal setting into their daily lives to aide in recovery. Wrap-Up Group:   The focus of this group is to help patients review their daily goal of treatment and discuss progress on daily workbooks.    Participation Level:  Active  Participation Quality:  Sharing  Affect:  Appropriate  Cognitive:  Appropriate  Insight: Appropriate  Engagement in Group:  Engaged  Modes of Intervention:  Discussion  Additional Comments:    Joann Mu 06/18/2023, 10:13 PM

## 2023-06-18 NOTE — Progress Notes (Signed)
 [facility] Progress Note  06/18/2023 1:13 PM Sean Alexander  MRN:  [id number] Principal Problem: Major depressive disorder, recurrent severe without psychotic features (HCC) Diagnosis: Principal Problem:   Major depressive disorder, recurrent severe without psychotic features (HCC) Active Problems:   Generalized anxiety disorder   Chronic insomnia   Suicidal ideation   Suicide attempt by firearm [facility])   ID & Admission Information: Sean Alexander is an 63 y.o. male who  has a past medical history of Anxiety (01/17/2014), Chronic insomnia (06/16/2023), Evaluate for Obstructive sleep apnea (01/17/2014), GAD (generalized anxiety disorder) (06/16/2023), H/O psychiatric hospitalization (06/16/2023), Hepatitis A virus infection, Hypersomnolence (01/17/2014), Kidney Sean [facility] (2017), Major depressive disorder, recurrent severe without psychotic features (HCC) (06/16/2023), OSA on CPAP (11/23/2014), RUQ pain (09/02/2016), Sleep apnea, Suicide attempt by firearm (HCC) (06/16/2023), and Unilateral primary osteoarthritis, left knee (11/18/2020).  He presented on 06/15/2023  4:00 PM for Major depressive disorder, recurrent severe without psychotic features (HCC). He presented under petition for involuntary commitment for an aborted suicide attempt by shotgun.   Subjective:   Case was discussed in the multidisciplinary team. MAR was reviewed and patient was compliant with medications.   PRN'Alexander in last 24 hours: Atarax  25 mg administered at 2335 and 0832   Psychiatric Team made the following recommendations yesterday: Continue clonazepam  0.5 mg nightly Continue Remeron  15 mg nightly  The patient reports poor sleep overnight.  The previous evening he had received as needed hydroxyzine  and trazodone  in addition to Remeron  and clonazepam  and slept well.  We discussed restarting trazodone  tonight to help with sleep.  I discontinued Atarax  because I want to see what is actually helping with his sleep, and the patient is not  able to clearly articulate why has been getting this medication.  He has trouble remembering the names of the medications and the indications, so I wrote it down for him.  His mood remains depressed and subjectively irritable.  He expressed concerns about returning to work immediately after discharge.  I would like to increase his mirtazapine , but first I would like to see how he does on the combination of trazodone , Remeron  15 mg, and clonazepam .  He denies suicidal or homicidal ideation.  He is visible in the milieu and participating in groups.   Past Psychiatric and Medical Medical History:  Past Medical History:  Diagnosis Date   Anxiety 01/17/2014   Chronic insomnia 06/16/2023   Evaluate for Obstructive sleep apnea 01/17/2014   GAD (generalized anxiety disorder) 06/16/2023   H/O psychiatric hospitalization 06/16/2023   Hepatitis A virus infection    Hypersomnolence 01/17/2014   Kidney Sean Alexander 2017   Major depressive disorder, recurrent severe without psychotic features (HCC) 06/16/2023   OSA on CPAP 11/23/2014   RUQ pain 09/02/2016   Sleep apnea    uses CPAP   Suicide attempt by firearm (HCC) 06/16/2023   aborted   Unilateral primary osteoarthritis, left knee 11/18/2020    Past Surgical History:  Procedure Laterality Date   CHOLECYSTECTOMY N/A 09/02/2016   Procedure: LAPAROSCOPIC CHOLECYSTECTOMY WITH INTRAOPERATIVE CHOLANGIOGRAM;  Surgeon: [facility];  Location: [facility];  Service: General;  Laterality: N/A;   HERNIA REPAIR  1986    Family History(Medical and Psychiatric):  Family History  Problem Relation Age of Onset   Heart disease Mother    Heart disease Father    Diabetes Brother    Colon cancer Neg Hx    Colon polyps Neg Hx    Extensive family history of depression and anxiety Per  H&P    Social History:  Social History   Substance and Sexual Activity  Alcohol Use No   Alcohol/week: 0.0 standard drinks of alcohol     Social History   Substance and  Sexual Activity  Drug Use No    Social History   Socioeconomic History   Marital status: Married    Spouse name: Sean Alexander   Number of children: 0   Years of education: Not on file   Highest education level: Not on file  Occupational History   Occupation: Psychiatrist  Tobacco Use   Smoking status: Never   Smokeless tobacco: Never  Vaping Use   Vaping status: Never Used  Substance and Sexual Activity   Alcohol use: No    Alcohol/week: 0.0 standard drinks of alcohol   Drug use: No   Sexual activity: Not Currently  Other Topics Concern   Not on file  Social History Narrative   Not on file   Social Drivers of Health   Financial Resource Strain: Not on file  Food Insecurity: No Food Insecurity (06/15/2023)   Hunger Vital Sign    Worried About Running Out of Food in the Last Year: Never true    Ran Out of Food in the Last Year: Never true  Transportation Needs: No Transportation Needs (06/15/2023)   PRAPARE - Administrator, Civil Service (Medical): No    Lack of Transportation (Non-Medical): No  Physical Activity: Not on file  Stress: Not on file  Social Connections: Not on file        Current Medications: Current Facility-Administered Medications  Medication Dose Route Frequency Provider Last Rate Last Admin   cholecalciferol  (VITAMIN D3) 25 MCG (1000 UNIT) tablet 2,000 Units  2,000 Units Oral Daily Sean Bluett Alexander, Alexander   2,000 Units at 06/18/23 0830   clonazePAM  (KLONOPIN ) tablet 0.5 mg  0.5 mg Oral QHS Sean [facility] Alexander, Alexander   0.5 mg at 06/17/23 2150   magnesium  hydroxide (MILK OF MAGNESIA) suspension 30 mL  30 mL Oral Daily PRN Sean Gauze, NP       mirtazapine  (REMERON ) tablet 15 mg  15 mg Oral QHS Sean Mcenery Alexander, Alexander   15 mg at 06/17/23 2150   OLANZapine  (ZYPREXA ) injection 10 mg  10 mg Intramuscular TID PRN Sean Gauze, NP       OLANZapine  (ZYPREXA ) injection 5 mg  5 mg Intramuscular TID PRN Sean Gauze, NP       OLANZapine  zydis  (ZYPREXA ) disintegrating tablet 5 mg  5 mg Oral TID PRN Sean Gauze, NP       traZODone  (DESYREL ) tablet 50 mg  50 mg Oral QHS Sean Foots, Alexander        Lab Results:  Results for orders placed or performed during the hospital encounter of 06/15/23 (from the past 48 hours)  Folate     Status: None   Collection Time: 06/16/23  6:25 PM  Result Value Ref Range   Folate 14.5 >5.9 ng/mL    Comment: Performed at [facility], [address], [place], [place] [address]  Hemoglobin A1c     Status: Abnormal   Collection Time: 06/16/23  6:25 PM  Result Value Ref Range   Hgb A1c MFr Bld 5.8 (H) 4.8 - 5.6 %    Comment: (NOTE)         Prediabetes: 5.7 - 6.4         Diabetes: >6.4  Glycemic control for adults with diabetes: <7.0    Mean Plasma Glucose 120 mg/dL    Comment: (NOTE) Performed At: [facility] [address] [place], [place] [id number] Pearlean Botts Alexander BJ:[id number]   RPR     Status: None   Collection Time: 06/16/23  6:25 PM  Result Value Ref Range   RPR Ser Ql NON REACTIVE NON REACTIVE    Comment: Performed at [facility], [address], [place], [place] [address]  TSH     Status: None   Collection Time: 06/16/23  6:25 PM  Result Value Ref Range   TSH 3.412 0.350 - 4.500 uIU/mL    Comment: Performed by a 3rd Generation assay with a functional sensitivity of <=0.01 uIU/mL. Performed at [facility], [address], [place], [place] [address]   Vitamin B12     Status: None   Collection Time: 06/16/23  6:25 PM  Result Value Ref Range   Vitamin B-12 393 180 - 914 pg/mL    Comment: (NOTE) This assay is not validated for testing neonatal or myeloproliferative syndrome specimens for Vitamin B12 levels. Performed at [facility], [address], [place], [place] [address]   VITAMIN D  25 Hydroxy (Vit-D Deficiency, Fractures)     Status: Abnormal   Collection Time: 06/16/23  6:25 PM  Result  Value Ref Range   Vit D, 25-Hydroxy 28.91 (L) 30 - 100 ng/mL    Comment: (NOTE) Vitamin D  deficiency has been defined by the Institute of Medicine  and an Endocrine Society practice guideline as a level of serum 25-OH  vitamin D  less than 20 ng/mL (1,2). The Endocrine Society went on to  further define vitamin D  insufficiency as a level between 21 and 29  ng/mL (2).  1. IOM (Institute of Medicine). 2010. Dietary reference intakes for  calcium  and D. [place]: [employer]. 2. Holick MF, Binkley Bangs, Bischoff-Ferrari HA, et al. Evaluation,  treatment, and prevention of vitamin D  deficiency: an Endocrine  Society clinical practice guideline, JCEM. 2011 Jul; 96(7): 1911-30.  Performed at [facility], [address] [place], [place] [address]   HIV Antibody (routine testing w rflx)     Status: None   Collection Time: 06/16/23  6:25 PM  Result Value Ref Range   HIV Screen 4th Generation wRfx Non Reactive Non Reactive    Comment: Performed at [facility], [address], [place], [place] [address]    Blood Alcohol level:  Lab Results  Component Value Date   [facility] <15 06/14/2023   ETH <10 02/05/2022    Metabolic Disorder Labs: Lab Results  Component Value Date   HGBA1C 5.8 (H) 06/16/2023   MPG 120 06/16/2023   MPG 111.15 09/02/2016   No results found for: PROLACTIN Lab Results  Component Value Date   CHOL 149 05/10/2023   TRIG 201 (H) 05/10/2023   HDL 28 (L) 05/10/2023   CHOLHDL 5.3 (H) 05/10/2023   LDLCALC 87 05/10/2023   LDLCALC 106 (H) 03/27/2022    Physical Findings: AIMS:  , ,  ,  ,    CIWA:    COWS:     Psychiatric Specialty Exam:  Presentation  General Appearance: Appropriate for Environment  Eye Contact: Good  Speech: Other (comment) (Increased rate, amount, tone, and volume)  Speech Volume: Increased  Handedness: Right   Mood and Affect  Mood: Depressed; Anxious; Irritable  Affect: Restricted;  Congruent   Thought  Process  Thought Processes: Linear  Descriptions of Associations: Intact  Orientation: Full (Time, Place and Person)  Thought Content: Logical  History of Schizophrenia/Schizoaffective disorder: No  Duration of Psychotic Symptoms: NA Hallucinations: Hallucinations: None  Ideas of Reference: None  Suicidal Thoughts: Suicidal Thoughts: No SI Active Intent and/or Plan: Without Intent  Homicidal Thoughts: Homicidal Thoughts: No   Sensorium  Memory: Recent Good; Immediate Fair  Judgment: Fair  Insight: Good   Executive Functions  Concentration: Fair  Attention Span: Fair  Recall: Fair  Fund of Knowledge: Good  Language: Good   Psychomotor Activity  Psychomotor Activity: Psychomotor Activity: Restlessness   Assets  Assets: Communication Skills; Desire for Improvement; Housing   Sleep  Sleep: Sleep: Poor   Musculoskeletal: Strength & Muscle Tone: within normal limits Gait & Station: normal Patient leans: N/A   Physical Exam: General: Sitting comfortably. NAD. HEENT: Normocephalic, atraumatic, MMM, EMOI Lungs: no increased work of breathing noted Heart: no cyanosis Abdomen: Non distended Musculoskeletal: FROM. No obvious deformities Skin: Warm, dry, intact. No rashes noted Neuro: No obvious focal deficits.  Gait and station are normal  Review of Systems  Constitutional: Negative.   HENT: Negative.    Eyes: Negative.   Respiratory: Negative.    Cardiovascular: Negative.   Gastrointestinal: Negative.   Genitourinary: Negative.   Skin: Negative.   Neurological: Negative.   Psychiatric/Behavioral:  Positive for depressed mood, anxiety.     Blood pressure (!) 135/93, pulse 64, temperature 97.6 F (36.4 C), temperature source Oral, resp. rate 20, height 5' 10 (1.778 m), weight 99.3 kg, SpO2 97%. Body mass index is 31.42 kg/m.  ASSESSMENT: ROWIN BAYRON is an 63 y.o. male who  has a past medical history of Anxiety  (01/17/2014), Chronic insomnia (06/16/2023), Evaluate for Obstructive sleep apnea (01/17/2014), GAD (generalized anxiety disorder) (06/16/2023), H/O psychiatric hospitalization (06/16/2023), Hepatitis A virus infection, Hypersomnolence (01/17/2014), Kidney Markos (2017), Major depressive disorder, recurrent severe without psychotic features (HCC) (06/16/2023), OSA on CPAP (11/23/2014), RUQ pain (09/02/2016), Sleep apnea, Suicide attempt by firearm (HCC) (06/16/2023), and Unilateral primary osteoarthritis, left knee (11/18/2020).  He presented on 06/15/2023  4:00 PM for Major depressive disorder, recurrent severe without psychotic features (HCC).  He presented IVC'd for an aborted suicide attempt by shotgun.  Diagnoses / Active Problems: Patient Active Problem List   Diagnosis Date Noted   Major depressive disorder, recurrent severe without psychotic features (HCC) 06/16/2023   Suicide attempt by firearm (HCC) 06/16/2023   Suicidal ideation 06/15/2023   Generalized anxiety disorder 02/16/2018   Chronic insomnia 02/16/2018      PLAN: Safety and Monitoring:  -- Involuntary admission to inpatient psychiatric unit for safety, stabilization and treatment  -- Daily contact with patient to assess and evaluate symptoms and progress in treatment  -- Patient'Alexander case to be discussed in multi-disciplinary team meeting  -- Observation Level : q15 minute checks  -- Vital signs:  q12 hours  -- Precautions: suicide, elopement, and assault  2. Psychiatric Diagnoses and Treatment:  Patient Active Problem List   Diagnosis Date Noted   Major depressive disorder, recurrent severe without psychotic features (HCC) 06/16/2023   Suicide attempt by firearm (HCC) 06/16/2023   Suicidal ideation 06/15/2023   Generalized anxiety disorder 02/16/2018   Chronic insomnia 02/16/2018     Scheduled Medications:  cholecalciferol   2,000 Units Oral Daily   clonazePAM   0.5 mg Oral QHS   mirtazapine   15 mg Oral QHS   traZODone    50 mg Oral QHS  As Needed Medications: magnesium  hydroxide, OLANZapine , OLANZapine , OLANZapine  zydis    3. Medical Issues Being Addressed:   -- Hypovitaminosis D as above; lifestyle modifications recommended for prediabetes  Labs reviewed, unremarkable with the exception of: Hypovitaminosis D  4. Discharge Planning:   -- Social work and case management to assist with discharge planning and identification of hospital follow-up needs prior to discharge  -- Estimated LOS: Likely discharge Sunday  -- Discharge Concerns: Need to establish a safety plan; Medication compliance and effectiveness  -- Discharge Goals: Return home with outpatient referrals for mental health follow-up including medication management/psychotherapy  5. Short Term Goals:  Improve ability to identify changes in lifestyle to reduce recurrence of condition, verbalize feelings, disclose and discuss suicidal ideas, demonstrate self-control, identify and develop effective coping behaviors, compliance with prescribed medications, identify triggers associated with substance abuse/mental health issues, participate in unit milieu and in scheduled group therapies   6. Long Term Goals: Improvement in symptoms so the patient is ready for discharge   --The risks/benefits/side-effects/alternatives to the medications above were discussed in detail with the patient and time was given for questions. The patient provided informed consent.   -- Metabolic profile and EKG monitoring obtained while on an atypical antipsychotic and listed in the EHR    Total Time Spent in Direct Patient Care:  I personally spent 35 minutes on the unit in direct patient care. The direct patient care time included face-to-face time with the patient, reviewing the patient'Alexander chart, communicating with other professionals, and coordinating care. Greater than 50% of this time was spent in counseling or coordinating care with the patient regarding goals of  hospitalization, psycho-education, and discharge planning needs.      Clair Crews, Alexander Psychiatrist  06/18/2023, 1:13 PM   I certify that inpatient services furnished can reasonably be expected to improve the patient'Alexander condition.    Portions of this note were created using voice recognition software. Minor syntax errors, grammatical content, spelling, or punctuation errors may have occurred unintentionally. Please notify the [facility] if the meaning of any statement is unclear.

## 2023-06-18 NOTE — Progress Notes (Signed)
   06/17/23 2115  Psych Admission Type (Psych Patients Only)  Admission Status Involuntary  Psychosocial Assessment  Patient Complaints Anxiety  Facial Expression Anxious  Speech Incoherent  Interaction Defensive  Motor Activity Slow  Appearance/Hygiene Other (Comment)  Behavior Characteristics Calm  Mood Apprehensive  Thought Process  Coherency WDL  Content WDL  Delusions None reported or observed  Perception WDL  Hallucination None reported or observed  Judgment Poor  Confusion Mild  Danger to Self  Current suicidal ideation?  (Denies)  Agreement Not to Harm Self Yes  Description of Agreement  Quarry manager)  Danger to Others  Danger to Others None reported or observed

## 2023-06-18 NOTE — Progress Notes (Signed)
 Patient's wife called.  She wants to talk to SW/MD about discharge plans.  Stated she is not sure about safety after discharge.  SW informed.  Patient informed that wife called RN but patient does not know wife's concerns.

## 2023-06-18 NOTE — Plan of Care (Signed)
 Nurse discussed anxiety with patient.

## 2023-06-19 DIAGNOSIS — F411 Generalized anxiety disorder: Secondary | ICD-10-CM

## 2023-06-19 DIAGNOSIS — R45851 Suicidal ideations: Secondary | ICD-10-CM

## 2023-06-19 DIAGNOSIS — F5104 Psychophysiologic insomnia: Secondary | ICD-10-CM

## 2023-06-19 MED ORDER — HYDROXYZINE HCL 25 MG PO TABS
25.0000 mg | ORAL_TABLET | Freq: Three times a day (TID) | ORAL | Status: DC | PRN
  Administered 2023-06-19 – 2023-06-27 (×7): 25 mg via ORAL
  Filled 2023-06-19 (×7): qty 1

## 2023-06-19 NOTE — Plan of Care (Signed)

## 2023-06-19 NOTE — Progress Notes (Signed)
 [facility] MD Progress Note  06/19/2023 5:40 PM Sean Alexander  MRN:  [id number] Subjective:  Chart reviewed. No significant events overnight. Patient has been compliant with prescribed medications. He reported sleeping okay last night, but not as well as a couple nights ago. Today he reported ongoing suicidal ideation with thoughts of killing himself in various ways, such as using his bed sheets to strangle or hang himself. He denies any active intent and contracts for safety. He reports feeling severely depressed and hopeless. 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 We discussed his job at length and he identifies it as a high-stress position. We discussed how he has been feeling increasingly depressed over the past two years since being promoted into this position and how it seems to be directly related to his suicidal ideation. We discussed his motivation to continue to work and the cost it seems to be extracting on his physical and mental health. We processed his thoughts and feelings regarding continuing to work and the potential shame he believes he might feel should he quit working or change jobs. We reviewed his medications and will not make any changes today. He was encouraged to discuss his occupational dilemma with his wife.   Principal Problem: Major depressive disorder, recurrent severe without psychotic features (HCC) Diagnosis: Principal Problem:   Major depressive disorder, recurrent severe without psychotic features (HCC) Active Problems:   Generalized anxiety disorder   Chronic insomnia   Suicidal ideation   Suicide attempt by firearm [facility])  Total Time spent with patient: 45 minutes   Past Medical History:  Past Medical History:  Diagnosis Date   Anxiety 01/17/2014   Chronic insomnia 06/16/2023   Evaluate for Obstructive sleep apnea 01/17/2014   GAD (generalized anxiety disorder) 06/16/2023    H/O psychiatric hospitalization 06/16/2023   Hepatitis A virus infection    Hypersomnolence 01/17/2014   Kidney Dowd 2017   Major depressive disorder, recurrent severe without psychotic features (HCC) 06/16/2023   OSA on CPAP 11/23/2014   RUQ pain 09/02/2016   Sleep apnea    uses CPAP   Suicide attempt by firearm (HCC) 06/16/2023   aborted   Unilateral primary osteoarthritis, left knee 11/18/2020    Past Surgical History:  Procedure Laterality Date   CHOLECYSTECTOMY N/A 09/02/2016   Procedure: LAPAROSCOPIC CHOLECYSTECTOMY WITH INTRAOPERATIVE CHOLANGIOGRAM;  Surgeon: Adalberto Hollow, MD;  Location: [facility];  Service: General;  Laterality: N/A;   HERNIA REPAIR  1986   Family History:  Family History  Problem Relation Age of Onset   Heart disease Mother    Heart disease Father    Diabetes Brother    Colon cancer Neg Hx    Colon polyps Neg Hx     Social History:  Social History   Substance and Sexual Activity  Alcohol Use No   Alcohol/week: 0.0 standard drinks of alcohol     Social History   Substance and Sexual Activity  Drug Use No    Social History   Socioeconomic History   Marital status: Married    Spouse name: Rice Chamorro   Number of children: 0   Years of education: Not on file   Highest education level: Not on file  Occupational History   Occupation: Psychiatrist  Tobacco Use   Smoking status: Never   Smokeless tobacco: Never  Vaping Use   Vaping status: Never Used  Substance and Sexual Activity   Alcohol use:  No    Alcohol/week: 0.0 standard drinks of alcohol   Drug use: No   Sexual activity: Not Currently  Other Topics Concern   Not on file  Social History Narrative   Not on file   Social Drivers of Health   Financial Resource Strain: Not on file  Food Insecurity: No Food Insecurity (06/15/2023)   Hunger Vital Sign    Worried About Running Out of Food in the Last Year: Never true    Ran Out of Food in the Last Year: Never  true  Transportation Needs: No Transportation Needs (06/15/2023)   PRAPARE - Administrator, Civil Service (Medical): No    Lack of Transportation (Non-Medical): No  Physical Activity: Not on file  Stress: Not on file  Social Connections: Not on file               Sleep: Fair Estimated Sleeping Duration (Last 24 Hours): 7.50-8.50 hours  Appetite:  Fair  Current Medications: Current Facility-Administered Medications  Medication Dose Route Frequency Provider Last Rate Last Admin   cholecalciferol  (VITAMIN D3) 25 MCG (1000 UNIT) tablet 2,000 Units  2,000 Units Oral Daily Timmothy Foots, MD   2,000 Units at 06/19/23 0847   clonazePAM  (KLONOPIN ) tablet 0.5 mg  0.5 mg Oral QHS Parker, Alvin S, MD   0.5 mg at 06/18/23 2153   hydrOXYzine  (ATARAX ) tablet 25 mg  25 mg Oral TID PRN Jhonny Moss, MD   25 mg at 06/19/23 1130   magnesium  hydroxide (MILK OF MAGNESIA) suspension 30 mL  30 mL Oral Daily PRN Dorthea Gauze, NP       mirtazapine  (REMERON ) tablet 15 mg  15 mg Oral QHS Parker, Alvin S, MD   15 mg at 06/18/23 2152   OLANZapine  (ZYPREXA ) injection 10 mg  10 mg Intramuscular TID PRN Dorthea Gauze, NP       OLANZapine  (ZYPREXA ) injection 5 mg  5 mg Intramuscular TID PRN Dorthea Gauze, NP       OLANZapine  zydis (ZYPREXA ) disintegrating tablet 5 mg  5 mg Oral TID PRN Dorthea Gauze, NP       traZODone  (DESYREL ) tablet 50 mg  50 mg Oral QHS Parker, Alvin S, MD   50 mg at 06/18/23 2152    Lab Results: No results found for this or any previous visit (from the past 48 hours).  Blood Alcohol level:  Lab Results  Component Value Date   [facility] <15 06/14/2023   ETH <10 02/05/2022    Metabolic Disorder Labs: Lab Results  Component Value Date   HGBA1C 5.8 (H) 06/16/2023   MPG 120 06/16/2023   MPG 111.15 09/02/2016   No results found for: PROLACTIN Lab Results  Component Value Date   CHOL 149 05/10/2023   TRIG 201 (H) 05/10/2023   HDL 28 (L) 05/10/2023   CHOLHDL 5.3  (H) 05/10/2023   LDLCALC 87 05/10/2023   LDLCALC 106 (H) 03/27/2022    Physical Findings: AIMS:  ,  ,  ,  ,  ,  ,   CIWA:    COWS:     Musculoskeletal: Strength & Muscle Tone: within normal limits Gait & Station: normal Patient leans: N/A  Psychiatric Specialty Exam:  Presentation  General Appearance:  Appropriate for Environment; Casual  Eye Contact: Good  Speech: Clear and Coherent; Normal Rate  Speech Volume: Normal  Handedness: Right   Mood and Affect  Mood: Depressed  Affect: Restricted; Depressed; Tearful   Thought Process  Thought  Processes: Coherent; Linear  Descriptions of Associations:Intact  Orientation:Full (Time, Place and Person)  Thought Content:Logical  History of Schizophrenia/Schizoaffective disorder:No  Duration of Psychotic Symptoms:No data recorded Hallucinations:Hallucinations: None  Ideas of Reference:None  Suicidal Thoughts:Suicidal Thoughts: Yes, Active SI Active Intent and/or Plan: With Intent  Homicidal Thoughts:Homicidal Thoughts: No   Sensorium  Memory: Recent Good; Immediate Fair  Judgment: Fair  Insight: Good   Executive Functions  Concentration: Fair  Attention Span: Fair  Recall: Fair  Fund of Knowledge: Good  Language: Good   Psychomotor Activity  Psychomotor Activity: Psychomotor Activity: Restlessness   Assets  Assets: Communication Skills; Desire for Improvement; Housing   Sleep  Sleep: Sleep: Poor    Physical Exam: Physical Exam Vitals and nursing note reviewed.    ROS Blood pressure 121/88, pulse 93, temperature 98.1 F (36.7 C), temperature source Oral, resp. rate 20, height 5' 10 (1.778 m), weight 99.3 kg, SpO2 96%. Body mass index is 31.42 kg/m.   Assessment and Plan: Mr. Zalyn Amend is a 63 y/o male with a history of longstanding depression that has been exacerbated over the past couple years coinciding with an increase in occupational stressors.  He was admitted on 06/15/23 for suicidal ideation with plans to shoot himself with a firearm. Firearms have since been secured in his household.  # Major Depressive Disorder, recurrent, severe, w/o psychotic features - Continues to be active and severe with SI. - Mirtazapine  15 mg at bedtime  # Generalized Anxiety Disorder - Vs. Adjustment disorder with anxiety due to occupational stressors - Mirtazapine  as above - Clonazepam  0.5 mg at bedtime. Attempting taper. Goals is to discontinue by DC.  # Insomnia - Trazodone  50 mg at bedtime - Mirtazapine  as above  # Disposition - Patient still expressing suicidal ideation. Not yet ready for discharge. - Would benefit from establishing with a therapist after DC, and also a psychiatric provider - Estimated discharge early to mid next week.   Today we spent 20 minutes engaging in psychotherapy. Modality was supportive and problem solving, specifically exploring and addressing the stress he is experiencing from work. Goal is increased insight into association between stress and current symptoms, and to decrease ambivalence over returning to work and guilt/shame over possibly changing jobs.    Jhonny Moss, MD 06/19/2023, 5:40 PM

## 2023-06-19 NOTE — Progress Notes (Addendum)
 D  Initially, this am, pt presented depressed anxious, voiced SI without a plan, but during the course of the day, pt became brighter during interactions, has been visible in the milieu, observed interacting well with peers and attending groups. Per pt's self inventory, pt rated his depression,hopelessness and anxiety a 10/10/8, respectively. Pt's stated goal today was to be truthful . Pt continues to endorse passive SI without a plan- contracts for safety- denies A/VH..  A. Labs and vitals monitored. Pt given and educated on medications. Pt supported emotionally and encouraged to express concerns and ask questions.   R. Pt remains safe with 15 minute checks. Will continue POC.    06/19/23 1700  Psych Admission Type (Psych Patients Only)  Admission Status Involuntary  Psychosocial Assessment  Patient Complaints Depression;Anxiety  Eye Contact Fair  Facial Expression Anxious  Affect Anxious  Speech Logical/coherent  Interaction Assertive  Motor Activity Slow  Appearance/Hygiene Unremarkable  Behavior Characteristics Cooperative;Appropriate to situation  Mood Depressed  Thought Process  Coherency WDL  Content WDL  Delusions None reported or observed  Perception WDL  Hallucination None reported or observed  Judgment Poor  Confusion None  Danger to Self  Current suicidal ideation? Passive  Description of Suicide Plan no plan  Agreement Not to Harm Self Yes  Description of Agreement agreed to contact staff before acting on harmful thoughts  Danger to Others  Danger to Others None reported or observed

## 2023-06-19 NOTE — Plan of Care (Signed)
  Problem: Education: Goal: Knowledge of [facility] Education information/materials will improve Outcome: Progressing   Problem: Activity: Goal: Interest or engagement in activities will improve Outcome: Progressing   

## 2023-06-19 NOTE — Progress Notes (Signed)
   06/18/23 2200  Psych Admission Type (Psych Patients Only)  Admission Status Involuntary  Psychosocial Assessment  Patient Complaints Anxiety  Eye Contact Fair  Facial Expression Anxious  Affect Anxious  Speech  (Appropriate)  Interaction Assertive  Motor Activity  (WNL)  Appearance/Hygiene Other (Comment) (WNL)  Behavior Characteristics Appropriate to situation  Mood Other (Comment) (Reserved)  Thought Process  Coherency WDL  Content WDL  Delusions None reported or observed  Perception WDL  Hallucination None reported or observed  Judgment Poor  Confusion Mild  Danger to Self  Current suicidal ideation?  (Denies)  Agreement Not to Harm Self Yes  Description of Agreement Notify Staff  Danger to Others  Danger to Others None reported or observed

## 2023-06-19 NOTE — Progress Notes (Signed)
 Adult Psychoeducational Group Note  Date:  06/19/2023 Time:  9:38 AM  Group Topic/Focus:  Goals Group:   The focus of this group is to help patients establish daily goals to achieve during treatment and discuss how the patient can incorporate goal setting into their daily lives to aide in recovery.  Participation Level:  Active  Participation Quality:  Appropriate  Affect:  Appropriate  Cognitive:  Appropriate  Insight: Appropriate  Engagement in Group:  Engaged  Modes of Intervention:  Discussion  Additional Comments:  Pt stated he is not feeling well.  Pt stated he does not have a goal for the day.  Butch Cashing D 06/19/2023, 9:38 AM

## 2023-06-19 NOTE — [facility] Group Notes (Signed)
 [facility] Group Notes:  (Nursing/MHT/Case Management/Adjunct)  Date:  06/19/2023  Time:  9:25 PM  Type of Therapy:  Psychoeducational Skills  Participation Level:  Minimal  Participation Quality:  Attentive  Affect:  Flat  Cognitive:  Lacking  Insight:  Lacking  Engagement in Group:  Limited  Modes of Intervention:  Education  Summary of Progress/Problems: The patient rated his day as a 2 out of 10 and then his day improved to a 4 out of 10. He states that being with his peers helped today. His goal for tomorrow is to keep improving.   Fina Heizer S 06/19/2023, 9:25 PM

## 2023-06-19 NOTE — Progress Notes (Signed)
   06/19/23 2247  Psych Admission Type (Psych Patients Only)  Admission Status Involuntary  Psychosocial Assessment  Patient Complaints Anxiety  Eye Contact Fair  Facial Expression Anxious  Affect Appropriate to circumstance  Speech Logical/coherent  Interaction Assertive  Motor Activity Slow  Appearance/Hygiene Unremarkable  Behavior Characteristics Appropriate to situation  Mood Depressed;Pleasant  Thought Process  Coherency WDL  Content WDL  Delusions None reported or observed  Perception WDL  Hallucination None reported or observed  Judgment Poor  Confusion None  Danger to Self  Current suicidal ideation? Denies  Self-Injurious Behavior No self-injurious ideation or behavior indicators observed or expressed   Agreement Not to Harm Self Yes  Description of Agreement verbal  Danger to Others  Danger to Others None reported or observed

## 2023-06-20 DIAGNOSIS — X749XXA Intentional self-harm by unspecified firearm discharge, initial encounter: Secondary | ICD-10-CM

## 2023-06-20 NOTE — Progress Notes (Signed)
   06/20/23 2234  Psych Admission Type (Psych Patients Only)  Admission Status Involuntary  Psychosocial Assessment  Patient Complaints Anxiety  Eye Contact Fair  Facial Expression Anxious  Affect Appropriate to circumstance  Speech Logical/coherent  Interaction Assertive  Motor Activity Slow  Appearance/Hygiene Unremarkable  Behavior Characteristics Appropriate to situation  Mood Anxious;Pleasant  Thought Process  Coherency WDL  Content WDL  Delusions None reported or observed  Perception WDL  Hallucination None reported or observed  Judgment Poor  Confusion None  Danger to Self  Current suicidal ideation? Denies  Self-Injurious Behavior No self-injurious ideation or behavior indicators observed or expressed   Agreement Not to Harm Self Yes  Description of Agreement verbal  Danger to Others  Danger to Others None reported or observed

## 2023-06-20 NOTE — Group Note (Signed)
 LCSW Group Therapy Note  Group Date: 06/20/2023 Start Time: 1100 End Time: 1200   Type of Therapy and Topic:  Group Therapy - Healthy vs Unhealthy Coping Skills  Participation Level:  Did Not Attend   Description of Group The focus of this group was to determine what unhealthy coping techniques typically are used by group members and what healthy coping techniques would be helpful in coping with various problems. Patients were guided in becoming aware of the differences between healthy and unhealthy coping techniques. Patients were asked to identify 2-3 healthy coping skills they would like to learn to use more effectively.  Therapeutic Goals Patients learned that coping is what human beings do all day long to deal with various situations in their lives Patients defined and discussed healthy vs unhealthy coping techniques Patients identified their preferred coping techniques and identified whether these were healthy or unhealthy Patients determined 2-3 healthy coping skills they would like to become more familiar with and use more often. Patients provided support and ideas to each other   Summary of Patient Progress:   Patient did not attend group.    Therapeutic Modalities Cognitive Behavioral Therapy Motivational Interviewing  Valeen Gartner 06/20/2023  11:53 AM

## 2023-06-20 NOTE — Plan of Care (Signed)
   Problem: Education: Goal: Emotional status will improve Outcome: Progressing Goal: Mental status will improve Outcome: Progressing   Problem: Activity: Goal: Interest or engagement in activities will improve Outcome: Progressing

## 2023-06-20 NOTE — Progress Notes (Addendum)
 Pt reported that his feet and ankles were swollen and stated that last night his right foot and calf were hurting. Mild non- pitting edema noted at bilateral ankles and feet. Pt denies pain at this time. MD made aware

## 2023-06-20 NOTE — Group Note (Signed)
 Date:  06/20/2023 Time:  9:10 AM  Group Topic/Focus:  Goals Group:   The focus of this group is to help patients establish daily goals to achieve during treatment and discuss how the patient can incorporate goal setting into their daily lives to aide in recovery. Orientation:   The focus of this group is to educate the patient on the purpose and policies of crisis stabilization and provide a format to answer questions about their admission.  The group details unit policies and expectations of patients while admitted.    Participation Level:  Active  Participation Quality:  Appropriate  Affect:  Appropriate  Cognitive:  Appropriate  Insight: Appropriate  Engagement in Group:  Engaged  Modes of Intervention:  Orientation  Additional Comments:  goal is to be more positive  Violette Grief 06/20/2023, 9:10 AM

## 2023-06-20 NOTE — Progress Notes (Signed)
 [facility] MD Progress Note  06/20/2023 3:06 PM Sean Alexander  MRN:  [id number] Subjective:  Chart reviewed. No significant events overnight. Patient continues to be compliant with prescribed medications. He reported sleeping fairly last night, taking about an hour to fall asleep but he reported feeling reasonably well rested this morning. He would like to stick with his current med regimen. He reported continuing passive suicidal thoughts that are particularly triggered by thinking about returning to work. He reports feeling anxious, stressed, he continues to feel dread when thinking about work, and reports a depressed mood. He denies any medication side effects. We again discussed his thoughts on returning to work and he remains uncertain, though he acknowledges that he is likely to experience a similar level of distress if he returns. We discussed at length retirement, his thoughts on it, and ways that he could remain productive if he stopped working. We also discussed other possibilities such as working elsewhere or part time.    Principal Problem: Major depressive disorder, recurrent severe without psychotic features (HCC) Diagnosis: Principal Problem:   Major depressive disorder, recurrent severe without psychotic features (HCC) Active Problems:   Generalized anxiety disorder   Chronic insomnia   Suicidal ideation   Suicide attempt by firearm [facility])  Total Time spent with patient: 45 minutes    Current Medications: Current Facility-Administered Medications  Medication Dose Route Frequency Provider Last Rate Last Admin   cholecalciferol  (VITAMIN D3) 25 MCG (1000 UNIT) tablet 2,000 Units  2,000 Units Oral Daily Timmothy Foots, MD   2,000 Units at 06/20/23 0804   clonazePAM  (KLONOPIN ) tablet 0.5 mg  0.5 mg Oral QHS Parker, Alvin S, MD   0.5 mg at 06/18/23 2153   hydrOXYzine  (ATARAX ) tablet 25 mg  25 mg Oral TID PRN Jhonny Moss, MD   25 mg at 06/19/23 2120   magnesium  hydroxide (MILK OF  MAGNESIA) suspension 30 mL  30 mL Oral Daily PRN Dorthea Gauze, NP       mirtazapine  (REMERON ) tablet 15 mg  15 mg Oral QHS Parker, Alvin S, MD   15 mg at 06/19/23 2119   OLANZapine  (ZYPREXA ) injection 10 mg  10 mg Intramuscular TID PRN Dorthea Gauze, NP       OLANZapine  (ZYPREXA ) injection 5 mg  5 mg Intramuscular TID PRN Dorthea Gauze, NP       OLANZapine  zydis (ZYPREXA ) disintegrating tablet 5 mg  5 mg Oral TID PRN Dorthea Gauze, NP       traZODone  (DESYREL ) tablet 50 mg  50 mg Oral QHS Parker, Alvin S, MD   50 mg at 06/19/23 2120    Lab Results: No results found for this or any previous visit (from the past 48 hours).  Blood Alcohol level:  Lab Results  Component Value Date   [facility] <15 06/14/2023   ETH <10 02/05/2022    Metabolic Disorder Labs: Lab Results  Component Value Date   HGBA1C 5.8 (H) 06/16/2023   MPG 120 06/16/2023   MPG 111.15 09/02/2016   No results found for: PROLACTIN Lab Results  Component Value Date   CHOL 149 05/10/2023   TRIG 201 (H) 05/10/2023   HDL 28 (L) 05/10/2023   CHOLHDL 5.3 (H) 05/10/2023   LDLCALC 87 05/10/2023   LDLCALC 106 (H) 03/27/2022     Musculoskeletal: Strength & Muscle Tone: within normal limits Gait & Station: normal Patient leans: N/A  Mental Status Exam: Casually dressed, appropriate hygiene. Friendly and open demeanor. Speech/Language is normal. Affect is  restricted and mood remains depressed. He endorses passive SI without active intent or plan. Denies HI. Denies AVH; not RIS. Thoughts are LLGD. Judgement and Insight are fair to good.   Psychomotor Activity  No abnormalities   Physical Exam: Physical Exam Vitals and nursing note reviewed.    ROS Blood pressure 133/87, pulse 61, temperature 97.8 F (36.6 C), temperature source Oral, resp. rate 20, height 5' 10 (1.778 m), weight 99.3 kg, SpO2 97%. Body mass index is 31.42 kg/m.   Assessment and Plan: Sean Alexander is a 63 y/o male with a history of  longstanding depression that has been exacerbated over the past couple years coinciding with an increase in occupational stressors. He was admitted on 06/15/23 for suicidal ideation with plans to shoot himself with a firearm. Firearms have since been secured in his household.  # Major Depressive Disorder, recurrent, severe, w/o psychotic features - Continues to be active and severe with SI. - Mirtazapine  15 mg at bedtime  # Generalized Anxiety Disorder - Vs. Adjustment disorder with anxiety due to occupational stressors - Mirtazapine  as above - Clonazepam  0.5 mg at bedtime. Consider tapering prior to DC.  # Insomnia - Trazodone  50 mg at bedtime - Mirtazapine  as above  # Disposition - Patient still expressing suicidal ideation. Not yet ready for discharge. - Would benefit from establishing with a therapist after DC, and also a psychiatric provider. - Estimated discharge early to mid next week.  - Patient has short-term disability paperwork that requires completion at or after discharge. We reviewed this today but I could not complete it as he remains hospitalized and aftercare plan has not yet been finalized.  Today we spent 25 minutes engaging in psychotherapy. Modality was supportive and problem solving, specifically exploring and addressing the stress he is experiencing from work. Goal is increased insight into association between stress and current symptoms, and to decrease ambivalence over returning to work and guilt/shame over possibly changing jobs. He is making progress.   Jhonny Moss, MD 06/20/2023, 3:06 PM

## 2023-06-20 NOTE — Group Note (Signed)
 Date:  06/20/2023 Time:  9:53 PM  Group Topic/Focus:  Wrap-Up Group:   The focus of this group is to help patients review their daily goal of treatment and discuss progress on daily workbooks.    Participation Level:  Minimal  Participation Quality:  Appropriate and Attentive  Affect:  Appropriate  Cognitive:  Alert and Appropriate  Insight: Appropriate and Good  Engagement in Group:  Engaged  Modes of Intervention:  Discussion and Education  Additional Comments:  Pt attended and participated in wrap up group this evening and rated their day a 4/10, in which they state that this rating has not changed because this is the way I am. Pt would not elaborate more on what that statement meant. Pt expresses that they have a lot going on outside of the hospital, in which staff would not be able to help with those issues. Pt would like to be D/C, and anticipated being D/C maybe Tuesday or Wednesday. Pt has no complaints at this time.   Sean Alexander 06/20/2023, 9:53 PM

## 2023-06-20 NOTE — Progress Notes (Signed)
   06/20/23 0530  15 Minute Checks  Location Bedroom  Visual Appearance Calm  Behavior Sleeping  OTHER  Documented sleep last 24 hours 9  Sleep (Behavioral Health Patients Only)  Calculate sleep? (Click Yes once per 24 hr at 0600 safety check) Yes

## 2023-06-20 NOTE — Progress Notes (Addendum)
 D. Pt presents brighter today, has been visible in the milieu, observed attending group this morning. Per pt's self inventory, pt rated his depression,hopelessness and anxiety a 9/9/6, respectively. Pt reported sleeping 'fair' last night, described his appetite as 'fair', energy level as 'low', and concentration as 'poor'. Pt continues to endorse passive SI- but attributes his hopelessness to his job situation. Pt did report that he felt a little more encouraged after speaking with the provider yesterday.  A. Labs and vitals monitored. Pt given and educated on medications. Pt supported emotionally and encouraged to express concerns and ask questions.   R. Pt remains safe with 15 minute checks. Will continue POC.    06/20/23 1100  Psych Admission Type (Psych Patients Only)  Admission Status Involuntary  Psychosocial Assessment  Patient Complaints Anxiety  Eye Contact Fair  Facial Expression Anxious  Affect Appropriate to circumstance  Speech Logical/coherent  Interaction Assertive  Motor Activity Slow  Appearance/Hygiene Unremarkable  Behavior Characteristics Cooperative;Appropriate to situation  Mood Depressed;Pleasant  Thought Process  Coherency WDL  Content WDL  Delusions None reported or observed  Perception WDL  Hallucination None reported or observed  Judgment Poor  Confusion None  Danger to Self  Current suicidal ideation? Passive  Agreement Not to Harm Self Yes  Description of Agreement agreed to contact staff before acting on harmful thoughts  Danger to Others  Danger to Others None reported or observed

## 2023-06-21 ENCOUNTER — Encounter (HOSPITAL_COMMUNITY): Payer: Self-pay

## 2023-06-21 DIAGNOSIS — F332 Major depressive disorder, recurrent severe without psychotic features: Secondary | ICD-10-CM | POA: Diagnosis not present

## 2023-06-21 MED ORDER — MIRTAZAPINE 30 MG PO TABS
30.0000 mg | ORAL_TABLET | Freq: Every day | ORAL | Status: DC
  Administered 2023-06-22 – 2023-06-26 (×5): 30 mg via ORAL
  Filled 2023-06-21 (×7): qty 1

## 2023-06-21 MED ORDER — GABAPENTIN 300 MG PO CAPS
300.0000 mg | ORAL_CAPSULE | Freq: Every day | ORAL | Status: DC
  Administered 2023-06-22 – 2023-06-26 (×5): 300 mg via ORAL
  Filled 2023-06-21 (×7): qty 1

## 2023-06-21 NOTE — Progress Notes (Signed)
   06/21/23 1000  Psych Admission Type (Psych Patients Only)  Admission Status Involuntary  Psychosocial Assessment  Patient Complaints Anxiety  Eye Contact Fair  Facial Expression Anxious  Affect Appropriate to circumstance  Speech Logical/coherent  Interaction Assertive  Motor Activity Slow  Appearance/Hygiene Unremarkable  Behavior Characteristics Appropriate to situation  Mood Pleasant  Thought Process  Coherency WDL  Content WDL  Delusions None reported or observed  Perception WDL  Hallucination None reported or observed  Judgment Poor  Confusion None  Danger to Self  Current suicidal ideation? Denies  Danger to Others  Danger to Others None reported or observed

## 2023-06-21 NOTE — Plan of Care (Signed)
   Problem: Education: Goal: Emotional status will improve Outcome: Progressing   Problem: Activity: Goal: Interest or engagement in activities will improve Outcome: Progressing

## 2023-06-21 NOTE — [facility] Group Notes (Signed)
 Spirituality Group   Group Goal: Support / Education around grief and loss    Group Description: Following introductions and group rules, group members engaged in facilitated group dialog and support around topic of loss, with particular support around experiences of loss in their lives. Group members identified types of loss (relationships / self / things) as well as patterns, circumstances, and changes that precipitate loss. Reflection invited on thoughts / feelings around loss, normalized grief responses, and recognized variety in grief experience. Group noted Worden's four tasks of grief in discussion. Group drew on Adlerian / Rogerian, narrative, MI, with [facility]'s group therapy as a primary framework.   Observations: Sean Alexander was quiet and reserved but still passively engaged in group discussion. Chaplain briefly checked-in on well-being after group; Sean Alexander was impacted by someone's story that involved suicide.  Sean Alexander L. Minetta Aly, M.Div [phone]

## 2023-06-21 NOTE — Group Note (Signed)
 Date:  06/21/2023 Time:  10:33 AM  Group Topic/Focus:  Emotional Education:   The focus of this group is to discuss what feelings/emotions are, and how they are experienced. Goals Group:   The focus of this group is to help patients establish daily goals to achieve during treatment and discuss how the patient can incorporate goal setting into their daily lives to aide in recovery. Orientation:   The focus of this group is to educate the patient on the purpose and policies of crisis stabilization and provide a format to answer questions about their admission.  The group details unit policies and expectations of patients while admitted.    Participation Level:  Did Not Attend   Sean Alexander 06/21/2023, 10:33 AM

## 2023-06-21 NOTE — Group Note (Signed)
 Recreation Therapy Group Note   Group Topic:Stress Management  Group Date: 06/21/2023 Start Time: 8469 End Time: 1003 Facilitators: Doyt Castellana-McCall, LRT,CTRS Location: 300 Hall Dayroom   Group Topic: Stress Management   Goal Area(s) Addresses:  Patient will actively participate in stress management techniques presented during session.  Patient will successfully identify benefit of practicing stress management post d/c.   Behavioral Response: Appropriate  Intervention: Relaxation exercise with ambient sound and script   Activity: Guided Imagery. LRT provided education, instruction, and demonstration on practice of visualization via guided imagery. Patient was asked to participate in the technique introduced during session. LRT debriefed including topics of mindfulness, stress management and specific scenarios each patient could use these techniques. Patients were given suggestions of ways to access scripts post d/c and encouraged to explore Youtube and other apps available on smartphones, tablets, and computers.  Education:  Stress Management, Discharge Planning.   Education Outcome: Acknowledges education   Affect/Mood: Appropriate   Participation Level: Engaged   Participation Quality: Independent   Behavior: Appropriate   Speech/Thought Process: Focused   Insight: Good   Judgement: Good   Modes of Intervention: Guided Imagery   Patient Response to Interventions:  Engaged   Education Outcome:  In group clarification offered    Clinical Observations/Individualized Feedback: Patient actively engaged in technique introduced, expressed no concerns and demonstrated ability to practice skill independently post d/c.     Plan: Continue to engage patient in RT group sessions 2-3x/week.   Sean Alexander, LRT,CTRS 06/21/2023 12:27 PM

## 2023-06-21 NOTE — BH IP Treatment Plan (Signed)
 Interdisciplinary Treatment and Diagnostic Plan Update  06/21/2023 Time of Session: UPDATE Sean Alexander MRN: [id number]  Principal Diagnosis: Major depressive disorder, recurrent severe without psychotic features (HCC)  Secondary Diagnoses: Principal Problem:   Major depressive disorder, recurrent severe without psychotic features (HCC) Active Problems:   Generalized anxiety disorder   Chronic insomnia   Suicidal ideation   Suicide attempt by firearm [facility])   Current Medications:  Current Facility-Administered Medications  Medication Dose Route Frequency Provider Last Rate Last Admin   cholecalciferol  (VITAMIN D3) 25 MCG (1000 UNIT) tablet 2,000 Units  2,000 Units Oral Daily Timmothy Foots, MD   2,000 Units at 06/21/23 7829   clonazePAM  (KLONOPIN ) tablet 0.5 mg  0.5 mg Oral QHS Parker, Alvin S, MD   0.5 mg at 06/20/23 2058   hydrOXYzine  (ATARAX ) tablet 25 mg  25 mg Oral TID PRN Jhonny Moss, MD   25 mg at 06/20/23 2058   magnesium  hydroxide (MILK OF MAGNESIA) suspension 30 mL  30 mL Oral Daily PRN Dorthea Gauze, NP       mirtazapine  (REMERON ) tablet 15 mg  15 mg Oral QHS Parker, Alvin S, MD   15 mg at 06/20/23 2058   OLANZapine  (ZYPREXA ) injection 10 mg  10 mg Intramuscular TID PRN Dorthea Gauze, NP       OLANZapine  (ZYPREXA ) injection 5 mg  5 mg Intramuscular TID PRN Dorthea Gauze, NP       OLANZapine  zydis (ZYPREXA ) disintegrating tablet 5 mg  5 mg Oral TID PRN Dorthea Gauze, NP       traZODone  (DESYREL ) tablet 50 mg  50 mg Oral QHS Parker, Alvin S, MD   50 mg at 06/20/23 2058   PTA Medications: Medications Prior to Admission  Medication Sig Dispense Refill Last Dose/Taking   ALPRAZolam  (XANAX ) 0.5 MG tablet TAKE ONE TABLET BY MOUTH TWICE A DAY AS NEEDED FOR ANXIETY (Patient taking differently: Take 0.5 mg by mouth at bedtime as needed for anxiety. TAKE ONE TABLET BY MOUTH TWICE A DAY AS NEEDED FOR ANXIETY) 60 tablet 5    Ascorbic Acid 100 MG CHEW Chew 500 mg by mouth daily.       metoprolol  succinate (TOPROL  XL) 25 MG 24 hr tablet Take 1 tablet (25 mg total) by mouth daily. 90 tablet 3    Multiple Vitamins-Minerals (CENTRUM ADULTS) TABS Take 1 tablet by mouth daily.      venlafaxine  XR (EFFEXOR -XR) 150 MG 24 hr capsule Take 1 capsule (150 mg total) by mouth daily with breakfast. (Patient not taking: Reported on 06/14/2023) 90 capsule 1     Patient Stressors:    Patient Strengths:    Treatment Modalities: Medication Management, Group therapy, Case management,  1 to 1 session with clinician, Psychoeducation, Recreational therapy.   Physician Treatment Plan for Primary Diagnosis: Major depressive disorder, recurrent severe without psychotic features (HCC) Long Term Goal(s):     Short Term Goals:    Medication Management: Evaluate patient's response, side effects, and tolerance of medication regimen.  Therapeutic Interventions: 1 to 1 sessions, Unit Group sessions and Medication administration.  Evaluation of Outcomes: Progressing  Physician Treatment Plan for Secondary Diagnosis: Principal Problem:   Major depressive disorder, recurrent severe without psychotic features (HCC) Active Problems:   Generalized anxiety disorder   Chronic insomnia   Suicidal ideation   Suicide attempt by firearm [facility])  Long Term Goal(s):     Short Term Goals:       Medication Management: Evaluate patient's response, side effects,  and tolerance of medication regimen.  Therapeutic Interventions: 1 to 1 sessions, Unit Group sessions and Medication administration.  Evaluation of Outcomes: Progressing   RN Treatment Plan for Primary Diagnosis: Major depressive disorder, recurrent severe without psychotic features (HCC) Long Term Goal(s): Knowledge of disease and therapeutic regimen to maintain health will improve  Short Term Goals: Ability to demonstrate self-control, Ability to verbalize feelings will improve, Ability to identify and develop effective coping behaviors will  improve, and Compliance with prescribed medications will improve  Medication Management: RN will administer medications as ordered by provider, will assess and evaluate patient's response and provide education to patient for prescribed medication. RN will report any adverse and/or side effects to prescribing provider.  Therapeutic Interventions: 1 on 1 counseling sessions, Psychoeducation, Medication administration, Evaluate responses to treatment, Monitor vital signs and CBGs as ordered, Perform/monitor CIWA, COWS, AIMS and Fall Risk screenings as ordered, Perform wound care treatments as ordered.  Evaluation of Outcomes: Progressing   LCSW Treatment Plan for Primary Diagnosis: Major depressive disorder, recurrent severe without psychotic features (HCC) Long Term Goal(s): Safe transition to appropriate next level of care at discharge, Engage patient in therapeutic group addressing interpersonal concerns.  Short Term Goals: Engage patient in aftercare planning with referrals and resources, Increase social support, Increase ability to appropriately verbalize feelings, and Facilitate acceptance of mental health diagnosis and concerns  Therapeutic Interventions: Assess for all discharge needs, 1 to 1 time with Social worker, Explore available resources and support systems, Assess for adequacy in community support network, Educate family and significant other(s) on suicide prevention, Complete Psychosocial Assessment, Interpersonal group therapy.  Evaluation of Outcomes: Progressing   Progress in Treatment: Attending groups: Yes. Participating in groups: Yes. Taking medication as prescribed: patient has not been prescribed any mediations yet Toleration medication: N/A Family/Significant other contact made: consents are pending Patient understands diagnosis: Yes. Discussing patient identified problems/goals with staff: Yes. Medical problems stabilized or resolved: Yes. Denies  suicidal/homicidal ideation: Yes. Issues/concerns per patient self-inventory: No.   New problem(s) identified:  No   New Short Term/Long Term Goal(s):     medication stabilization, elimination of SI thoughts, development of comprehensive mental wellness plan.    Patient Goals:  I want to get my life on track and become a better person for my wife.      Discharge Plan or Barriers:  Patient recently admitted. CSW will continue to follow and assess for appropriate referrals and possible discharge planning.    Reason for Continuation of Hospitalization: Anxiety Depression Medication stabilization Suicidal ideation   Estimated Length of Stay:  5 - 7 days  Last 3 [place] Suicide Severity Risk Score: Flowsheet Row Ad[facility] (Current) [facility] from [facility]14/2023 in [facility] ED from 09/26/2022 in [facility] Emergency Department at [facility]  C-SSRS RISK CATEGORY High Risk High Risk No Risk    Last [facility] 2/9 Scores:     No data to display          Scribe for Treatment Team: Aidenn Skellenger N Marrell Dicaprio, LCSW 06/21/2023 4:50 PM

## 2023-06-21 NOTE — Progress Notes (Signed)
   06/21/23 2300  Psych Admission Type (Psych Patients Only)  Admission Status Involuntary  Psychosocial Assessment  Patient Complaints Anxiety  Eye Contact Fair  Facial Expression Anxious  Affect Appropriate to circumstance  Speech Logical/coherent  Interaction Assertive  Motor Activity Slow  Appearance/Hygiene Unremarkable  Behavior Characteristics Appropriate to situation  Mood Anxious;Pleasant  Thought Process  Coherency WDL  Content WDL  Delusions None reported or observed  Perception WDL  Hallucination None reported or observed  Judgment Poor  Confusion None  Danger to Self  Current suicidal ideation? Denies  Self-Injurious Behavior No self-injurious ideation or behavior indicators observed or expressed   Agreement Not to Harm Self Yes  Description of Agreement verbal  Danger to Others  Danger to Others None reported or observed

## 2023-06-21 NOTE — Group Note (Signed)
 Occupational Therapy Group Note  Group Topic: Sleep Hygiene  Group Date: 06/21/2023 Start Time: 1430 End Time: 1500 Facilitators: Lynnda Sas, OT   Group Description: Group encouraged increased participation and engagement through topic focused on sleep hygiene. Patients reflected on the quality of sleep they typically receive and identified areas that need improvement. Group was given background information on sleep and sleep hygiene, including common sleep disorders. Group members also received information on how to improve one's sleep and introduced a sleep diary as a tool that can be utilized to track sleep quality over a length of time. Group session ended with patients identifying one or more strategies they could utilize or implement into their sleep routine in order to improve overall sleep quality.        Therapeutic Goal(s):  Identify one or more strategies to improve overall sleep hygiene  Identify one or more areas of sleep that are negatively impacted (sleep too much, too little, etc)     Participation Level: Engaged   Participation Quality: Independent   Behavior: Appropriate   Speech/Thought Process: Relevant   Affect/Mood: Appropriate   Insight: Fair   Judgement: Fair      Modes of Intervention: Education  Patient Response to Interventions:  Attentive   Plan: Continue to engage patient in OT groups 2 - 3x/week.  06/21/2023  Lynnda Sas, OT   Mykel Sponaugle, OT

## 2023-06-21 NOTE — Progress Notes (Signed)
 [facility] MD Progress Note  06/21/2023 7:30 PM Sean Alexander  MRN:  [id number] Subjective:   63 year old Caucasian male, married, employed, lives with his family.  Background history of MDD recurrent.  Presented in company of his friend on account of worsening depression associated with suicidal thoughts.  Reported to have been going to the park with his gun instead of going to his job.  Wife's physical health issue is major stressor for him. Routine labs were essentially normal.  No detectable alcohol.  UDS positive for benzodiazepine which is prescribed.  I assumed care of this patient today.  Chart reviewed.  Patient discussed at multidisciplinary team meeting.  Nursing staff reports that he slept for 7.5 hours.  No challenging behavior.  He has been participating with unit groups and therapeutic activities.  He continues to report passive suicidal thoughts.  No intent to act on it.  No PRNs required.  Seen today.  Patient states that he still has some residual depression.  He has tolerated recent introduction of mirtazapine  well.  States that he has noticed an increase in his appetite.  He is not endorsing any other adverse effects.  Patient reports a long history of depression.  States that he did well on 150 mg of venlafaxine  but decided to decrease the dose to 75 mg.  States that he had discontinuation syndrome days that he forgot to take his venlafaxine .  He eventually went several of venlafaxine  after 10 years.  He reports poor response to sertraline and Paxil in the past.  States that so far response to mirtazapine  is encouraging.  No current suicidal thoughts.  No current homicidal thoughts.  No manic features.  No evidence of activation.  He has remained in contact with his wife.  States that his mother-in-law is in the hospital and his wife went to visit her today.  Patient is not endorsing any other stressors in his life.  Supportive approach today.  We have agreed to optimize mirtazapine   towards antidepressant dose.  We also agreed to use low-dose gabapentin  to target restless legs.  Patient consented after reviewing the pros and cons. Encouraged to keep ventilating his feelings to staff.  Principal Problem: Major depressive disorder, recurrent severe without psychotic features (HCC) Diagnosis: Principal Problem:   Major depressive disorder, recurrent severe without psychotic features (HCC) Active Problems:   Generalized anxiety disorder   Chronic insomnia   Suicidal ideation   Suicide attempt by firearm [facility])  Total Time spent with patient: 20 minutes  Past Psychiatric History:  See H&P.  Past Medical History:  Past Medical History:  Diagnosis Date   Anxiety 01/17/2014   Chronic insomnia 06/16/2023   Evaluate for Obstructive sleep apnea 01/17/2014   GAD (generalized anxiety disorder) 06/16/2023   H/O psychiatric hospitalization 06/16/2023   Hepatitis A virus infection    Hypersomnolence 01/17/2014   Kidney Chipley 2017   Major depressive disorder, recurrent severe without psychotic features (HCC) 06/16/2023   OSA on CPAP 11/23/2014   RUQ pain 09/02/2016   Sleep apnea    uses CPAP   Suicide attempt by firearm (HCC) 06/16/2023   aborted   Unilateral primary osteoarthritis, left knee 11/18/2020    Past Surgical History:  Procedure Laterality Date   CHOLECYSTECTOMY N/A 09/02/2016   Procedure: LAPAROSCOPIC CHOLECYSTECTOMY WITH INTRAOPERATIVE CHOLANGIOGRAM;  Surgeon: Adalberto Hollow, MD;  Location: [facility];  Service: General;  Laterality: N/A;   HERNIA REPAIR  1986   Family History:  Family History  Problem Relation Age of  Onset   Heart disease Mother    Heart disease Father    Diabetes Brother    Colon cancer Neg Hx    Colon polyps Neg Hx    Family Psychiatric  History:  See H&P.  Social History:  Social History   Substance and Sexual Activity  Alcohol Use No   Alcohol/week: 0.0 standard drinks of alcohol     Social History   Substance and  Sexual Activity  Drug Use No    Social History   Socioeconomic History   Marital status: Married    Spouse name: Rice Chamorro   Number of children: 0   Years of education: Not on file   Highest education level: Not on file  Occupational History   Occupation: Psychiatrist  Tobacco Use   Smoking status: Never   Smokeless tobacco: Never  Vaping Use   Vaping status: Never Used  Substance and Sexual Activity   Alcohol use: No    Alcohol/week: 0.0 standard drinks of alcohol   Drug use: No   Sexual activity: Not Currently  Other Topics Concern   Not on file  Social History Narrative   Not on file   Social Drivers of Health   Financial Resource Strain: Not on file  Food Insecurity: No Food Insecurity (06/15/2023)   Hunger Vital Sign    Worried About Running Out of Food in the Last Year: Never true    Ran Out of Food in the Last Year: Never true  Transportation Needs: No Transportation Needs (06/15/2023)   PRAPARE - Administrator, Civil Service (Medical): No    Lack of Transportation (Non-Medical): No  Physical Activity: Not on file  Stress: Not on file  Social Connections: Not on file    Current Medications: Current Facility-Administered Medications  Medication Dose Route Frequency Provider Last Rate Last Admin   cholecalciferol  (VITAMIN D3) 25 MCG (1000 UNIT) tablet 2,000 Units  2,000 Units Oral Daily Parker, Alvin S, MD   2,000 Units at 06/21/23 0834   clonazePAM  (KLONOPIN ) tablet 0.5 mg  0.5 mg Oral QHS Parker, Alvin S, MD   0.5 mg at 06/20/23 2058   hydrOXYzine  (ATARAX ) tablet 25 mg  25 mg Oral TID PRN Jhonny Moss, MD   25 mg at 06/20/23 2058   magnesium  hydroxide (MILK OF MAGNESIA) suspension 30 mL  30 mL Oral Daily PRN Dorthea Gauze, NP       mirtazapine  (REMERON ) tablet 15 mg  15 mg Oral QHS Parker, Alvin S, MD   15 mg at 06/20/23 2058   OLANZapine  (ZYPREXA ) injection 10 mg  10 mg Intramuscular TID PRN Dorthea Gauze, NP        OLANZapine  (ZYPREXA ) injection 5 mg  5 mg Intramuscular TID PRN Dorthea Gauze, NP       OLANZapine  zydis (ZYPREXA ) disintegrating tablet 5 mg  5 mg Oral TID PRN Dorthea Gauze, NP       traZODone  (DESYREL ) tablet 50 mg  50 mg Oral QHS Parker, Alvin S, MD   50 mg at 06/20/23 2058    Lab Results: No results found for this or any previous visit (from the past 48 hours).  Blood Alcohol level:  Lab Results  Component Value Date   [facility] <15 06/14/2023   ETH <10 02/05/2022    Metabolic Disorder Labs: Lab Results  Component Value Date   HGBA1C 5.8 (H) 06/16/2023   MPG 120 06/16/2023   MPG 111.15 09/02/2016   No results  found for: PROLACTIN Lab Results  Component Value Date   CHOL 149 05/10/2023   TRIG 201 (H) 05/10/2023   HDL 28 (L) 05/10/2023   CHOLHDL 5.3 (H) 05/10/2023   LDLCALC 87 05/10/2023   LDLCALC 106 (H) 03/27/2022    Physical Findings: AIMS:  ,  ,  ,  ,  ,  ,   CIWA:    COWS:     Musculoskeletal: Strength & Muscle Tone: within normal limits Gait & Station: normal Patient leans: N/A  Psychiatric Specialty Exam:  Presentation  General Appearance and behavior:  Overweight, not in any distress, good rapport.  No EPS.  Eye Contact: Good.  Speech: Spontaneous.  Soft spoken.  Mood and Affect  Mood: Depressed  Affect: Restricted and appropriate.  Thought Process  Thought Processes: Linear and goal directed.  Descriptions of Associations:Intact  Orientation:Full (Time, Place and Person)  Thought Content: Less negative ruminations.  No guilty rumination.  No current suicidal thoughts.  No homicidal thoughts.  No thoughts of violence.   No delusional theme.  No obsessions.  Hallucinations: No hallucination in any modality.  Sensorium  Memory: Good.  Judgment: Good.  Insight: Good  Executive Functions  Concentration: Good.  Attention Span: Good.  Recall: Good.  Fund of Knowledge: Good.  Language: Good   Psychomotor Activity   Normal psychomotor activity    Physical Exam: Physical Exam ROS Blood pressure (!) 121/93, pulse 84, temperature 97.8 F (36.6 C), temperature source Oral, resp. rate 16, height 5' 10 (1.778 m), weight 99.3 kg, SpO2 97%. Body mass index is 31.42 kg/m.   Treatment Plan Summary: Patient has an extensive history of unipolar depression.  Admitted on account of worsening depression associated with suicidal ideation.  Wife's physical health issue as a major perpetuating factor.  He was started on mirtazapine  which he is tolerating well.  We will adjust medications as below.  We will evaluate him further.  1.  Increase mirtazapine  to 30 mg at bedtime. 2.  Add gabapentin  300 mg at bedtime only. 3.  Continue trazodone  50 mg at bedtime as needed. 4.  Continue clonazepam  0.5 mg at bedtime. 5.  Continue to encourage unit groups and therapeutic activities. 6.  Continue to monitor mood behavior and interaction with others. 7.  Social worker will coordinate discharge and aftercare planning.  Amelie Jury, MD 06/21/2023, 7:30 PM

## 2023-06-21 NOTE — Progress Notes (Signed)
   06/21/23 0529  15 Minute Checks  Location Bedroom  Visual Appearance Calm  Behavior Sleeping  OTHER  Documented sleep last 24 hours 7.5  Sleep (Behavioral Health Patients Only)  Calculate sleep? (Click Yes once per 24 hr at 0600 safety check) Yes

## 2023-06-21 NOTE — [facility] Group Notes (Signed)
 The focus of this group is to help patients review their daily goal of treatment and discuss progress on daily workbooks. Pt was attentive and appropriate during tonight's wrap up group with aa.

## 2023-06-22 NOTE — Group Note (Signed)
 LCSW Group Therapy Note   Group Date: 06/22/2023 Start Time: 1100 End Time: 1200  Participation:  patient was present and actively participated in the discussion  Type of Therapy:  Group Therapy  Topic:  Understanding Your Path to Change  Objective:  The goal is to help individuals understand the stages of change, identify where they currently are in the process, and provide actionable next steps to continue moving forward in their journey of change.  Goals: Learn about the six stages of change:  Precontemplation, Contemplation, Preparation, Action, Maintenance, and Relapse Reflect on Current Change Efforts:  Recognize which stage participants are in regarding a personal change. Plan Next Steps for Moving Forward:  Create an action plan based on their current stage of change.  Class Summary:  In this session, we explored the Stages of Change as a framework to understand the process of change.  We discussed how each stage helps individuals recognize where they are in their personal journey and used the Stages of Change Worksheet for self-reflection. Participants answered questions to better understand their current stage, challenges, and progress. We also emphasized the importance of moving forward, even if setbacks (Relapse) occur, and created actionable steps to help participants continue progressing. By the end of the session, participants gained a clearer understanding of their path to change and left with a clear plan for next steps.  Therapeutic Modalities:  Elements of CBT (cognitive restructuring, problem solving)  Element of DBT (mindfulness, distress tolerance)   Ela Moffat O Colbie Sliker, LCSWA 06/22/2023  12:19 PM

## 2023-06-22 NOTE — Progress Notes (Signed)
 [facility] MD Progress Note  06/22/2023 3:06 PM Sean Alexander  MRN:  [id number] Subjective:   63 year old Caucasian male, married, employed, lives with his family.  Background history of MDD recurrent.  Presented in company of his friend on account of worsening depression associated with suicidal thoughts.  Reported to have been going to the park with his gun instead of going to his job.  Wife's physical health issue is major stressor for him. Routine labs were essentially normal.  No detectable alcohol.  UDS positive for benzodiazepine which is prescribed.  Chart reviewed today.  Patient discussed at multidisciplinary team meeting.  Nursing staff reports that he has been adherent to this medication.  He tolerated recent medication adjustment well.  He is still slightly flat on the unit.  He endorses passive death wish but no intent.  No observed response to internal stimuli.  He slept well last night.    Seen today.  Patient tolerated recent optimization in dose of mirtazapine .  States that nighttime gabapentin  was helpful with restlessness in his feet.  He was able to sleep well last night.  He feels well rested this morning.  He still reports residual depression.  He is not endorsing any manic symptoms.  He is not endorsing any psychotic symptoms.  No evidence of activation.  He is not endorsing any intent to harm himself in here.  No new stressors.  I explored augmentation of mirtazapine  with duloxetine.  He consented after reviewing the pros and cons.  We will initiate and titrate as tolerated/needed.  I have encouraged him to keep ventilating his feelings and to keep participating with unit groups.  Principal Problem: Major depressive disorder, recurrent severe without psychotic features (HCC) Diagnosis: Principal Problem:   Major depressive disorder, recurrent severe without psychotic features (HCC) Active Problems:   Generalized anxiety disorder   Chronic insomnia   Suicidal ideation   Suicide  attempt by firearm [facility])  Total Time spent with patient: 20 minutes  Past Psychiatric History:  See H&P.  Past Medical History:  Past Medical History:  Diagnosis Date   Anxiety 01/17/2014   Chronic insomnia 06/16/2023   Evaluate for Obstructive sleep apnea 01/17/2014   GAD (generalized anxiety disorder) 06/16/2023   H/O psychiatric hospitalization 06/16/2023   Hepatitis A virus infection    Hypersomnolence 01/17/2014   Kidney [place]all 2017   Major depressive disorder, recurrent severe without psychotic features (HCC) 06/16/2023   OSA on CPAP 11/23/2014   RUQ pain 09/02/2016   Sleep apnea    uses CPAP   Suicide attempt by firearm (HCC) 06/16/2023   aborted   Unilateral primary osteoarthritis, left knee 11/18/2020    Past Surgical History:  Procedure Laterality Date   CHOLECYSTECTOMY N/A 09/02/2016   Procedure: LAPAROSCOPIC CHOLECYSTECTOMY WITH INTRAOPERATIVE CHOLANGIOGRAM;  Surgeon: Adalberto Hollow, MD;  Location: [facility];  Service: General;  Laterality: N/A;   HERNIA REPAIR  1986   Family History:  Family History  Problem Relation Age of Onset   Heart disease Mother    Heart disease Father    Diabetes Brother    Colon cancer Neg Hx    Colon polyps Neg Hx    Family Psychiatric  History:  See H&P.  Social History:  Social History   Substance and Sexual Activity  Alcohol Use No   Alcohol/week: 0.0 standard drinks of alcohol     Social History   Substance and Sexual Activity  Drug Use No    Social History   Socioeconomic History  Marital status: Married    Spouse name: Rice Chamorro   Number of children: 0   Years of education: Not on file   Highest education level: Not on file  Occupational History   Occupation: Psychiatrist  Tobacco Use   Smoking status: Never   Smokeless tobacco: Never  Vaping Use   Vaping status: Never Used  Substance and Sexual Activity   Alcohol use: No    Alcohol/week: 0.0 standard drinks of alcohol   Drug use:  No   Sexual activity: Not Currently  Other Topics Concern   Not on file  Social History Narrative   Not on file   Social Drivers of Health   Financial Resource Strain: Not on file  Food Insecurity: No Food Insecurity (06/15/2023)   Hunger Vital Sign    Worried About Running Out of Food in the Last Year: Never true    Ran Out of Food in the Last Year: Never true  Transportation Needs: No Transportation Needs (06/15/2023)   PRAPARE - Administrator, Civil Service (Medical): No    Lack of Transportation (Non-Medical): No  Physical Activity: Not on file  Stress: Not on file  Social Connections: Not on file    Current Medications: Current Facility-Administered Medications  Medication Dose Route Frequency Provider Last Rate Last Admin   cholecalciferol  (VITAMIN D3) 25 MCG (1000 UNIT) tablet 2,000 Units  2,000 Units Oral Daily Parker, Alvin S, MD   2,000 Units at 06/22/23 0818   clonazePAM  (KLONOPIN ) tablet 0.5 mg  0.5 mg Oral QHS Parker, Alvin S, MD   0.5 mg at 06/21/23 2126   gabapentin  (NEURONTIN ) capsule 300 mg  300 mg Oral QHS Grae Leathers A, MD   300 mg at 06/21/23 2126   hydrOXYzine  (ATARAX ) tablet 25 mg  25 mg Oral TID PRN Jhonny Moss, MD   25 mg at 06/21/23 2126   magnesium  hydroxide (MILK OF MAGNESIA) suspension 30 mL  30 mL Oral Daily PRN Dorthea Gauze, NP       mirtazapine  (REMERON ) tablet 30 mg  30 mg Oral QHS Deeanna Beightol, Iline Mallory, MD   30 mg at 06/21/23 2126   OLANZapine  (ZYPREXA ) injection 10 mg  10 mg Intramuscular TID PRN Dorthea Gauze, NP       OLANZapine  (ZYPREXA ) injection 5 mg  5 mg Intramuscular TID PRN Dorthea Gauze, NP       OLANZapine  zydis (ZYPREXA ) disintegrating tablet 5 mg  5 mg Oral TID PRN Dorthea Gauze, NP       traZODone  (DESYREL ) tablet 50 mg  50 mg Oral QHS Parker, Alvin S, MD   50 mg at 06/21/23 2127    Lab Results: No results found for this or any previous visit (from the past 48 hours).  Blood Alcohol level:  Lab Results   Component Value Date   [facility] <15 06/14/2023   ETH <10 02/05/2022    Metabolic Disorder Labs: Lab Results  Component Value Date   HGBA1C 5.8 (H) 06/16/2023   MPG 120 06/16/2023   MPG 111.15 09/02/2016   No results found for: PROLACTIN Lab Results  Component Value Date   CHOL 149 05/10/2023   TRIG 201 (H) 05/10/2023   HDL 28 (L) 05/10/2023   CHOLHDL 5.3 (H) 05/10/2023   LDLCALC 87 05/10/2023   LDLCALC 106 (H) 03/27/2022    Physical Findings: AIMS:  ,  ,  ,  ,  ,  ,   CIWA:    COWS:  Musculoskeletal: Strength & Muscle Tone: within normal limits Gait & Station: normal Patient leans: N/A  Psychiatric Specialty Exam:  Presentation  General Appearance and behavior:  Overweight, not in any distress, interacting with peers prior to interview.  No EPS.  Eye Contact: Good.  Speech: Spontaneous.  Normalizing rate, tone and volume.  Mood and Affect  Mood: Depressed  Affect: Restricted and appropriate.  Thought Process  Thought Processes: Linear and goal directed.  Descriptions of Associations:Intact  Orientation:Full (Time, Place and Person)  Thought Content: Less negative ruminations.  No guilty rumination.  No current suicidal thoughts.  No homicidal thoughts.  No thoughts of violence.   No delusional theme.  No obsessions.  Hallucinations: No hallucination in any modality.  Sensorium  Memory: Good.  Judgment: Good.  Insight: Good  Executive Functions  Concentration: Good.  Attention Span: Good.  Recall: Good.  Fund of Knowledge: Good.  Language: Good   Psychomotor Activity  Normal psychomotor activity    Physical Exam: Physical Exam ROS Blood pressure (!) 138/97, pulse (!) 58, temperature 98 F (36.7 C), temperature source Oral, resp. rate 16, height 5' 10 (1.778 m), weight 99.3 kg, SpO2 99%. Body mass index is 31.42 kg/m.   Treatment Plan Summary: Patient has tolerated recent adjustments made.  He still has  residual depression and passive death wish.  We will add an SNRI to mirtazapine .  We will continue to evaluate for response and any adverse effects.  Not stable for a lower level of care yet.  1.  Duloxetine 30 mg daily.  We will titrate as needed/tolerated. 2.  Mirtazapine  30 mg at bedtime. 3.  Continue trazodone  50 mg at bedtime as needed. 4.  Continue clonazepam  0.5 mg at bedtime. 5.  Gabapentin  300 mg at bedtime. 6.  Continue to encourage unit groups and therapeutic activities. 7.  Continue to monitor mood behavior and interaction with others. 8.  Social worker will coordinate discharge and aftercare planning.  Amelie Jury, MD 06/22/2023, 3:06 PM

## 2023-06-22 NOTE — [facility] Group Notes (Signed)
 [facility] Group Notes:  (Nursing/MHT/Case Management/Adjunct)  Date:  06/22/2023  Time:  9:09 PM  Type of Therapy:  Psychoeducational Skills  Participation Level:  Minimal  Participation Quality:  Resistant  Affect:  Flat and Irritable  Cognitive:  Appropriate  Insight:  Improving  Engagement in Group:  Improving  Modes of Intervention:  Education  Summary of Progress/Problems: The patient rated his day as a 0 out of 10. He states that he submitted his daily assessment form today and that nobody read it. He is upset because he wrote on the form that he has multiple medical issues none of which have been addressed. Aaron Aas   Jode Lippe S 06/22/2023, 9:09 PM

## 2023-06-22 NOTE — Plan of Care (Signed)
  Problem: Education: Goal: Emotional status will improve Outcome: Progressing Goal: Mental status will improve Outcome: Progressing   Problem: Coping: Goal: Ability to demonstrate self-control will improve Outcome: Progressing

## 2023-06-22 NOTE — Group Note (Signed)
 Recreation Therapy Group Note   Group Topic:Animal Assisted Therapy   Group Date: 06/22/2023 Start Time: 1610 End Time: 1035 Facilitators: Ulanda Tackett-McCall, LRT,CTRS Location: [address] Dayroom   Animal-Assisted Activity (AAA) Program Checklist/Progress Notes Patient Eligibility Criteria Checklist & Daily Group note for Rec Tx Intervention  AAA/T Program Assumption of Risk Form signed by Patient/ or Parent Legal Guardian Yes  Patient is free of allergies or severe asthma Yes  Patient reports no fear of animals Yes  Patient reports no history of cruelty to animals Yes  Patient understands his/her participation is voluntary Yes  Patient washes hands before animal contact Yes  Patient washes hands after animal contact Yes  Behavioral Response: Engaged   Education: Charity fundraiser, Appropriate Animal Interaction   Education Outcome: Acknowledges education.    Affect/Mood: Appropriate   Participation Level: Engaged   Participation Quality: Independent   Behavior: Appropriate   Speech/Thought Process: Focused   Insight: Good   Judgement: Good   Modes of Intervention: Teaching laboratory technician   Patient Response to Interventions:  Engaged   Education Outcome:  In group clarification offered    Clinical Observations/Individualized Feedback: Patient attended session and interacted appropriately with therapy dog and peers. Patient asked appropriate questions about therapy dog and his training. Patient shared stories about their pets at home with group.    Plan: Continue to engage patient in RT group sessions 2-3x/week.   Corynne Scibilia-McCall, LRT,CTRS  06/22/2023 12:57 PM

## 2023-06-22 NOTE — Progress Notes (Signed)
   06/22/23 0505  15 Minute Checks  Location Bedroom  Visual Appearance Calm  Behavior Sleeping  OTHER  Documented sleep last 24 hours 8.25  Sleep (Behavioral Health Patients Only)  Calculate sleep? (Click Yes once per 24 hr at 0600 safety check) Yes

## 2023-06-22 NOTE — Progress Notes (Signed)
   06/22/23 0818  Psych Admission Type (Psych Patients Only)  Admission Status Involuntary  Psychosocial Assessment  Patient Complaints Depression;Anxiety  Eye Contact Fair  Facial Expression Flat  Affect Depressed  Speech Logical/coherent  Interaction Assertive  Motor Activity Slow  Appearance/Hygiene Unremarkable  Behavior Characteristics Appropriate to situation  Mood Depressed  Thought Process  Coherency WDL  Content WDL  Delusions None reported or observed  Perception WDL  Hallucination None reported or observed  Judgment Poor  Confusion None  Danger to Self  Current suicidal ideation? Passive  Description of Suicide Plan no plan  Self-Injurious Behavior Some self-injurious ideation observed or expressed.  No lethal plan expressed   Agreement Not to Harm Self Yes  Description of Agreement verbal  Danger to Others  Danger to Others None reported or observed

## 2023-06-22 NOTE — Group Note (Signed)
 Date:  06/22/2023 Time:  10:59 AM  Group Topic/Focus:  Coping With Mental Health Crisis:   The purpose of this group is to help patients identify strategies for coping with mental health crisis.  Group discusses possible causes of crisis and ways to manage them effectively. Goals Group:   The focus of this group is to help patients establish daily goals to achieve during treatment and discuss how the patient can incorporate goal setting into their daily lives to aide in recovery. Orientation:   The focus of this group is to educate the patient on the purpose and policies of crisis stabilization and provide a format to answer questions about their admission.  The group details unit policies and expectations of patients while admitted.    Participation Level:  Active  Participation Quality:  Appropriate  Affect:  Appropriate  Cognitive:  Appropriate  Insight: Appropriate  Engagement in Group:  Engaged  Modes of Intervention:  Discussion  Additional Comments:  Pt attended group.   Sean Alexander 06/22/2023, 10:59 AM

## 2023-06-23 MED ORDER — DULOXETINE HCL 60 MG PO CPEP
60.0000 mg | ORAL_CAPSULE | Freq: Every day | ORAL | Status: DC
  Administered 2023-06-24 – 2023-06-27 (×4): 60 mg via ORAL
  Filled 2023-06-23 (×4): qty 1

## 2023-06-23 MED ORDER — DULOXETINE HCL 30 MG PO CPEP
30.0000 mg | ORAL_CAPSULE | Freq: Once | ORAL | Status: AC
  Administered 2023-06-23: 30 mg via ORAL
  Filled 2023-06-23: qty 1

## 2023-06-23 NOTE — Plan of Care (Signed)
   Problem: Education: Goal: Emotional status will improve Outcome: Progressing Goal: Mental status will improve Outcome: Progressing

## 2023-06-23 NOTE — Progress Notes (Signed)
 [facility] MD Progress Note  06/23/2023 1:53 PM Sean Alexander  MRN:  [id number] Subjective:   63 year old Caucasian male, married, employed, lives with his family.  Background history of MDD recurrent.  Presented in company of his friend on account of worsening depression associated with suicidal thoughts.  Reported to have been going to the park with his gun instead of going to his job.  Wife's physical health issue is major stressor for him. Routine labs were essentially normal.  No detectable alcohol.  UDS positive for benzodiazepine which is prescribed.  Chart reviewed today.  Patient discussed at multidisciplinary team meeting.  Staff reports that patient has been adherent with his medication.  He slept for 7.75 hours.  He has been interacting with unit groups and therapeutic activities.  He is eats his meals.  He is drinking enough fluids.  No observed response to internal stimuli.  No PRNs required lately.  Seen today.  Patient tolerated recent introduction of duloxetine well.  States that he felt lightheaded when he got out to use the restroom last night.  No falls.  No current dizziness while getting up from supine to erect position.  Patient states that his mother-in-law is still not hostile.  He has kept in contact with his wife and the rest of the family.  Patient talked at length about his job.  States that he has been there for years.  He lost his job.  He looks forward to getting back to work.  Suicidal thoughts are lessening.  No current intent to harm self.  No manic features.  No psychotic features.  No evidence of activation. Encouraged to keep ventilating his feelings to staff.   Principal Problem: Major depressive disorder, recurrent severe without psychotic features (HCC) Diagnosis: Principal Problem:   Major depressive disorder, recurrent severe without psychotic features (HCC) Active Problems:   Generalized anxiety disorder   Chronic insomnia   Suicidal ideation   Suicide  attempt by firearm [facility])  Total Time spent with patient: 20 minutes  Past Psychiatric History:  See H&P.  Past Medical History:  Past Medical History:  Diagnosis Date   Anxiety 01/17/2014   Chronic insomnia 06/16/2023   Evaluate for Obstructive sleep apnea 01/17/2014   GAD (generalized anxiety disorder) 06/16/2023   H/O psychiatric hospitalization 06/16/2023   Hepatitis A virus infection    Hypersomnolence 01/17/2014   Kidney [place] 2017   Major depressive disorder, recurrent severe without psychotic features (HCC) 06/16/2023   OSA on CPAP 11/23/2014   RUQ pain 09/02/2016   Sleep apnea    uses CPAP   Suicide attempt by firearm (HCC) 06/16/2023   aborted   Unilateral primary osteoarthritis, left knee 11/18/2020    Past Surgical History:  Procedure Laterality Date   CHOLECYSTECTOMY N/A 09/02/2016   Procedure: LAPAROSCOPIC CHOLECYSTECTOMY WITH INTRAOPERATIVE CHOLANGIOGRAM;  Surgeon: Adalberto Hollow, MD;  Location: [facility]S;  Service: General;  Laterality: N/A;   HERNIA REPAIR  1986   Family History:  Family History  Problem Relation Age of Onset   Heart disease Mother    Heart disease Father    Diabetes Brother    Colon cancer Neg Hx    Colon polyps Neg Hx    Family Psychiatric  History:  See H&P.  Social History:  Social History   Substance and Sexual Activity  Alcohol Use No   Alcohol/week: 0.0 standard drinks of alcohol     Social History   Substance and Sexual Activity  Drug Use No  Social History   Socioeconomic History   Marital status: Married    Spouse name: Rice Chamorro   Number of children: 0   Years of education: Not on file   Highest education level: Not on file  Occupational History   Occupation: Psychiatrist  Tobacco Use   Smoking status: Never   Smokeless tobacco: Never  Vaping Use   Vaping status: Never Used  Substance and Sexual Activity   Alcohol use: No    Alcohol/week: 0.0 standard drinks of alcohol   Drug use:  No   Sexual activity: Not Currently  Other Topics Concern   Not on file  Social History Narrative   Not on file   Social Drivers of Health   Financial Resource Strain: Not on file  Food Insecurity: No Food Insecurity (06/15/2023)   Hunger Vital Sign    Worried About Running Out of Food in the Last Year: Never true    Ran Out of Food in the Last Year: Never true  Transportation Needs: No Transportation Needs (06/15/2023)   PRAPARE - Administrator, Civil Service (Medical): No    Lack of Transportation (Non-Medical): No  Physical Activity: Not on file  Stress: Not on file  Social Connections: Not on file    Current Medications: Current Facility-Administered Medications  Medication Dose Route Frequency Provider Last Rate Last Admin   cholecalciferol  (VITAMIN D3) 25 MCG (1000 UNIT) tablet 2,000 Units  2,000 Units Oral Daily Timmothy Foots, MD   2,000 Units at 06/23/23 0802   clonazePAM  (KLONOPIN ) tablet 0.5 mg  0.5 mg Oral QHS Parker, Alvin S, MD   0.5 mg at 06/22/23 2142   [START ON 06/24/2023] DULoxetine (CYMBALTA) DR capsule 60 mg  60 mg Oral Daily Leili Eskenazi, Iline Mallory, MD       gabapentin  (NEURONTIN ) capsule 300 mg  300 mg Oral QHS Zalika Tieszen A, MD   300 mg at 06/22/23 2142   hydrOXYzine  (ATARAX ) tablet 25 mg  25 mg Oral TID PRN Jhonny Moss, MD   25 mg at 06/21/23 2126   magnesium  hydroxide (MILK OF MAGNESIA) suspension 30 mL  30 mL Oral Daily PRN Dorthea Gauze, NP       mirtazapine  (REMERON ) tablet 30 mg  30 mg Oral QHS Sully Manzi, Iline Mallory, MD   30 mg at 06/22/23 2142   OLANZapine  (ZYPREXA ) injection 10 mg  10 mg Intramuscular TID PRN Dorthea Gauze, NP       OLANZapine  (ZYPREXA ) injection 5 mg  5 mg Intramuscular TID PRN Dorthea Gauze, NP       OLANZapine  zydis (ZYPREXA ) disintegrating tablet 5 mg  5 mg Oral TID PRN Dorthea Gauze, NP       traZODone  (DESYREL ) tablet 50 mg  50 mg Oral QHS Parker, Alvin S, MD   50 mg at 06/22/23 2142    Lab Results: No  results found for this or any previous visit (from the past 48 hours).  Blood Alcohol level:  Lab Results  Component Value Date   [facility] <15 06/14/2023   ETH <10 02/05/2022    Metabolic Disorder Labs: Lab Results  Component Value Date   HGBA1C 5.8 (H) 06/16/2023   MPG 120 06/16/2023   MPG 111.15 09/02/2016   No results found for: PROLACTIN Lab Results  Component Value Date   CHOL 149 05/10/2023   TRIG 201 (H) 05/10/2023   HDL 28 (L) 05/10/2023   CHOLHDL 5.3 (H) 05/10/2023   LDLCALC 87 05/10/2023  LDLCALC 106 (H) 03/27/2022    Physical Findings: AIMS:  ,  ,  ,  ,  ,  ,   CIWA:    COWS:     Musculoskeletal: Strength & Muscle Tone: within normal limits Gait & Station: normal Patient leans: N/A  Psychiatric Specialty Exam:  Presentation  General Appearance and behavior:  Overweight, in group prior to interview, not in any distress.  No EPS.  Eye Contact: Good.  Speech: Spontaneous.  Normal rate, tone and volume.  Mood and Affect  Mood: Subjectively and objectively better.  Affect: He lives in affect appropriately.  Thought Process  Thought Processes: Linear and goal directed.  Descriptions of Associations:Intact  Orientation:Full (Time, Place and Person)  Thought Content: Less negative ruminations.  No guilty rumination.  No current suicidal thoughts.  No homicidal thoughts.  No thoughts of violence.   No delusional theme.  No obsessions.  Hallucinations: No hallucination in any modality.  Sensorium  Memory: Good.  Judgment: Good.  Insight: Good  Executive Functions  Concentration: Good.  Attention Span: Good.  Recall: Good.  Fund of Knowledge: Good.  Language: Good   Psychomotor Activity  Normal psychomotor activity    Physical Exam: Physical Exam ROS Blood pressure 121/87, pulse 72, temperature 97.9 F (36.6 C), temperature source Oral, resp. rate 16, height 5' 10 (1.778 m), weight 99.3 kg, SpO2 95%. Body mass  index is 31.42 kg/m.   Treatment Plan Summary: Patient is responding well to adjustments made.  His mood is gradually lifting.  Suicidality is resolving.  No imminent dangerousness.  We are still titrating his medication.  Hopefully patient will be stable for discharge by weekend.  1.  Duloxetine 60 mg daily from tomorrow. 2.  Mirtazapine  30 mg at bedtime. 3.  Continue trazodone  50 mg at bedtime as needed. 4.  Continue clonazepam  0.5 mg at bedtime. 5.  Gabapentin  300 mg at bedtime. 6.  Continue to encourage unit groups and therapeutic activities. 7.  Continue to monitor mood behavior and interaction with others. 8.  Social worker will coordinate discharge and aftercare planning.  Amelie Jury, MD 06/23/2023, 1:53 PM

## 2023-06-23 NOTE — [facility] Group Notes (Signed)
 [facility] Group Notes:  (Nursing/MHT/Case Management/Adjunct)  Date:  06/23/2023  Time:  9:36 PM  Type of Therapy:  Psychoeducational Skills  Participation Level:  Minimal  Participation Quality:  Attentive  Affect:  Appropriate  Cognitive:  Appropriate  Insight:  Appropriate  Engagement in Group:  Limited  Modes of Intervention:  Education  Summary of Progress/Problems: Patient attended the evening N.A. speaker's meeting and was appropriate.   Eleonora Peeler S 06/23/2023, 9:36 PM

## 2023-06-23 NOTE — Group Note (Signed)
 Recreation Therapy Group Note   Group Topic:Other  Group Date: 06/23/2023 Start Time: 1412 End Time: 1457 Facilitators: Nellie Pester-McCall, LRT,CTRS Location: [address] Dayroom   Activity Description/Intervention: Therapeutic Drumming. Patients with peers and staff were given the opportunity to engage in a leader facilitated [facility] Group Empowerment Drumming Circle with staff from the [employer], in partnership with [employer]. Teaching laboratory technician and trained [employer], Kathlyne Parchment leading with LRT observing and documenting intervention and pt response. This evidenced-based practice targets 7 areas of health and wellbeing in the human experience including: stress-reduction, exercise, self-expression, camaraderie/support, nurturing, spirituality, and music-making (leisure).   Goal Area(s) Addresses:  Patient will engage in pro-social way in music group.  Patient will follow directions of drum leader on the first prompt. Patient will demonstrate no behavioral issues during group.  Patient will identify if a reduction in stress level occurs as a result of participation in therapeutic drum circle.    Education: Leisure exposure, Pharmacologist, Musical expression, Discharge Planning   Affect/Mood: Appropriate   Participation Level: Engaged   Participation Quality: Independent   Behavior: Appropriate   Speech/Thought Process: Focused   Insight: Good   Judgement: Good   Modes of Intervention: Teaching laboratory technician   Patient Response to Interventions:  Engaged   Education Outcome:  In group clarification offered    Clinical Observations/Individualized Feedback: Geralyn Knee actively engaged in therapeutic drumming exercise and discussions. Pt was appropriate with peers, staff, and musical equipment for duration of programming.  Pt identified serenity as their feeling after participation in music-based programming. Pt affect congruent with verbalized  emotion.      Plan: Continue to engage patient in RT group sessions 2-3x/week.   Breyanna Valera-McCall, LRT,CTRS  06/23/2023 3:50 PM

## 2023-06-23 NOTE — B[facility] Group Notes (Signed)
 Spirituality Group   Description: Participant directed exploration of values, beliefs and meaning   Following a brief framework of chaplain's role and ground rules of group behavior, participants are invited to share concerns or questions that engage spiritual life. Emphasis placed on common themes and shared experiences and ways to make meaning and clarify living into one's values.   Theory/Process/Goal: Utilize the theoretical framework of group therapy established by Derrell Flight, Relational Cultural Theory and Rogerian approaches to facilitate relational empathy and use of the "here and now" to foster reflection, self-awareness, and sharing.   Observations: Sean Alexander was an active participant in the group discussion.  Ornella Coderre L. Minetta Aly, M.Div [phone]

## 2023-06-23 NOTE — Group Note (Unsigned)
 Date:  06/23/2023 Time:  9:07 AM  Group Topic/Focus:  Goals Group:   The focus of this group is to help patients establish daily goals to achieve during treatment and discuss how the patient can incorporate goal setting into their daily lives to aide in recovery.     Participation Level:  {BHH PARTICIPATION ZOXWR:60454}  Participation Quality:  {B[facility] PARTICIPATION QUALITY:22265}  Affect:  {BHH AFFECT:22266}  Cognitive:  {B[facility] COGNITIVE:22267}  Insight: {BHH Insight2:20797}  Engagement in Group:  {BHH ENGAGEMENT IN UJWJX:91478}  Modes of Intervention:  {BHH MODES OF INTERVENTION:22269}  Additional Comments:  ***  Tita Form 06/23/2023, 9:07 AM

## 2023-06-23 NOTE — Progress Notes (Addendum)
 D. Pt has been visible in the milieu, observed attending groups- friendly during interactions. Per pt's self inventory, pt rated his depression,hopelessness and anxiety an 8/8/9, respectively. Pt reported that he slept well last night, described his appetite as 'good', energy level as 'low', and concentration as 'poor'. Pt continues to endorse passive SI, but reports that it's a little less. Pt 's stated goal today is to work on being positive by not letting the negative thoughts spin in my head. .  A. Labs and vitals monitored. Pt given and educated on medications. Pt supported emotionally and encouraged to express concerns and ask questions.   R. Pt remains safe with 15 minute checks. Will continue POC.    06/23/23 1100  Psych Admission Type (Psych Patients Only)  Admission Status Involuntary  Psychosocial Assessment  Patient Complaints Anxiety;Depression  Eye Contact Fair  Facial Expression Anxious  Affect Anxious;Appropriate to circumstance  Speech Logical/coherent  Interaction Assertive  Motor Activity Slow  Appearance/Hygiene Unremarkable  Behavior Characteristics Appropriate to situation;Cooperative  Mood Anxious;Pleasant  Thought Process  Coherency WDL  Content WDL  Delusions None reported or observed  Perception WDL  Hallucination None reported or observed  Judgment Poor  Confusion None  Danger to Self  Current suicidal ideation? Passive  Self-Injurious Behavior No self-injurious ideation or behavior indicators observed or expressed   Danger to Others  Danger to Others None reported or observed

## 2023-06-23 NOTE — Group Note (Signed)
 Date:  06/23/2023 Time:  11:12 AM  Group Topic/Focus:  Goals Group:   The focus of this group is to help patients establish daily goals to achieve during treatment and discuss how the patient can incorporate goal setting into their daily lives to aide in recovery.    Participation Level:  Active  Participation Quality:  Appropriate and Attentive  Affect:  Appropriate  Cognitive:  Alert and Appropriate  Insight: Appropriate and Good  Engagement in Group:  Engaged  Modes of Intervention:  Discussion  Additional Comments: Pt state that he has no goals for today.  Tita Form 06/23/2023, 11:12 AM

## 2023-06-24 ENCOUNTER — Ambulatory Visit: Payer: Commercial Managed Care - PPO | Admitting: Cardiovascular Disease

## 2023-06-24 MED ORDER — CLONAZEPAM 0.25 MG PO TBDP: 0.2500 mg | ORAL_TABLET | Freq: Every day | ORAL | Status: DC

## 2023-06-24 MED ORDER — CLONAZEPAM 0.25 MG PO TBDP
0.2500 mg | ORAL_TABLET | Freq: Every day | ORAL | Status: DC
  Administered 2023-06-24: 0.25 mg via ORAL
  Filled 2023-06-24: qty 1

## 2023-06-24 NOTE — Progress Notes (Signed)
   06/23/23 2131  Psych Admission Type (Psych Patients Only)  Admission Status Involuntary  Psychosocial Assessment  Patient Complaints Anxiety;Depression;Worrying  Eye Contact Fair  Facial Expression Anxious  Affect Anxious;Apprehensive  Speech Logical/coherent  Interaction Assertive  Motor Activity Other (Comment) (WDL)  Appearance/Hygiene Unremarkable  Behavior Characteristics Appropriate to situation  Mood Anxious;Pleasant  Thought Process  Coherency WDL  Content WDL  Delusions None reported or observed  Perception WDL  Hallucination None reported or observed  Judgment Poor  Confusion None  Danger to Self  Current suicidal ideation? Passive  Description of Suicide Plan no plan  Self-Injurious Behavior No self-injurious ideation or behavior indicators observed or expressed   Agreement Not to Harm Self Yes  Description of Agreement verbal  Danger to Others  Danger to Others None reported or observed

## 2023-06-24 NOTE — Group Note (Signed)
 LCSW Group Therapy Note   Group Date: 06/24/2023 Start Time: 1100 End Time: 1200   Participation:  patient was present and actively participated in the discussion.  Type of Therapy:  Group Therapy   Topic:  Healing From Within: Understanding Our Past, Building Our Future"  Objective:  To help participants understand the impact of early experiences on mental and physical health, with a focus on Adverse Childhood Experiences (ACEs), and to explore ways to build resilience and healing.  Group Goals: Understand ACEs and Their Impact: Learn how childhood experiences shape mental and physical health. Build Resilience: Develop strategies for overcoming challenges and creating positive change. Promote Healing: Recognize the value of support and the possibility of healing through therapy and self-care.  Summary: In today's session, we discussed how early experiences, especially ACEs, impact mental and physical health. We explored the effects of stress, abuse, and neglect on brain development and well-being. The group focused on resilience, understanding that healing and positive change are possible with support and self-awareness.  Therapeutic Modalities Used: Psychoeducation: Sharing information about ACEs and their effects. Cognitive Behavioral Therapy (CBT): Helping reframe negative thought patterns. Trauma-Informed Therapy: Creating a safe, supportive space for healing.   Pate Aylward O Bayli Quesinberry, LCSWA 06/24/2023  12:44 PM

## 2023-06-24 NOTE — Progress Notes (Addendum)
 D:  Patient's self inventory sheet, patient sleeps good, no sleep medication.  Good appetite, low energy level, poor concentration.  Rated depression and anxiety 8, hopeless 7.  Denied withdrawals.  SI at times, contracts for safety.  Physical problems, swollen feet and ankles.  Denied physical pain.  Goal is pray for mother in law surgery.  Plans to practice praying more.  Thanks for the staff.  No discharge plans. A:  Medications administered per MD orders.  Emotional support and encouragement given patient. R:  Denied HI.  SI, contracts for safety.  Denied A/V hallucinations.  Safety maintained with 15 minute checks.

## 2023-06-24 NOTE — Plan of Care (Signed)
 Nurse discussed anxiety, depression and coping skills with patient.

## 2023-06-24 NOTE — [facility] Group Notes (Signed)
 [facility] Group Notes:  (Nursing/MHT/Case Management/Adjunct)  Date:  06/24/2023  Time:  2000  Type of Therapy:  Wrap up group  Participation Level:  Active  Participation Quality:  Appropriate, Attentive, Sharing, and Supportive  Affect:  Anxious  Cognitive:  Alert  Insight:  Improving  Engagement in Group:  Engaged  Modes of Intervention:  Clarification, Education, and Support  Summary of Progress/Problems: Positive thinking and positive change were discussed.   Catharine Clock 06/24/2023, 8:55 PM

## 2023-06-24 NOTE — [facility] Group Notes (Signed)
 Adult Psychoeducational Group Note  Date:  06/24/2023 Time:  11:16 AM  Group Topic/Focus:  Goals Group:   The focus of this group is to help patients establish daily goals to achieve during treatment and discuss how the patient can incorporate goal setting into their daily lives to aide in recovery.  Participation Level:  Active  Participation Quality:  Appropriate and Attentive  Affect:  Appropriate  Cognitive:  Appropriate  Insight: Appropriate  Engagement in Group:  Engaged  Modes of Intervention:  Exploration  Additional Comments:  Pt participated in Goals group. Pt stated their goal is to stop his self critical thinking, letting go of the mask. Pt stated he'll achieve this by giving himself some grace.  Hoa Deriso 06/24/2023, 11:16 AM

## 2023-06-24 NOTE — Progress Notes (Signed)
 [facility] MD Progress Note  06/24/2023 3:44 PM Sean Alexander  MRN:  [id number] Subjective:   63 year old Caucasian male, married, employed, lives with his family.  Background history of MDD recurrent.  Presented in company of his friend on account of worsening depression associated with suicidal thoughts.  Reported to have been going to the park with his gun instead of going to his job.  Wife's physical health issue is major stressor for him. Routine labs were essentially normal.  No detectable alcohol.  UDS positive for benzodiazepine which is prescribed.  Chart reviewed today.  Patient discussed at multidisciplinary team meeting.  Staff reports that patient slept for 8.25 hours.  No challenging behavior on the unit.  He is interacting appropriately with peers.  He is attending groups.  He eats his meals.  He drinks enough fluids.  Not endorsing any suicidal thoughts but expressed passive death wish.  PMP Aware review indicates that he has been chronically treated with alprazolam  5 mg twice daily.  His goal during this admission is to wean himself off benzodiazepines.  Seen today.  Patient states that he feels better with higher dose of duloxetine.  He has tolerated without any adverse effects.  Patient states that taking some time off his work has been really beneficial.  States that he realized that his job is stressful.  States that his mother-in-law had surgery today.  Patient is not as overwhelmed as he was at presentation.  He denied any suicidal thoughts.  No rageful thoughts towards others or to property.  There are no manic symptoms.  There are no psychotic symptoms.  We have agreed to lower dose of clonazepam  to 0.25 mg.  We will hopefully wean him off in a couple of days.  Encouraged to keep ventilating his feelings to staff.   Principal Problem: Major depressive disorder, recurrent severe without psychotic features (HCC) Diagnosis: Principal Problem:   Major depressive disorder, recurrent  severe without psychotic features (HCC) Active Problems:   Generalized anxiety disorder   Chronic insomnia   Suicidal ideation   Suicide attempt by firearm [facility])  Total Time spent with patient: 20 minutes  Past Psychiatric History:  See H&P.  Past Medical History:  Past Medical History:  Diagnosis Date   Anxiety 01/17/2014   Chronic insomnia 06/16/2023   Evaluate for Obstructive sleep apnea 01/17/2014   GAD (generalized anxiety disorder) 06/16/2023   H/O psychiatric hospitalization 06/16/2023   Hepatitis A virus infection    Hypersomnolence 01/17/2014   Kidney Lieurance 2017   Major depressive disorder, recurrent severe without psychotic features (HCC) 06/16/2023   OSA on CPAP 11/23/2014   RUQ pain 09/02/2016   Sleep apnea    uses CPAP   Suicide attempt by firearm (HCC) 06/16/2023   aborted   Unilateral primary osteoarthritis, left knee 11/18/2020    Past Surgical History:  Procedure Laterality Date   CHOLECYSTECTOMY N/A 09/02/2016   Procedure: LAPAROSCOPIC CHOLECYSTECTOMY WITH INTRAOPERATIVE CHOLANGIOGRAM;  Surgeon: Adalberto Hollow, MD;  Location: [facility];  Service: General;  Laterality: N/A;   HERNIA REPAIR  1986   Family History:  Family History  Problem Relation Age of Onset   Heart disease Mother    Heart disease Father    Diabetes Brother    Colon cancer Neg Hx    Colon polyps Neg Hx    Family Psychiatric  History:  See H&P.  Social History:  Social History   Substance and Sexual Activity  Alcohol Use No   Alcohol/week: 0.0 standard  drinks of alcohol     Social History   Substance and Sexual Activity  Drug Use No    Social History   Socioeconomic History   Marital status: Married    Spouse name: Rice Chamorro   Number of children: 0   Years of education: Not on file   Highest education level: Not on file  Occupational History   Occupation: Psychiatrist  Tobacco Use   Smoking status: Never   Smokeless tobacco: Never  Vaping Use    Vaping status: Never Used  Substance and Sexual Activity   Alcohol use: No    Alcohol/week: 0.0 standard drinks of alcohol   Drug use: No   Sexual activity: Not Currently  Other Topics Concern   Not on file  Social History Narrative   Not on file   Social Drivers of Health   Financial Resource Strain: Not on file  Food Insecurity: No Food Insecurity (06/15/2023)   Hunger Vital Sign    Worried About Running Out of Food in the Last Year: Never true    Ran Out of Food in the Last Year: Never true  Transportation Needs: No Transportation Needs (06/15/2023)   PRAPARE - Administrator, Civil Service (Medical): No    Lack of Transportation (Non-Medical): No  Physical Activity: Not on file  Stress: Not on file  Social Connections: Not on file    Current Medications: Current Facility-Administered Medications  Medication Dose Route Frequency Provider Last Rate Last Admin   cholecalciferol  (VITAMIN D3) 25 MCG (1000 UNIT) tablet 2,000 Units  2,000 Units Oral Daily Parker, Alvin S, MD   2,000 Units at 06/24/23 0810   [START ON 06/25/2023] clonazePAM  (KLONOPIN ) disintegrating tablet 0.25 mg  0.25 mg Oral Daily Miarose Lippert, Iline Mallory, MD       DULoxetine (CYMBALTA) DR capsule 60 mg  60 mg Oral Daily Vasilios Ottaway, Iline Mallory, MD   60 mg at 06/24/23 0809   gabapentin  (NEURONTIN ) capsule 300 mg  300 mg Oral QHS Joia Doyle, Iline Mallory, MD   300 mg at 06/23/23 2109   hydrOXYzine  (ATARAX ) tablet 25 mg  25 mg Oral TID PRN Jhonny Moss, MD   25 mg at 06/21/23 2126   magnesium  hydroxide (MILK OF MAGNESIA) suspension 30 mL  30 mL Oral Daily PRN Dorthea Gauze, NP       mirtazapine  (REMERON ) tablet 30 mg  30 mg Oral QHS Giordan Fordham, Iline Mallory, MD   30 mg at 06/23/23 2109   OLANZapine  (ZYPREXA ) injection 10 mg  10 mg Intramuscular TID PRN Dorthea Gauze, NP       OLANZapine  (ZYPREXA ) injection 5 mg  5 mg Intramuscular TID PRN Dorthea Gauze, NP       OLANZapine  zydis (ZYPREXA ) disintegrating tablet 5 mg  5  mg Oral TID PRN Dorthea Gauze, NP       traZODone  (DESYREL ) tablet 50 mg  50 mg Oral QHS Parker, Alvin S, MD   50 mg at 06/23/23 2109    Lab Results: No results found for this or any previous visit (from the past 48 hours).  Blood Alcohol level:  Lab Results  Component Value Date   [facility] <15 06/14/2023   ETH <10 02/05/2022    Metabolic Disorder Labs: Lab Results  Component Value Date   HGBA1C 5.8 (H) 06/16/2023   MPG 120 06/16/2023   MPG 111.15 09/02/2016   No results found for: PROLACTIN Lab Results  Component Value Date   CHOL 149 05/10/2023  TRIG 201 (H) 05/10/2023   HDL 28 (L) 05/10/2023   CHOLHDL 5.3 (H) 05/10/2023   LDLCALC 87 05/10/2023   LDLCALC 106 (H) 03/27/2022    Physical Findings: AIMS:  ,  ,  ,  ,  ,  ,   CIWA:    COWS:     Musculoskeletal: Strength & Muscle Tone: within normal limits Gait & Station: normal Patient leans: N/A  Psychiatric Specialty Exam:  Presentation  General Appearance and behavior:  Overweight, playing a game with another peer prior to interview, not in any distress, engaged politely.  No EPS.  Eye Contact: Good.  Speech: Spontaneous.  Normal rate, tone and volume.  Mood and Affect  Mood: Subjectively and objectively better.  Affect: Mobilizing affect appropriately  Thought Process  Thought Processes: Normal speed of thought.  Linear and goal directed.  Descriptions of Associations:Intact  Orientation:Full (Time, Place and Person)  Thought Content: Future-oriented.  No negative rumination.  No guilty rumination.  No current suicidal thoughts.  No homicidal thoughts.  No thoughts of violence.   No delusional theme.  No obsessions.  Hallucinations: No hallucination in any modality.  Sensorium  Memory: Good.  Judgment: Good.  Insight: Good  Executive Functions  Concentration: Good.  Attention Span: Good.  Recall: Good.  Fund of Knowledge: Good.  Language: Good   Psychomotor Activity   Normal psychomotor activity    Physical Exam: Physical Exam ROS Blood pressure 131/89, pulse 63, temperature 97.7 F (36.5 C), temperature source Oral, resp. rate 16, height 5' 10 (1.778 m), weight 99.3 kg, SpO2 96%. Body mass index is 31.42 kg/m.   Treatment Plan Summary: Patient is responding appropriately to medication adjustments.  He is tolerating them without any adverse effects.  No overwhelming anxiety.  No dangerousness.  We will taper clonazepam  and keep his other medicines the same.  We will continue to evaluate him.  Hopeful discharge by weekend if he maintains stability.  1.  Duloxetine 60 mg daily from tomorrow. 2.  Mirtazapine  30 mg at bedtime. 3.  Continue trazodone  50 mg at bedtime as needed. 4.  Decrease clonazepam  to 0.25 mg daily. 5.  Gabapentin  300 mg at bedtime. 6.  Continue to encourage unit groups and therapeutic activities. 7.  Continue to monitor mood behavior and interaction with others. 8.  Social worker will coordinate discharge and aftercare planning.  Amelie Jury, MD 06/24/2023, 3:44 PM

## 2023-06-25 ENCOUNTER — Encounter (HOSPITAL_COMMUNITY): Payer: Self-pay

## 2023-06-25 NOTE — Progress Notes (Signed)
   06/24/23 2053  Psych Admission Type (Psych Patients Only)  Admission Status Involuntary  Psychosocial Assessment  Patient Complaints Anxiety;Depression;Worrying  Eye Contact Fair  Facial Expression Anxious  Affect Anxious;Apprehensive  Speech Logical/coherent  Interaction Assertive  Motor Activity Other (Comment) (WDL)  Appearance/Hygiene Unremarkable  Behavior Characteristics Appropriate to situation  Mood Depressed;Anxious  Thought Process  Coherency WDL  Content WDL  Delusions None reported or observed  Perception WDL  Hallucination None reported or observed  Judgment Poor  Confusion None  Danger to Self  Current suicidal ideation? Passive  Description of Suicide Plan no plan  Self-Injurious Behavior No self-injurious ideation or behavior indicators observed or expressed   Agreement Not to Harm Self Yes  Description of Agreement verbal  Danger to Others  Danger to Others None reported or observed

## 2023-06-25 NOTE — Group Note (Unsigned)
 Date:  06/25/2023 Time:  9:22 AM  Group Topic/Focus:  Goals Group:   The focus of this group is to help patients establish daily goals to achieve during treatment and discuss how the patient can incorporate goal setting into their daily lives to aide in recovery.     Participation Level:  {BHH PARTICIPATION DGUYQ:03474}  Participation Quality:  {BHH PARTICIPATION QUALITY:22265}  Affect:  {BHH AFFECT:22266}  Cognitive:  {BHH COGNITIVE:22267}  Insight: {BHH Insight2:20797}  Engagement in Group:  {BHH ENGAGEMENT IN QVZDG:38756}  Modes of Intervention:  {BHH MODES OF INTERVENTION:22269}  Additional Comments:  ***  Tita Form 06/25/2023, 9:22 AM

## 2023-06-25 NOTE — Progress Notes (Signed)
   06/25/23 1545  Spiritual Encounters  Type of Visit Follow up  Care provided to: Patient  Referral source Chaplain assessment   While rounding on unit, I offered support to [name].  [name] kept his tone light and superficial. He expressed ambivalence about Sunday discharge plan.  I provided active listening. I explored what might help him feel more confident going forward and he shared hopes for outpatient therapy plan to be more clear.  I offered relational support and words of encouragement as he sits with some uncertainty. I invited the hope and possibility of how much can evolve by that time and to stay present. I wished him peace and healing as he moves forward.  Marrah Vanevery L. Minetta Aly, M.Div [phone]

## 2023-06-25 NOTE — Group Note (Signed)
 Date:  06/25/2023 Time:  12:25 PM  Group Topic/Focus:  Wellness Toolbox:   The focus of this group is to discuss various aspects of wellness, balancing those aspects and exploring ways to increase the ability to experience wellness.  Patients will create a wellness toolbox for use upon discharge.    Participation Level:  Active  Participation Quality:  Appropriate  Affect:  Appropriate  Cognitive:  Alert and Appropriate  Insight: Appropriate and Good  Engagement in Group:  Engaged  Modes of Intervention:  Education  Additional Comments:    Tita Form 06/25/2023, 12:25 PM

## 2023-06-25 NOTE — BH IP Treatment Plan (Signed)
 Interdisciplinary Treatment and Diagnostic Plan Update  06/25/2023 Time of Session: 12:10 PM - UPDATE Sean Alexander MRN: [id number]  Principal Diagnosis: Major depressive disorder, recurrent severe without psychotic features (HCC)  Secondary Diagnoses: Principal Problem:   Major depressive disorder, recurrent severe without psychotic features (HCC) Active Problems:   Generalized anxiety disorder   Chronic insomnia   Suicidal ideation   Suicide attempt by firearm [facility])   Current Medications:  Current Facility-Administered Medications  Medication Dose Route Frequency Provider Last Rate Last Admin   cholecalciferol  (VITAMIN D3) 25 MCG (1000 UNIT) tablet 2,000 Units  2,000 Units Oral Daily Timmothy Foots, MD   2,000 Units at 06/25/23 4098   DULoxetine (CYMBALTA) DR capsule 60 mg  60 mg Oral Daily Izediuno, Iline Mallory, MD   60 mg at 06/25/23 1191   gabapentin  (NEURONTIN ) capsule 300 mg  300 mg Oral QHS Izediuno, Iline Mallory, MD   300 mg at 06/24/23 2117   hydrOXYzine  (ATARAX ) tablet 25 mg  25 mg Oral TID PRN Jhonny Moss, MD   25 mg at 06/21/23 2126   magnesium  hydroxide (MILK OF MAGNESIA) suspension 30 mL  30 mL Oral Daily PRN Dorthea Gauze, NP       mirtazapine  (REMERON ) tablet 30 mg  30 mg Oral QHS Izediuno, Vincent A, MD   30 mg at 06/24/23 2117   OLANZapine  (ZYPREXA ) injection 10 mg  10 mg Intramuscular TID PRN Dorthea Gauze, NP       OLANZapine  (ZYPREXA ) injection 5 mg  5 mg Intramuscular TID PRN Dorthea Gauze, NP       OLANZapine  zydis (ZYPREXA ) disintegrating tablet 5 mg  5 mg Oral TID PRN Dorthea Gauze, NP       traZODone  (DESYREL ) tablet 50 mg  50 mg Oral QHS Parker, Alvin S, MD   50 mg at 06/24/23 2116   PTA Medications: Medications Prior to Admission  Medication Sig Dispense Refill Last Dose/Taking   ALPRAZolam  (XANAX ) 0.5 MG tablet TAKE ONE TABLET BY MOUTH TWICE A DAY AS NEEDED FOR ANXIETY (Patient taking differently: Take 0.5 mg by mouth at bedtime as needed for anxiety.  TAKE ONE TABLET BY MOUTH TWICE A DAY AS NEEDED FOR ANXIETY) 60 tablet 5    Ascorbic Acid 100 MG CHEW Chew 500 mg by mouth daily.      metoprolol  succinate (TOPROL  XL) 25 MG 24 hr tablet Take 1 tablet (25 mg total) by mouth daily. 90 tablet 3    Multiple Vitamins-Minerals (CENTRUM ADULTS) TABS Take 1 tablet by mouth daily.      venlafaxine  XR (EFFEXOR -XR) 150 MG 24 hr capsule Take 1 capsule (150 mg total) by mouth daily with breakfast. (Patient not taking: Reported on 06/14/2023) 90 capsule 1     Patient Stressors:    Patient Strengths:    Treatment Modalities: Medication Management, Group therapy, Case management,  1 to 1 session with clinician, Psychoeducation, Recreational therapy.   Physician Treatment Plan for Primary Diagnosis: Major depressive disorder, recurrent severe without psychotic features (HCC) Long Term Goal(s):     Short Term Goals:    Medication Management: Evaluate patient's response, side effects, and tolerance of medication regimen.  Therapeutic Interventions: 1 to 1 sessions, Unit Group sessions and Medication administration.  Evaluation of Outcomes: Progressing  Physician Treatment Plan for Secondary Diagnosis: Principal Problem:   Major depressive disorder, recurrent severe without psychotic features (HCC) Active Problems:   Generalized anxiety disorder   Chronic insomnia   Suicidal ideation   Suicide attempt  by firearm [facility])  Long Term Goal(s):     Short Term Goals:       Medication Management: Evaluate patient's response, side effects, and tolerance of medication regimen.  Therapeutic Interventions: 1 to 1 sessions, Unit Group sessions and Medication administration.  Evaluation of Outcomes: Progressing   RN Treatment Plan for Primary Diagnosis: Major depressive disorder, recurrent severe without psychotic features (HCC) Long Term Goal(s): Knowledge of disease and therapeutic regimen to maintain health will improve  Short Term Goals: Ability to  remain free from injury will improve, Ability to verbalize frustration and anger appropriately will improve, Ability to verbalize feelings will improve, and Ability to disclose and discuss suicidal ideas  Medication Management: RN will administer medications as ordered by provider, will assess and evaluate patient's response and provide education to patient for prescribed medication. RN will report any adverse and/or side effects to prescribing provider.  Therapeutic Interventions: 1 on 1 counseling sessions, Psychoeducation, Medication administration, Evaluate responses to treatment, Monitor vital signs and CBGs as ordered, Perform/monitor CIWA, COWS, AIMS and Fall Risk screenings as ordered, Perform wound care treatments as ordered.  Evaluation of Outcomes: Progressing   LCSW Treatment Plan for Primary Diagnosis: Major depressive disorder, recurrent severe without psychotic features (HCC) Long Term Goal(s): Safe transition to appropriate next level of care at discharge, Engage patient in therapeutic group addressing interpersonal concerns.  Short Term Goals: Engage patient in aftercare planning with referrals and resources, Increase ability to appropriately verbalize feelings, Facilitate acceptance of mental health diagnosis and concerns, and Identify triggers associated with mental health/substance abuse issues  Therapeutic Interventions: Assess for all discharge needs, 1 to 1 time with Social worker, Explore available resources and support systems, Assess for adequacy in community support network, Educate family and significant other(s) on suicide prevention, Complete Psychosocial Assessment, Interpersonal group therapy.  Evaluation of Outcomes: Progressing   Progress in Treatment: Attending groups: Yes. Participating in groups: Yes. Taking medication as prescribed: Yes Toleration medication: Yes Family/Significant other contact made: Yes, contacted Chaney Maclaren (wife) [phone] Patient understands diagnosis: Yes. Discussing patient identified problems/goals with staff: Yes. Medical problems stabilized or resolved: Yes. Denies suicidal/homicidal ideation: Yes. Issues/concerns per patient self-inventory: No.   New problem(s) identified:  No   New Short Term/Long Term Goal(s):     medication stabilization, elimination of SI thoughts, development of comprehensive mental wellness plan.    Patient Goals:  I want to get my life on track and become a better person for my wife.      Discharge Plan or Barriers:  Patient recently admitted. CSW will continue to follow and assess for appropriate referrals and possible discharge planning.    Reason for Continuation of Hospitalization: Anxiety Depression Medication stabilization Suicidal ideation   Estimated Length of Stay:  2 - 4 days  Last 3 [place] Suicide Severity Risk Score: Flowsheet Row [facility] (Current[facility] from [facility]14/2023 in [facility] ED from 09/26/2022 in [facility] Department at [facility]  C-SSRS RISK CATEGORY High Risk High Risk No Risk    Last [facility] 2/9 Scores:     No data to display          Scribe for Treatment Team: Paitlyn Mcclatchey O Adream Parzych, LCSWA 06/25/2023 7:20 PM

## 2023-06-25 NOTE — Progress Notes (Signed)
   06/25/23 0825  Psych Admission Type (Psych Patients Only)  Admission Status Involuntary  Psychosocial Assessment  Patient Complaints Anxiety;Depression  Eye Contact Fair  Facial Expression Anxious  Affect Anxious;Apprehensive  Speech Logical/coherent  Interaction Assertive  Motor Activity Other (Comment) (WDL)  Appearance/Hygiene Unremarkable  Behavior Characteristics Cooperative;Appropriate to situation  Mood Depressed;Anxious  Thought Process  Coherency WDL  Content WDL  Delusions None reported or observed  Perception WDL  Hallucination None reported or observed  Judgment Poor  Confusion None  Danger to Self  Current suicidal ideation? Passive  Self-Injurious Behavior No self-injurious ideation or behavior indicators observed or expressed   Agreement Not to Harm Self Yes  Description of Agreement Verbal  Danger to Others  Danger to Others None reported or observed

## 2023-06-25 NOTE — Progress Notes (Signed)
 [facility] MD Progress Note  06/25/2023 2:04 PM Sean Alexander  MRN:  [id number] Subjective:   63 year old Caucasian male, married, employed, lives with his family.  Background history of MDD recurrent.  Presented in company of his friend on account of worsening depression associated with suicidal thoughts.  Reported to have been going to the park with his gun instead of going to his job.  Wife's physical health issue is major stressor for him. Routine labs were essentially normal.  No detectable alcohol.  UDS positive for benzodiazepine which is prescribed.  Chart reviewed today.  Patient discussed at multidisciplinary team meeting.  Staff reports that patient has been participating with unit groups and therapeutic activities.  He slept for 7.2 hours.  He has been pleasant and engages well with the milieu.  He endorses passive SI.  No intent on acting.  He has been adherent with his medication.  Seen today.  Doing well with his medication regimen.  Glad that is coming off alprazolam  as he never realized it can cause cognitive dulling.  We have agreed to discontinue clonazepam  in anticipation of discharge over the weekend.  There are no new psychosocial stressors.  His family remains supportive.  No manic symptoms.  No psychotic symptoms.  No rageful thoughts towards self or towards others.  No adverse effects from his medicine. Encouraged to keep ventilating his feelings to staff.   Principal Problem: Major depressive disorder, recurrent severe without psychotic features (HCC) Diagnosis: Principal Problem:   Major depressive disorder, recurrent severe without psychotic features (HCC) Active Problems:   Generalized anxiety disorder   Chronic insomnia   Suicidal ideation   Suicide attempt by firearm [facility])  Total Time spent with patient: 20 minutes  Past Psychiatric History:  See H&P.  Past Medical History:  Past Medical History:  Diagnosis Date   Anxiety 01/17/2014   Chronic insomnia  06/16/2023   Evaluate for Obstructive sleep apnea 01/17/2014   GAD (generalized anxiety disorder) 06/16/2023   H/O psychiatric hospitalization 06/16/2023   Hepatitis A virus infection    Hypersomnolence 01/17/2014   Kidney Batalla 2017   Major depressive disorder, recurrent severe without psychotic features (HCC) 06/16/2023   OSA on CPAP 11/23/2014   RUQ pain 09/02/2016   Sleep apnea    uses CPAP   Suicide attempt by firearm (HCC) 06/16/2023   aborted   Unilateral primary osteoarthritis, left knee 11/18/2020    Past Surgical History:  Procedure Laterality Date   CHOLECYSTECTOMY N/A 09/02/2016   Procedure: LAPAROSCOPIC CHOLECYSTECTOMY WITH INTRAOPERATIVE CHOLANGIOGRAM;  Surgeon: Adalberto Hollow, MD;  Location: [facility] ORS;  Service: General;  Laterality: N/A;   HERNIA REPAIR  1986   Family History:  Family History  Problem Relation Age of Onset   Heart disease Mother    Heart disease Father    Diabetes Brother    Colon cancer Neg Hx    Colon polyps Neg Hx    Family Psychiatric  History:  See H&P.  Social History:  Social History   Substance and Sexual Activity  Alcohol Use No   Alcohol/week: 0.0 standard drinks of alcohol     Social History   Substance and Sexual Activity  Drug Use No    Social History   Socioeconomic History   Marital status: Married    Spouse name: Rice Chamorro   Number of children: 0   Years of education: Not on file   Highest education level: Not on file  Occupational History   Occupation: Psychiatrist  Tobacco Use   Smoking status: Never   Smokeless tobacco: Never  Vaping Use   Vaping status: Never Used  Substance and Sexual Activity   Alcohol use: No    Alcohol/week: 0.0 standard drinks of alcohol   Drug use: No   Sexual activity: Not Currently  Other Topics Concern   Not on file  Social History Narrative   Not on file   Social Drivers of Health   Financial Resource Strain: Not on file  Food Insecurity: No Food  Insecurity (06/15/2023)   Hunger Vital Sign    Worried About Running Out of Food in the Last Year: Never true    Ran Out of Food in the Last Year: Never true  Transportation Needs: No Transportation Needs (06/15/2023)   PRAPARE - Administrator, Civil Service (Medical): No    Lack of Transportation (Non-Medical): No  Physical Activity: Not on file  Stress: Not on file  Social Connections: Not on file    Current Medications: Current Facility-Administered Medications  Medication Dose Route Frequency Provider Last Rate Last Admin   cholecalciferol  (VITAMIN D3) 25 MCG (1000 UNIT) tablet 2,000 Units  2,000 Units Oral Daily Timmothy Foots, MD   2,000 Units at 06/25/23 1610   clonazePAM  (KLONOPIN ) disintegrating tablet 0.25 mg  0.25 mg Oral QHS Izell Labat, Iline Mallory, MD   0.25 mg at 06/24/23 2116   DULoxetine (CYMBALTA) DR capsule 60 mg  60 mg Oral Daily Bailie Christenbury, Iline Mallory, MD   60 mg at 06/25/23 9604   gabapentin  (NEURONTIN ) capsule 300 mg  300 mg Oral QHS Atia Haupt, Iline Mallory, MD   300 mg at 06/24/23 2117   hydrOXYzine  (ATARAX ) tablet 25 mg  25 mg Oral TID PRN Jhonny Moss, MD   25 mg at 06/21/23 2126   magnesium  hydroxide (MILK OF MAGNESIA) suspension 30 mL  30 mL Oral Daily PRN Dorthea Gauze, NP       mirtazapine  (REMERON ) tablet 30 mg  30 mg Oral QHS Richie Bonanno A, MD   30 mg at 06/24/23 2117   OLANZapine  (ZYPREXA ) injection 10 mg  10 mg Intramuscular TID PRN Dorthea Gauze, NP       OLANZapine  (ZYPREXA ) injection 5 mg  5 mg Intramuscular TID PRN Dorthea Gauze, NP       OLANZapine  zydis (ZYPREXA ) disintegrating tablet 5 mg  5 mg Oral TID PRN Dorthea Gauze, NP       traZODone  (DESYREL ) tablet 50 mg  50 mg Oral QHS Parker, Alvin S, MD   50 mg at 06/24/23 2116    Lab Results: No results found for this or any previous visit (from the past 48 hours).  Blood Alcohol level:  Lab Results  Component Value Date   [name] <15 06/14/2023   ETH <10 02/05/2022    Metabolic Disorder  Labs: Lab Results  Component Value Date   HGBA1C 5.8 (H) 06/16/2023   MPG 120 06/16/2023   MPG 111.15 09/02/2016   No results found for: PROLACTIN Lab Results  Component Value Date   CHOL 149 05/10/2023   TRIG 201 (H) 05/10/2023   HDL 28 (L) 05/10/2023   CHOLHDL 5.3 (H) 05/10/2023   LDLCALC 87 05/10/2023   LDLCALC 106 (H) 03/27/2022    Physical Findings: AIMS:  ,  ,  ,  ,  ,  ,   CIWA:    COWS:     Musculoskeletal: Strength & Muscle Tone: within normal limits Gait & Station: normal Patient leans:  N/A  Psychiatric Specialty Exam:  Presentation  General Appearance and behavior:  Overweight, watching TV prior to interview, not in any distress, good rapport.  No EPS.  Eye Contact: Good.  Speech: Spontaneous.  Normal rate, tone and volume.  Normal prosody of speech.  Mood and Affect  Mood: Euthymic.  Affect: Mobilizing affect appropriately  Thought Process  Thought Processes: Normal speed of thought.  Linear and goal directed.  Descriptions of Associations:Intact  Orientation:Full (Time, Place and Person)  Thought Content: Future-oriented.  No negative rumination.  No guilty rumination.  No current suicidal thoughts.  No homicidal thoughts.  No thoughts of violence.   No delusional theme.  No obsessions.  Hallucinations: No hallucination in any modality.  Sensorium  Memory: Good.  Judgment: Good.  Insight: Good  Executive Functions  Concentration: Good.  Attention Span: Good.  Recall: Good.  Fund of Knowledge: Good.  Language: Good   Psychomotor Activity  Normal psychomotor activity    Physical Exam: Physical Exam ROS Blood pressure 127/89, pulse 63, temperature 98.1 F (36.7 C), temperature source Oral, resp. rate 20, height 5' 10 (1.778 m), weight 99.3 kg, SpO2 96%. Body mass index is 31.42 kg/m.   Treatment Plan Summary: Patient is doing well on his regimen.  We will discontinue clonazepam  tonight.  We will keep  his other medications the same and evaluate him further.  Scheduled for discharge this weekend if he maintains stability.    1.  Duloxetine 60 mg daily from tomorrow. 2.  Mirtazapine  30 mg at bedtime. 3.  Continue trazodone  50 mg at bedtime as needed. 4.  Discontinue clonazepam  to 0.25 mg daily. 5.  Gabapentin  300 mg at bedtime. 6.  Continue to encourage unit groups and therapeutic activities. 7.  Continue to monitor mood behavior and interaction with others. 8.  Social worker will coordinate discharge and aftercare planning.  Amelie Jury, MD 06/25/2023, 2:04 PM

## 2023-06-25 NOTE — Plan of Care (Signed)
   Problem: Activity: Goal: Interest or engagement in activities will improve Outcome: Progressing   Problem: Coping: Goal: Ability to verbalize frustrations and anger appropriately will improve Outcome: Progressing

## 2023-06-25 NOTE — Group Note (Signed)
 Date:  06/25/2023 Time:  11:52 AM  Group Topic/Focus:  Goals Group:   The focus of this group is to help patients establish daily goals to achieve during treatment and discuss how the patient can incorporate goal setting into their daily lives to aide in recovery.    Participation Level:  Active  Participation Quality:  Appropriate and Attentive  Affect:  Appropriate  Cognitive:  Alert and Appropriate  Insight: Appropriate and Good  Engagement in Group:  Engaged  Modes of Intervention:  Discussion  Additional Comments:  Pt state his goal is to stay prayed up and to stay positive.  Tita Form 06/25/2023, 11:52 AM

## 2023-06-25 NOTE — Group Note (Signed)
 Recreation Therapy Group Note   Group Topic:Team Building  Group Date: 06/25/2023 Start Time: 0935 End Time: 1005 Facilitators: Evangelynn Lochridge-McCall, LRT,CTRS Location: [address] Dayroom   Group Topic: Communication, Team Building, Problem Solving  Goal Area(s) Addresses:  Patient will effectively work with peer towards shared goal.  Patient will identify skills used to make activity successful.  Patient will identify how skills used during activity can be applied to reach post d/c goals.   Behavioral Response: Engaged  Intervention: STEM Activity- Glass blower/designer  Activity: Tallest [employer]. In teams of 5-6, patients were given 11 craft pipe cleaners. Using the materials provided, patients were instructed to compete again the opposing team(s) to build the tallest free-standing structure from floor level. The activity was timed; difficulty increased by Clinical research associate as Production designer, theatre/television/film continued.  Systematically resources were removed with additional directions for example, placing one arm behind their back, working in silence, and shape stipulations. LRT facilitated post-activity discussion reviewing team processes and necessary communication skills involved in completion. Patients were encouraged to reflect how the skills utilized, or not utilized, in this activity can be incorporated to positively impact support systems post discharge.  Education: Pharmacist, community, Scientist, physiological, Discharge Planning   Education Outcome: Acknowledges education/In group clarification offered/Needs additional education.    Affect/Mood: Appropriate   Participation Level: Engaged   Participation Quality: Independent   Behavior: Appropriate   Speech/Thought Process: Focused   Insight: Good   Judgement: Good   Modes of Intervention: STEM Activity   Patient Response to Interventions:  Engaged   Education Outcome:  In group clarification offered    Clinical  Observations/Individualized Feedback: Pt attended and participated in group session.    Plan: Continue to engage patient in RT group sessions 2-3x/week.   Marilynne Dupuis-McCall, LRT,CTRS 06/25/2023 12:28 PM

## 2023-06-25 NOTE — Progress Notes (Signed)
Pt attended AA group 

## 2023-06-26 NOTE — Group Note (Signed)
 Date:  06/26/2023 Time:  9:26 PM  Group Topic/Focus:  Wrap-Up Group:   The focus of this group is to help patients review their daily goal of treatment and discuss progress on daily workbooks.    Participation Level:  Active  Participation Quality:  Appropriate  Affect:  Appropriate  Cognitive:  Appropriate  Insight: Appropriate  Engagement in Group:  Engaged  Modes of Intervention:  Activity, Discussion, and Support  Additional Comments:  Pt rates day a 5/10. Pt states goal today was to talk to doctor about discharge plan. Pt states achieving this goal. Pt states coping skills are praying, listening to music, and walking. Pt has no concerns.  Sean Alexander Claudene 06/26/2023, 9:26 PM

## 2023-06-26 NOTE — Progress Notes (Signed)
   06/26/23 1300  Psych Admission Type (Psych Patients Only)  Admission Status Involuntary  Psychosocial Assessment  Patient Complaints Anxiety;Depression  Eye Contact Fair  Facial Expression Anxious  Affect Anxious  Speech Logical/coherent  Interaction Assertive  Motor Activity Other (Comment) (steady gait)  Appearance/Hygiene Unremarkable  Behavior Characteristics Cooperative;Appropriate to situation  Mood Depressed;Anxious  Thought Process  Coherency WDL  Content WDL  Delusions None reported or observed  Perception WDL  Hallucination None reported or observed  Judgment Poor  Confusion None  Danger to Self  Current suicidal ideation? Passive  Self-Injurious Behavior No self-injurious ideation or behavior indicators observed or expressed   Danger to Others  Danger to Others None reported or observed

## 2023-06-26 NOTE — Progress Notes (Signed)
   06/25/23 2052  Psych Admission Type (Psych Patients Only)  Admission Status Involuntary  Psychosocial Assessment  Patient Complaints Anxiety;Depression  Eye Contact Fair  Facial Expression Anxious  Affect Anxious;Apprehensive  Speech Logical/coherent  Interaction Assertive  Motor Activity Other (Comment) (WDL)  Appearance/Hygiene Unremarkable  Behavior Characteristics Cooperative;Appropriate to situation  Mood Depressed;Anxious  Thought Process  Coherency WDL  Content WDL  Delusions None reported or observed  Perception WDL  Hallucination None reported or observed  Judgment Poor  Confusion None  Danger to Self  Current suicidal ideation? Passive  Self-Injurious Behavior No self-injurious ideation or behavior indicators observed or expressed   Agreement Not to Harm Self Yes  Description of Agreement verbal  Danger to Others  Danger to Others None reported or observed

## 2023-06-26 NOTE — Plan of Care (Signed)
   Problem: Education: Goal: Verbalization of understanding the information provided will improve Outcome: Progressing   Problem: Activity: Goal: Interest or engagement in activities will improve Outcome: Progressing

## 2023-06-26 NOTE — Group Note (Signed)
 LCSW Group Therapy Note 06/26/2023 Type of Therapy and Topic:  Group Therapy: Gratitude Participation Level:  Active Description of Group:   In this group, patients shared and discussed a positive experience that had beneficial impact on their life and those around them.  The group discussed how bringing the positive elements of their lives to the forefront of their minds can help with recovery from a physical or mental illness.  An exercise was done as a group to state mindfulness routines in order to reduce stress or anxiety level encourage participants to consider other potential positives in their lives.  Therapeutic Goals: Patients will participate by sharing a positive or impactful event that assisted to increase self-awareness and self-esteem.  Patients will discuss how to apply mindfulness technique with any situations. Patients will explore other coping skills to utilize during emotional dysregulation to assist with their daily tasks.   Summary of Patient Progress:  The patient did not share but was very active in responding to in group .  Patient's reaction to the group was good. Therapeutic Modalities:   Solution-Focused Therapy Activity  Sean Alexander O Sean Alexander, LCSWA 06/26/2023  1:48 PM

## 2023-06-26 NOTE — Progress Notes (Signed)
 Adult Psychoeducational Group Note  Date:  06/26/2023 Time:  9:48 AM  Group Topic/Focus:  Goals Group:   The focus of this group is to help patients establish daily goals to achieve during treatment and discuss how the patient can incorporate goal setting into their daily lives to aide in recovery.  Participation Level:  Active  Participation Quality:  Appropriate  Affect:  Appropriate  Cognitive:  Appropriate  Insight: Appropriate  Engagement in Group:  Engaged  Modes of Intervention:  Discussion  Additional Comments:  Pt stated he is feeling anxious.  Pt goal for the day is to talk with a doctor.  Daine Pillar D 06/26/2023, 9:48 AM

## 2023-06-26 NOTE — Progress Notes (Signed)
 [facility] MD Progress Note  06/26/2023 11:00 AM Sean Alexander  MRN:  [id number] Subjective:   63 year old Caucasian male, married, employed, lives with his family.  Background history of MDD recurrent.  Presented in company of his friend on account of worsening depression associated with suicidal thoughts.  Reported to have been going to the park with his gun instead of going to his job.  Wife's physical health issue is major stressor for him. Routine labs were essentially normal.  No detectable alcohol.  UDS positive for benzodiazepine which is prescribed.  Chart reviewed today.  Patient discussed at multidisciplinary team meeting.  Nursing staff reports that patient slept for 7.5 hours.  He has been taking his medicines as recommended.  No PRNs required.  He has been participating with unit groups and therapeutic activities.  No response to internal stimuli.  Seen today.  Patient states that he is not ready to go back to work.  He wants to do IOP in the community.  He is tolerating his medicines without any adverse effects.  He has been reading a novel on the unit.  He reports good concentration and attention.  No negative ruminative flooding.  No suicidal ideation.  No homicidal thoughts.  No thoughts of violence.  No manic features.  No psychotic features.  His wife will be picking him up tomorrow.   Principal Problem: Major depressive disorder, recurrent severe without psychotic features (HCC) Diagnosis: Principal Problem:   Major depressive disorder, recurrent severe without psychotic features (HCC) Active Problems:   Generalized anxiety disorder   Chronic insomnia   Suicidal ideation   Suicide attempt by firearm [facility])  Total Time spent with patient: 20 minutes  Past Psychiatric History:  See H&P.  Past Medical History:  Past Medical History:  Diagnosis Date   Anxiety 01/17/2014   Chronic insomnia 06/16/2023   Evaluate for Obstructive sleep apnea 01/17/2014   GAD (generalized anxiety  disorder) 06/16/2023   H/O psychiatric hospitalization 06/16/2023   Hepatitis A virus infection    Hypersomnolence 01/17/2014   Kidney Tygart 2017   Major depressive disorder, recurrent severe without psychotic features (HCC) 06/16/2023   OSA on CPAP 11/23/2014   RUQ pain 09/02/2016   Sleep apnea    uses CPAP   Suicide attempt by firearm (HCC) 06/16/2023   aborted   Unilateral primary osteoarthritis, left knee 11/18/2020    Past Surgical History:  Procedure Laterality Date   CHOLECYSTECTOMY N/A 09/02/2016   Procedure: LAPAROSCOPIC CHOLECYSTECTOMY WITH INTRAOPERATIVE CHOLANGIOGRAM;  Surgeon: Lily Boas, MD;  Location: [facility];  Service: General;  Laterality: N/A;   HERNIA REPAIR  1986   Family History:  Family History  Problem Relation Age of Onset   Heart disease Mother    Heart disease Father    Diabetes Brother    Colon cancer Neg Hx    Colon polyps Neg Hx    Family Psychiatric  History:  See H&P.  Social History:  Social History   Substance and Sexual Activity  Alcohol Use No   Alcohol/week: 0.0 standard drinks of alcohol     Social History   Substance and Sexual Activity  Drug Use No    Social History   Socioeconomic History   Marital status: Married    Spouse name: Sean Alexander   Number of children: 0   Years of education: Not on file   Highest education level: Not on file  Occupational History   Occupation: Psychiatrist  Tobacco Use   Smoking  status: Never   Smokeless tobacco: Never  Vaping Use   Vaping status: Never Used  Substance and Sexual Activity   Alcohol use: No    Alcohol/week: 0.0 standard drinks of alcohol   Drug use: No   Sexual activity: Not Currently  Other Topics Concern   Not on file  Social History Narrative   Not on file   Social Drivers of Health   Financial Resource Strain: Not on file  Food Insecurity: No Food Insecurity (06/15/2023)   Hunger Vital Sign    Worried About Running Out of Food in the  Last Year: Never true    Ran Out of Food in the Last Year: Never true  Transportation Needs: No Transportation Needs (06/15/2023)   PRAPARE - Administrator, Civil Service (Medical): No    Lack of Transportation (Non-Medical): No  Physical Activity: Not on file  Stress: Not on file  Social Connections: Not on file    Current Medications: Current Facility-Administered Medications  Medication Dose Route Frequency Provider Last Rate Last Admin   cholecalciferol  (VITAMIN D3) 25 MCG (1000 UNIT) tablet 2,000 Units  2,000 Units Oral Daily Kennyth Starleen RAMAN, MD   2,000 Units at 06/26/23 9185   DULoxetine  (CYMBALTA ) DR capsule 60 mg  60 mg Oral Daily Adileny Delon, Jerrell LABOR, MD   60 mg at 06/26/23 0813   gabapentin  (NEURONTIN ) capsule 300 mg  300 mg Oral QHS Taletha Twiford, Jerrell LABOR, MD   300 mg at 06/25/23 2132   hydrOXYzine  (ATARAX ) tablet 25 mg  25 mg Oral TID PRN Prentis Oliva LABOR, MD   25 mg at 06/25/23 2132   magnesium  hydroxide (MILK OF MAGNESIA) suspension 30 mL  30 mL Oral Daily PRN Trudy Carwin, NP       mirtazapine  (REMERON ) tablet 30 mg  30 mg Oral QHS Arcelia Pals, Jerrell LABOR, MD   30 mg at 06/25/23 2132   OLANZapine  (ZYPREXA ) injection 10 mg  10 mg Intramuscular TID PRN Trudy Carwin, NP       OLANZapine  (ZYPREXA ) injection 5 mg  5 mg Intramuscular TID PRN Trudy Carwin, NP       OLANZapine  zydis (ZYPREXA ) disintegrating tablet 5 mg  5 mg Oral TID PRN Trudy Carwin, NP       traZODone  (DESYREL ) tablet 50 mg  50 mg Oral QHS Parker, Alvin S, MD   50 mg at 06/25/23 2132    Lab Results: No results found for this or any previous visit (from the past 48 hours).  Blood Alcohol level:  Lab Results  Component Value Date   [facility] <15 06/14/2023   ETH <10 02/05/2022    Metabolic Disorder Labs: Lab Results  Component Value Date   HGBA1C 5.8 (H) 06/16/2023   MPG 120 06/16/2023   MPG 111.15 09/02/2016   No results found for: PROLACTIN Lab Results  Component Value Date   CHOL 149  05/10/2023   TRIG 201 (H) 05/10/2023   HDL 28 (L) 05/10/2023   CHOLHDL 5.3 (H) 05/10/2023   LDLCALC 87 05/10/2023   LDLCALC 106 (H) 03/27/2022    Physical Findings: AIMS:  ,  ,  ,  ,  ,  ,   CIWA:    COWS:     Musculoskeletal: Strength & Muscle Tone: within normal limits Gait & Station: normal Patient leans: N/A  Psychiatric Specialty Exam:  Presentation  General Appearance and behavior:  Overweight, not in any distress, engage politely.  No EPS.  Eye Contact: Good.  Speech:  Spontaneous.  Normal rate, tone and volume.  Normal prosody of speech.  Mood and Affect  Mood: Euthymic.  Affect: Mobilizing affect appropriately  Thought Process  Thought Processes: Normal speed of thought.  Linear and goal directed.  Descriptions of Associations:Intact  Orientation:Full (Time, Place and Person)  Thought Content: Future-oriented.  No negative rumination.  No guilty rumination.  No current suicidal thoughts.  No homicidal thoughts.  No thoughts of violence.   No delusional theme.  No obsessions.  Hallucinations: No hallucination in any modality.  Sensorium  Memory: Good.  Judgment: Good.  Insight: Good  Executive Functions  Concentration: Good.  Attention Span: Good.  Recall: Good.  Fund of Knowledge: Good.  Language: Good   Psychomotor Activity  Normal psychomotor activity    Physical Exam: Physical Exam ROS Blood pressure (!) 145/85, pulse 70, temperature 97.9 F (36.6 C), temperature source Oral, resp. rate 18, height 5' 10 (1.778 m), weight 99.3 kg, SpO2 96%. Body mass index is 31.42 kg/m.   Treatment Plan Summary: Patient is stable on his regimen.  He is doing well off clonazepam .  No dangerousness.  We will finalizing aftercare.  Scheduled for discharge tomorrow.  1.  Duloxetine  60 mg daily.  2.  Mirtazapine  30 mg at bedtime. 3.  Continue trazodone  50 mg at bedtime as needed. 4. Gabapentin  300 mg at bedtime. 5.  Continue to  encourage unit groups and therapeutic activities. 6.  Continue to monitor mood behavior and interaction with others. 7.  Social worker will coordinate discharge and aftercare planning.  Jerrell DELENA Forehand, MD 06/26/2023, 11:00 AM

## 2023-06-27 MED ORDER — MIRTAZAPINE 30 MG PO TABS: 30.0000 mg | ORAL_TABLET | Freq: Every day | ORAL | 0 refills | Status: AC

## 2023-06-27 MED ORDER — GABAPENTIN 300 MG PO CAPS: 300.0000 mg | ORAL_CAPSULE | Freq: Every day | ORAL | 0 refills | Status: AC

## 2023-06-27 MED ORDER — DULOXETINE HCL 60 MG PO CPEP: 60.0000 mg | ORAL_CAPSULE | Freq: Every day | ORAL | 0 refills | Status: AC

## 2023-06-27 MED ORDER — TRAZODONE HCL 50 MG PO TABS: 50.0000 mg | ORAL_TABLET | Freq: Every day | ORAL | 0 refills | Status: AC

## 2023-06-27 NOTE — Progress Notes (Signed)
   06/27/23 1200  Psych Admission Type (Psych Patients Only)  Admission Status Involuntary  Psychosocial Assessment  Patient Complaints Anxiety;Depression  Eye Contact Fair  Facial Expression Anxious  Affect Anxious  Speech Logical/coherent  Interaction Assertive  Motor Activity Other (Comment) (steady gait)  Appearance/Hygiene Unremarkable  Behavior Characteristics Cooperative;Appropriate to situation  Mood Depressed;Anxious  Thought Process  Coherency WDL  Content WDL  Delusions None reported or observed  Perception WDL  Hallucination None reported or observed  Judgment Poor  Confusion None  Danger to Self  Current suicidal ideation? Passive  Agreement Not to Harm Self Yes  Description of Agreement agreed to contact staff before acting on harmful thoughts  Danger to Others  Danger to Others None reported or observed

## 2023-06-27 NOTE — Progress Notes (Addendum)
 Confirmed from patient's provider that he is cleared for discharge home today. Patient's spouse contacted, she will pick him up today to be transported home.  3:22pm Patient and spouse provided with contact information for the Intensive Outpatient Program through [facility]. Patient to call on Monday, 06/28/23 to complete referral.  Keanthony Poole, LCSW Transition of Care

## 2023-06-27 NOTE — Progress Notes (Signed)
 Pt discharged to lobby. Pt was stable and appreciative at that time. All papers and electronic prescriptions were given and valuables returned.Follow up instructions given Verbal understanding expressed. Pt given opportunity to express concerns and ask questions.

## 2023-06-27 NOTE — Progress Notes (Signed)
 Adult Psychoeducational Group Note  Date:  06/27/2023 Time:  10:01 AM  Group Topic/Focus:  Goals Group:   The focus of this group is to help patients establish daily goals to achieve during treatment and discuss how the patient can incorporate goal setting into their daily lives to aide in recovery.  Participation Level:  Active  Participation Quality:  Appropriate  Affect:  Appropriate  Cognitive:  Appropriate  Insight: Appropriate  Engagement in Group:  Engaged  Modes of Intervention:  Discussion  Additional Comments:  Pt stated he is not doing good.  Pt does not have a goal for the day.  Daine Pillar D 06/27/2023, 10:01 AM

## 2023-06-27 NOTE — Progress Notes (Signed)
  [facility] Adult Case Management Discharge Plan :  Will you be returning to the same living situation after discharge:  Yes,  patient to return home with spouse At discharge, do you have transportation home?: Yes,  patient's spouse will provide transportation home Do you have the ability to pay for your medications: Yes,  patient has  private insurance.  Release of information consent forms completed and in the chart;  Patient's signature needed at discharge.  Patient to Follow up at:  Follow-up Information     [facility]. Go on 07/27/2023.   Specialty: Behavioral Health Why: You have an appointment for medication management services on 07/27/23 at 4:00 pm, in person. You are on a cancellation list for a sooner appointment.  Please call as soon as possible to personally schedule an appointment for therapy services. Contact information: [address] [place]  [address] [phone]        [facility]al Follow up.   Why: TOC to contact [facility] Intensive [facility] Program to schedule an appointment for an assessment on 06/28/23 Contact information: [address] [place] [address] [phone]                 Next level of care provider has access to [facility] Link:no  Safety Planning and Suicide Prevention discussed: Yes,  with patient's spouse     Has patient been referred to the Quitline?: Patient does not use tobacco/nicotine products  Patient has been referred for addiction treatment: No known substance use disorder.  [address], [place], [place] 06/27/2023, 2:54 PM

## 2023-06-27 NOTE — BHH Suicide Risk Assessment (Signed)
 [facility] Discharge Suicide Risk Assessment   Principal Problem: Major depressive disorder, recurrent severe without psychotic features [facility]) Discharge Diagnoses: Principal Problem:   Major depressive disorder, recurrent severe without psychotic features (HCC) Active Problems:   Generalized anxiety disorder   Chronic insomnia   Suicidal ideation   Suicide attempt by firearm [facility])   Total Time spent with patient: 30 minutes  Musculoskeletal: Strength & Muscle Tone: within normal limits Gait & Station: normal Patient leans: N/A  Psychiatric Specialty Exam  Presentation  General Appearance:  Casually dressed, not in any distress, appropriate behavior, engaged politely.  No EPS.  Eye Contact: Good.  Speech: Spontaneous.  Normal rate, tone and volume.  Normal prosody of speech.  Mood and Affect  Mood: Euthymic.  Affect: Full range and appropriate.  Thought Process  Thought Processes: Linear and goal directed.  Descriptions of Associations:Intact  Orientation:Full (Time, Place and Person)  Thought Content: Future oriented.  No current suicidal thoughts.  No homicidal thoughts.  No thoughts of violence.  No negative ruminative flooding.  No guilty ruminations.  No delusional theme.  No obsessions.  Hallucinations: No hallucination in any modality.  Sensorium  Memory: Good.  Judgment: Good.  Insight: Good  Executive Functions  Concentration: Good.  Attention Span: Good.  Recall: Good.  Fund of Knowledge: Good.  Language: Good   Psychomotor Activity  Normal psychomotor activity   Physical Exam: Physical Exam ROS Blood pressure (!) 147/85, pulse 63, temperature 97.7 F (36.5 C), temperature source Oral, resp. rate 18, height 5' 10 (1.778 m), weight 99.3 kg, SpO2 98%. Body mass index is 31.42 kg/m.  Mental Status Per Nursing Assessment::   On Admission:  Suicidal ideation indicated by patient, Suicidal ideation indicated by others, Suicide  plan  Demographic Factors:  Male and Caucasian  Loss Factors: NA  Historical Factors: Family history of mental illness or substance abuse  Risk Reduction Factors:   Sense of responsibility to family, Employed, Living with another person, especially a relative, Positive social support, Positive therapeutic relationship, and Positive coping skills or problem solving skills  Continued Clinical Symptoms:  Patient is no longer pervasively down.  He has responded well to psychotropic medications.  No overwhelming anxiety.  No manic features.  No psychotic features.  Sleep-wake cycle is well-regulated.  Cognitive Features That Contribute To Risk:  None    Suicide Risk:  Minimal: No current suicidal thoughts.  No homicidal thoughts.  No thoughts of violence.  Modifiable risk factor targeted during this admission as depression and anxiety.  Patient was also weaned off benzodiazepines.  There are no new psychosocial stressors.  His family remains supportive. At this point in time, patient is stable for care at a lower setting.  Follow-up Information     [facility]. Go on 07/27/2023.   Specialty: Behavioral Health Why: You have an appointment for medication management services on 07/27/23 at 4:00 pm, in person. You are on a cancellation list for a sooner appointment.  Please call as soon as possible to personally schedule an appointment for therapy services. Contact information: [address], [address] [place]  [address] [phone]        [facility] Follow up.   Why: Please call [facility] Intensive Outpatient Program to schedule an appointment for an assessment Contact information: [address] [place] [address] [phone]  Plan Of Care/Follow-up recommendations:  See discharge summary  Jerrell DELENA Forehand,  MD 06/27/2023, 9:53 AM

## 2023-06-27 NOTE — Discharge Summary (Signed)
 Physician Discharge Summary Note  Patient:  Sean Alexander is an 63 y.o., male MRN:  [id number] DOB:  16-Aug-1960 Patient phone:  [phone] (home)  Patient address:   [address] [place] [address],  Total Time spent with patient: 30 minutes  Date of Admission:  06/15/2023 Date of Discharge: 06/27/2023  Reason for Admission:     Principal Problem: Major depressive disorder, recurrent severe without psychotic features [facility]) Discharge Diagnoses: Principal Problem:   Major depressive disorder, recurrent severe without psychotic features (HCC) Active Problems:   Generalized anxiety disorder   Chronic insomnia   Suicidal ideation   Suicide attempt by firearm [facility])   Past Psychiatric History:   Past Medical History:  Past Medical History:  Diagnosis Date   Anxiety 01/17/2014   Chronic insomnia 06/16/2023   Evaluate for Obstructive sleep apnea 01/17/2014   GAD (generalized anxiety disorder) 06/16/2023   H/O psychiatric hospitalization 06/16/2023   Hepatitis A virus infection    Hypersomnolence 01/17/2014   Kid[facility] [place] 2017   Major depressive disorder, recurrent severe without psychotic features (HCC) 06/16/2023   OSA on CPAP 11/23/2014   RUQ pain 09/02/2016   Sleep apnea    uses CPAP   Suicide attempt by firearm (HCC) 06/16/2023   aborted   Unilateral primary osteoarthritis, left knee 11/18/2020    Past Surgical History:  Procedure Laterality Date   CHOLECYSTECTOMY N/A 09/02/2016   Procedure: LAPAROSCOPIC CHOLECYSTECTOMY WITH INTRAOPERATIVE CHOLANGIOGRAM;  Surgeon: Lily Boas, MD;  Location: [facility];  Service: General;  Laterality: N/A;   HERNIA REPAIR  1986   Family History:  Family History  Problem Relation Age of Onset   Heart disease Mother    Heart disease Father    Diabetes Brother    Colon cancer Neg Hx    Colon polyps Neg Hx    Family Psychiatric  History:  Social History:  Social History   Substance and Sexual Activity  Alcohol Use No    Alcohol/week: 0.0 standard drinks of alcohol     Social History   Substance and Sexual Activity  Drug Use No    Social History   Socioeconomic History   Marital status: Married    Spouse name: [name]   Number of children: 0   Years of education: Not on file   Highest education level: Not on file  Occupational History   Occupation: Psychiatrist  Tobacco Use   Smoking status: Never   Smokeless tobacco: Never  Vaping Use   Vaping status: Never Used  Substance and Sexual Activity   Alcohol use: No    Alcohol/week: 0.0 standard drinks of alcohol   Drug use: No   Sexual activity: Not Currently  Other Topics Concern   Not on file  Social History Narrative   Not on file   Social Drivers of Health   Financial Resource Strain: Not on file  Food Insecurity: No Food Insecurity (06/15/2023)   Hunger Vital Sign    Worried About Running Out of Food in the Last Year: Never true    Ran Out of Food in the Last Year: Never true  Transportation Needs: No Transportation Needs (06/15/2023)   PRAPARE - Administrator, Civil Service (Medical): No    Lack of Transportation (Non-Medical): No  Physical Activity: Not on file  Stress: Not on file  Social Connections: Not on file    Hospital Course:   Patient was admitted on suicide precautions. He was switched from alprazolam  to  Clonazepam . We tapered him off benzodiazepines during his hospital stay. Mirtazapine  and Duloxetine  was initiated to address underlying mood disorder. Patient tolerated the medications well and responded well to treatment. He participated with unit groups and therapeutic activities. He maintained a normal sleep wake cycle. He attended to his basic needs. He did not require any psychiatric or medical emergency measures during his hospital stay.  At interview today, patient is apprehensive of going back to his job. States that he wants to start an intensive outpatient program as he does  not feel ready to get back to his job. He gave me FMLA paperwork to fill out for him.   Collateral from his wife indicates that he has expressed hopelessness and worthlessness chronically over the years. Patient's wife states that she has become immune to his threats of self harm as he tends to use it repeatedly. Wife states that he has not been truthful with his providers in the community. He had been attending [facility] since 2017. Wife states that he did well with IOP in the past.  She is glad that it is part of his current discharge planning. Patient wife states that she has been in communication with him and he is looking a lot better. Patient's wife would pick him up today.  Nursing staff reports that he has regressed since discharge was discussed. He feels that he is not ready. He wants IOP appointment to be confirmed.   SW will make IOP appointment tomorrow and send him the details.  Patient has dependent personality trait. He has completely detoxed from benzodiazepines. He is on combination of antidepressant/antianxiety medications. He is stable for care at a lower setting.  Physical Findings: AIMS:  , ,  ,  ,  ,  ,   CIWA:    COWS:     Musculoskeletal: Strength & Muscle Tone: within normal limits Gait & Station: normal Patient leans: N/A   Psychiatric Specialty Exam:  Presentation  General Appearance:  Casually dressed, not in any distress, appropriate behavior, engaged politely.  No EPS.  Eye Contact: Good.  Speech: Spontaneous.  Normal rate, tone and volume.  Normal prosody of speech.  Mood and Affect  Mood: Euthymic.  Affect: Full range and appropriate.  Thought Process  Thought Processes: Linear and goal directed.  Descriptions of Associations:Intact  Orientation:Full (Time, Place and Person)  Thought Content: Future oriented. No current suicidal thoughts.  No homicidal thoughts.  No thoughts of violence.  No negative ruminative flooding.  No guilty  ruminations.  No delusional theme.  No obsessions.  Hallucinations: No hallucination in any modality.  Sensorium  Memory: Good.  Judgment: Good.  Insight: Good  Executive Functions  Concentration: Good.  Attention Span: Good.  Recall: Good.  Fund of Knowledge: Good.  Language: Good   Psychomotor Activity  Normal psychomotor activity    Physical Exam: Physical Exam ROS Blood pressure (!) 147/85, pulse 63, temperature 97.7 F (36.5 C), temperature source Oral, resp. rate 18, height 5' 10 (1.778 m), weight 99.3 kg, SpO2 98%. Body mass index is 31.42 kg/m.   Social History   Tobacco Use  Smoking Status Never  Smokeless Tobacco Never   Tobacco Cessation:  N/A, patient does not currently use tobacco products   Blood Alcohol level:  Lab Results  Component Value Date   [name] <15 06/14/2023   ETH <10 02/05/2022    Metabolic Disorder Labs:  Lab Results  Component Value Date   HGBA1C 5.8 (H) 06/16/2023   MPG  120 06/16/2023   MPG 111.15 09/02/2016   No results found for: PROLACTIN Lab Results  Component Value Date   CHOL 149 05/10/2023   TRIG 201 (H) 05/10/2023   HDL 28 (L) 05/10/2023   CHOLHDL 5.3 (H) 05/10/2023   LDLCALC 87 05/10/2023   LDLCALC 106 (H) 03/27/2022    See Psychiatric Specialty Exam and Suicide Risk Assessment completed by Attending Physician prior to discharge.  Discharge destination:  Home  Is patient on multiple antipsychotic therapies at discharge:  No   Has Patient had three or more failed trials of antipsychotic monotherapy by history:  No  Recommended Plan for Multiple Antipsychotic Therapies: NA  Discharge Instructions     Diet - low sodium heart healthy   Complete by: As directed    Increase activity slowly   Complete by: As directed       Allergies as of 06/27/2023       Reactions   Sulfa Antibiotics Anaphylaxis   Rash on neck and sides   Penicillins Rash   Has patient had a PCN reaction causing  immediate rash, facial/tongue/throat swelling, SOB or lightheadedness with hypotension: No Has patient had a PCN reaction causing severe rash involving mucus membranes or skin necrosis: No Has patient had a PCN reaction that required hospitalization: No Has patient had a PCN reaction occurring within the last 10 years: No If all of the above answers are NO, then may proceed with Cephalosporin use.        Medication List     STOP taking these medications    ALPRAZolam  0.5 MG tablet Commonly known as: XANAX    Ascorbic Acid 100 MG Chew   Centrum Adults Tabs   metoprolol  succinate 25 MG 24 hr tablet Commonly known as: Toprol  XL   venlafaxine  XR 150 MG 24 hr capsule Commonly known as: EFFEXOR -XR       TAKE these medications      Indication  DULoxetine  60 MG capsule Commonly known as: CYMBALTA  Take 1 capsule (60 mg total) by mouth daily. Start taking on: June 28, 2023  Indication: Major Depressive Disorder   gabapentin  300 MG capsule Commonly known as: NEURONTIN  Take 1 capsule (300 mg total) by mouth at bedtime.  Indication: Restless Leg Syndrome   mirtazapine  30 MG tablet Commonly known as: REMERON  Take 1 tablet (30 mg total) by mouth at bedtime.  Indication: Major Depressive Disorder   traZODone  50 MG tablet Commonly known as: DESYREL  Take 1 tablet (50 mg total) by mouth at bedtime.  Indication: Trouble Sleeping        Follow-up Information     [facility]. Go on 07/27/2023.   Specialty: Behavioral Health Why: You have an appointment for medication management services on 07/27/23 at 4:00 pm, in person. You are on a cancellation list for a sooner appointment.  Please call as soon as possible to personally schedule an appointment for therapy services. Contact information: [address] [place]  [address] [phone]        [facility] Follow up.    Why: TOC to contact [facility] to schedule an appointment for an assessment on 06/28/23 Contact information: [address] [place] [address] [phone]                 Follow-up recommendations:    Patient will follow up as recommended. Patient will stay on his medications. No  restrictions with respect to diet or exercise.  Signed: Jerrell DELENA Forehand, MD 06/27/2023, 10:44 AM

## 2023-06-28 NOTE — Care Management Note (Signed)
 This patient was admitted on 06/16/23 and was established with [facility] for med mgmt and therapy services.  An appointment was obtained quickly for the patient with this provider.                                         Each day that the patient was admitted, I make a note on my census of patient needs mentioned in progression and otherwise.  There was no mention in progression, secure chat, on the handoff, or from social work that this patient needed intensive outpatient therapy services, nor PHP.  The patient discharged over this past weekend and the doctor then stated that he wanted the patient to have this service.  Since there was never a mention, any mention that IOP was needed, the whole time, no appointment was obtained.  Nothing is open on the weekends, so information was added to the follow up for the patient to call for IOP after discharge.  Patient needs must be given to myself of someone in social work as soon as possible when the patient admits.  At the very least, this must be mentioned by Thursday afternoon because many [facility] providers and other providers are only open on Friday mornings.  If something is needed before the patient can discharge, maybe they could stay over the weekend so an actual appointment can be scheduled on Monday morning, when providers are open for service.

## 2023-06-29 ENCOUNTER — Ambulatory Visit (INDEPENDENT_AMBULATORY_CARE_PROVIDER_SITE_OTHER): Admitting: Licensed Clinical Social Worker

## 2023-06-29 DIAGNOSIS — F332 Major depressive disorder, recurrent severe without psychotic features: Secondary | ICD-10-CM

## 2023-06-29 DIAGNOSIS — F411 Generalized anxiety disorder: Secondary | ICD-10-CM

## 2023-06-29 NOTE — Progress Notes (Signed)
 THERAPIST PROGRESS NOTE  Session Time: 10:10 a.m. to 12:15 p.m.   Type of Therapy: Individual   Therapist Response/Interventions: Other: The therapist conducts a risk assessment and makes [name] aware of the 24/7 BHUC. The therapist encourages Glendia to continue taking his psychotropic medications informing him that it is too early to see results given that he has been on them for 2 weeks at most.   He assists [name] in getting an appointment with Raina Littles to attend their in person MH IOP or PHP. In response to [name]'s worries about if or when he can return to work, the therapist recommends he make no big decisions or life changes while clinically depressed informing him that he will be able to decide on the best course of action once his depression is well-controlled.   Treatment Goals addressed: As Glendia will not be returning to see this therapist for treatment, the therapist defers completing a treatment plan.  [name]'s immediate goal is to experience a decrease in his feelings of depression and anxiety and to eventually make a decision as to what to do about his job which he dislikes immensely.  Summary: Glendia presents today having been discharged 2 days ago from [facility].  He was being seen at [facility] psychiatric since 2017 but admits that he was lying to them and that he stopped his Effexor  about a year ago without telling them as he felt like it was not as effective as before.  He says that the Xanax  did help him to get to sleep so he was primarily going to the appointments simply to get the Xanax  prescription.  He says that he had been on Effexor  for approximately 10 years.   At present, he says that he is coping but not good.  He says that he wanted the hospital to keep him longer as he did not feel like he was ready to go home.  He also says that both he and his wife consistently reported that he was not doing well saying that the documentation they read from the doctors and the social  worker did not match what they were telling them.  Glendia says that he has thoughts of suicide about every 30 minutes but denies any current plan or intent saying that he does not want to hurt his wife or loved ones.  He says that his wife is under particular stress as her mother was recently hospitalized.  Glendia says that he has worked at [employer] for the past 12 years or so and that his major stressor is his job.  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  He says that he has been having thoughts of suicide intermittently beginning within the past 4 to 5 years but that they became more continuous after receiving this promotion and after stopping his antidepressant medication.  He says that he has been depressed and anxious ever since he can remember going back to at least his early teen years.  He says that his mood was fairly good from 58 until 1993 saying that he got married in 1986 but that his brother was killed in a boating accident in 11.  On the day that he was hospitalized, he did not go into work and drove around with a shotgun trying to figure out where and how to commit suicide.  He says that he practiced it 20 times without the shotgun loaded but was  eventually unable to attempt to kill himself as he did not have the nerve to do so.  Eventually, his supervisor and friend from work was able to locate him leading to [facility] inpatient treatment.  The therapist and Glendia talk about treatment options with Glendia concluding that he needs some form of intensive outpatient treatment and that being seen for weekly therapy would not be enough at this time.  He says that he cannot attend 's mental health IOP or PHP as they are virtual and he does not have Internet at his house.  The therapist attempts to find in network providers by contacting [employer] but unfortunately the care  representative ends up not being able to provide resources that are useful.  The therapist assists Glendia in calling [facility] and they clinician does a brief phone screening and scheduled him for a phone assessment on this Thursday at 8 AM saying that there is no wait list and that he would be able to start the program immediately if all goes as planned.  Therapist obtains a ROI from [facility] and faxes his discharge summary and insurance information per the [facility] representative's request.  The therapist provided Glendia with the contact number and website to share his concerns about the documentation at [facility] not matching with what Glendia and his wife were telling his treatment providers.  Glendia informs this therapist that if his condition worsens such that he starts to have actual thoughts of harming himself that he will go immediately to the [facility] for assistance.  He is given this therapist's direct contact number and agrees to call him back with any additional questions or concerns or if anything goes awry with his admission to [facility].   Progress Towards Goals: Initial  Suicidal/Homicidal: Glendia denies any homicidal ideation but reports having suicidal ideation with no plan or intent.  Additionally, he indicates that he no longer has any firearms in his home.  Plan: Glendia will meet with Judyth Littles telephonically on June 26th at 8 AM so as to begin attending there IOP or PHP in person.  He will go to the [facility] if he starts to have thoughts of harming himself and will call this therapist back on a as needed basis.  He will continue to take his psychotropic medication as prescribed.  Diagnosis: Major Depression, Recurrent, Severe and GAD  Collaboration of Care: Other contacted [employer] to try and find in person PHPs and IOPs in-network for his insurance and spoke with a Facilities manager at [facility] with [name]'s permission and participation to arrange  aftercare  Patient/Guardian was advised Release of Information must be obtained prior to any record release in order to collaborate their care with an outside provider. Patient/Guardian was advised if they have not already done so to contact the registration department to sign all necessary forms in order for us  to release information regarding their care.   Consent: Patient/Guardian gives verbal consent for treatment and assignment of benefits for services provided during this visit. Patient/Guardian expressed understanding and agreed to proceed.   Zell Maier, MA, LCSW, [facility], LCAS 06/29/2023

## 2023-07-27 ENCOUNTER — Ambulatory Visit: Admitting: Physician Assistant

## 2023-08-13 ENCOUNTER — Ambulatory Visit: Attending: Cardiology | Admitting: Cardiology

## 2023-08-13 ENCOUNTER — Encounter: Payer: Self-pay | Admitting: Cardiology

## 2023-08-13 VITALS — BP 150/98 | HR 75 | Ht 70.0 in | Wt 232.4 lb

## 2023-08-13 DIAGNOSIS — E7849 Other hyperlipidemia: Secondary | ICD-10-CM

## 2023-08-13 DIAGNOSIS — G4733 Obstructive sleep apnea (adult) (pediatric): Secondary | ICD-10-CM

## 2023-08-13 DIAGNOSIS — Z79899 Other long term (current) drug therapy: Secondary | ICD-10-CM

## 2023-08-13 DIAGNOSIS — I77819 Aortic ectasia, unspecified site: Secondary | ICD-10-CM

## 2023-08-13 DIAGNOSIS — I10 Essential (primary) hypertension: Secondary | ICD-10-CM

## 2023-08-13 MED ORDER — LOSARTAN POTASSIUM 25 MG PO TABS: 25.0000 mg | ORAL_TABLET | Freq: Every day | ORAL | 3 refills | Status: AC

## 2023-08-13 NOTE — Patient Instructions (Signed)
 Medication Instructions:  Please START taking 25 mg of losartan  DAILY.  *If you need a refill on your cardiac medications before your next appointment, please call your pharmacy*  Lab Work: Please go to any [facility] in one week (08/20/23) to complete a BMET. You do not need to be fasting.  If you have labs (blood work) drawn today and your tests are completely normal, you will receive your results only by: MyChart Message (if you have MyChart) OR A paper copy in the mail If you have any lab test that is abnormal or we need to change your treatment, we will call you to review the results.  Testing/Procedures: Dr. Shlomo has ordered a coronary calcium  score CT. This is a test that can provide early detection of coronary artery disease. Patient cost is $99, this test is not billed through insurance.  Your physician has requested that you have an echocardiogram in one year. Echocardiography is a painless test that uses sound waves to create images of your heart. It provides your doctor with information about the size and shape of your heart and how well your heart's chambers and valves are working. This procedure takes approximately one hour. There are no restrictions for this procedure. Please do NOT wear cologne, perfume, aftershave, or lotions (deodorant is allowed). Please arrive 15 minutes prior to your appointment time.  Please note: We ask at that you not bring children with you during ultrasound (echo/ vascular) testing. Due to room size and safety concerns, children are not allowed in the ultrasound rooms during exams. Our front office staff cannot provide observation of children in our lobby area while testing is being conducted. An adult accompanying a patient to their appointment will only be allowed in the ultrasound room at the discretion of the ultrasound technician under special circumstances. We apologize for any inconvenience.   Follow-Up: At [facility], you and your  health needs are our priority.  As part of our continuing mission to provide you with exceptional heart care, our providers are all part of one team.  This team includes your primary Cardiologist (physician) and Advanced Practice Providers or APPs (Physician Assistants and Nurse Practitioners) who all work together to provide you with the care you need, when you need it.  Your next appointment:   1 year(s)  Provider:   Dr. Wilbert Shlomo, MD

## 2023-08-13 NOTE — Progress Notes (Signed)
 SLEEP MEDICINE OFFICE VISIT NOTE:   Date:  08/13/2023   ID:  Sean Alexander, DOB 1960/12/04, MRN [id number] The patient was identified using 2 identifiers.  PCP:  Patient, No Pcp Per  Cardiologist:  Debby Sor, MD (Inactive)    Referring MD: No ref. provider found   No chief complaint on file.   History of Present Illness:    Sean Alexander is a 63 y.o. male with a hx of anxiety, chronic insomnia, GAD, obstructive sleep apnea on CPAP.  Patient was followed for her sleep apnea by Dr. Sor in the past but since his retirement she is now here to set up care with me.  He last saw her on 05/10/2023 at which time he was doing well.  At last office visit his device could no longer do G5 Internet upgrade so he was ordered a new ResMed AirSense 11 AutoSet unit and is now back for 75-month follow-up.  He is doing well with his PAP device and thinks that he has gotten used to it.  He tolerates the full face mask and feels the pressure is adequate.   He has had some significant mouth or nasal dryness but no nasal congestion. He says that the dryness is not that bad.  He does not think that he snores. An Epworth Sleepiness Scale score was calculated the office today and this endorsed at 15 concerning for residual daytime sleepiness. He goes to bed around 2:30am and gets up at 8am so not getting enough sleep. Patient denies any episodes of bruxism, restless legs, No gagging hallucinations or cataplectic events.    Past Medical History:  Diagnosis Date   Anxiety 01/17/2014   Chronic insomnia 06/16/2023   Evaluate for Obstructive sleep apnea 01/17/2014   GAD (generalized anxiety disorder) 06/16/2023   H/O psychiatric hospitalization 06/16/2023   Hepatitis A virus infection    Hypersomnolence 01/17/2014   Kidney [place] 2017   Major depressive disorder, recurrent severe without psychotic features (HCC) 06/16/2023   OSA on CPAP 11/23/2014   RUQ pain 09/02/2016   Sleep apnea    uses CPAP   Suicide attempt  by firearm (HCC) 06/16/2023   aborted   Unilateral primary osteoarthritis, left knee 11/18/2020    Past Surgical History:  Procedure Laterality Date   CHOLECYSTECTOMY N/A 09/02/2016   Procedure: LAPAROSCOPIC CHOLECYSTECTOMY WITH INTRAOPERATIVE CHOLANGIOGRAM;  Surgeon: Lily Boas, MD;  Location: [facility];  Service: General;  Laterality: N/A;   HERNIA REPAIR  1986    Current Medications: Current Meds  Medication Sig   DULoxetine  (CYMBALTA ) 60 MG capsule Take 1 capsule (60 mg total) by mouth daily.   gabapentin  (NEURONTIN ) 300 MG capsule Take 1 capsule (300 mg total) by mouth at bedtime.   mirtazapine  (REMERON ) 30 MG tablet Take 1 tablet (30 mg total) by mouth at bedtime.   traZODone  (DESYREL ) 50 MG tablet Take 1 tablet (50 mg total) by mouth at bedtime.   [DISCONTINUED] doxycycline (VIBRAMYCIN) 100 MG capsule Take 100 mg by mouth 2 (two) times daily.     Allergies:   Sulfa antibiotics and Penicillins   Social History   Socioeconomic History   Marital status: Married    Spouse name: [name]   Number of children: 0   Years of education: Not on file   Highest education level: Not on file  Occupational History   Occupation: Psychiatrist  Tobacco Use   Smoking status: Never   Smokeless tobacco: Never  Vaping Use  Vaping status: Never Used  Substance and Sexual Activity   Alcohol use: No    Alcohol/week: 0.0 standard drinks of alcohol   Drug use: No   Sexual activity: Not Currently  Other Topics Concern   Not on file  Social History Narrative   Not on file   Social Drivers of Health   Financial Resource Strain: Not on file  Food Insecurity: No Food Insecurity (06/15/2023)   Hunger Vital Sign    Worried About Running Out of Food in the Last Year: Never true    Ran Out of Food in the Last Year: Never true  Transportation Needs: No Transportation Needs (06/15/2023)   PRAPARE - Administrator, Civil Service (Medical): No    Lack of  Transportation (Non-Medical): No  Physical Activity: Not on file  Stress: Not on file  Social Connections: Not on file     Family History: The patient's family history includes Diabetes in his brother; Heart disease in his father and mother. There is no history of Colon cancer or Colon polyps.  ROS:   Please see the history of present illness.    ROS  All other systems reviewed and negative.   EKGs/Labs/Other Studies Reviewed:    The following studies were reviewed today: PAP compliance download   Physical Exam:    VS:  BP (!) 150/98   Pulse 75   Ht 5' 10 (1.778 m)   Wt 232 lb 6.4 oz (105.4 kg)   SpO2 97%   BMI 33.35 kg/m     Wt Readings from Last 3 Encounters:  08/13/23 232 lb 6.4 oz (105.4 kg)  06/14/23 237 lb 3.4 oz (107.6 kg)  05/10/23 237 lb 3.2 oz (107.6 kg)    GEN: Well nourished, well developed in no acute distress HEENT: Normal NECK: No JVD; No carotid bruits LYMPHATICS: No lymphadenopathy CARDIAC:RRR, no murmurs, rubs, gallops RESPIRATORY:  Clear to auscultation without rales, wheezing or rhonchi  ABDOMEN: Soft, non-tender, non-distended MUSCULOSKELETAL:  No edema; No deformity  SKIN: Warm and dry NEUROLOGIC:  Alert and oriented x 3 PSYCHIATRIC:  Normal affect   ASSESSMENT:    1. OSA (obstructive sleep apnea)   2. Essential hypertension    PLAN:    In order of problems listed above:  OSA - The patient is tolerating PAP therapy well without any problems. The PAP download performed by his DME was personally reviewed and interpreted by me today and showed an AHI of 0.9 /hr on 8 cm H2O with 70% compliance in using more than 4 hours nightly.  The patient has been using and benefiting from PAP use and will continue to benefit from therapy.  -he still has daytime sleepiness by his Epworth sleepiness score but due to poor sleep hygiene and not going to bed early enough.   Hypertension - BP elevated on exam today - He had been on Toprol  but it was  stopped due to his psych meds - will start Losartan  25mg  daily  - check BMET in 1 week - nurse check in 2 weeks for BP check  Dilated ascending aorta -Chest CTA 03/2023 measured 4.1cm -check 2D echo in 1 year -needs good BP control  Coronary artery calcifications -this was noted in the LAD on recent Chest CTA -check coronary Ca score to assess future risk  Time Spent: 20 minutes total time of encounter, including 15 minutes spent in face-to-face patient care on the date of this encounter. This time includes coordination of care  and counseling regarding above mentioned problem list. Remainder of non-face-to-face time involved reviewing chart documents/testing relevant to the patient encounter and documentation in the medical record. I have independently reviewed documentation from referring provider  Medication Adjustments/Labs and Tests Ordered: Current medicines are reviewed at length with the patient today.  Concerns regarding medicines are outlined above.  No orders of the defined types were placed in this encounter.  No orders of the defined types were placed in this encounter.  Followup with me in 1 year  Signed, Wilbert Bihari, MD  08/13/2023 8:40 AM    [facility]

## 2023-08-13 NOTE — Addendum Note (Signed)
 Addended by: JANIT GENI CROME on: 08/13/2023 09:02 AM   Modules accepted: Orders

## 2023-08-20 ENCOUNTER — Telehealth: Payer: Self-pay | Admitting: Cardiology

## 2023-08-20 NOTE — Telephone Encounter (Signed)
 Wife Francie) stated patient will not be able to keep appointment on 8/22 due to work commitment and will be keeping a record of his BP readings.  Wife wants to know if she can just send in patient's BP readings in MyChart instead of visit.

## 2023-08-27 ENCOUNTER — Ambulatory Visit

## 2023-10-07 ENCOUNTER — Ambulatory Visit: Admitting: Physician Assistant

## 2023-10-15 ENCOUNTER — Ambulatory Visit (INDEPENDENT_AMBULATORY_CARE_PROVIDER_SITE_OTHER): Admitting: Family Medicine

## 2023-10-15 ENCOUNTER — Encounter: Payer: Self-pay | Admitting: Family Medicine

## 2023-10-15 VITALS — BP 120/80 | HR 92 | Temp 97.0°F | Resp 18 | Ht 70.0 in | Wt 230.8 lb

## 2023-10-15 DIAGNOSIS — Z9151 Personal history of suicidal behavior: Secondary | ICD-10-CM | POA: Diagnosis not present

## 2023-10-15 DIAGNOSIS — L247 Irritant contact dermatitis due to plants, except food: Secondary | ICD-10-CM | POA: Diagnosis not present

## 2023-10-15 DIAGNOSIS — F332 Major depressive disorder, recurrent severe without psychotic features: Secondary | ICD-10-CM | POA: Diagnosis not present

## 2023-10-15 DIAGNOSIS — F411 Generalized anxiety disorder: Secondary | ICD-10-CM

## 2023-10-15 DIAGNOSIS — I10 Essential (primary) hypertension: Secondary | ICD-10-CM

## 2023-10-15 DIAGNOSIS — F5104 Psychophysiologic insomnia: Secondary | ICD-10-CM

## 2023-10-15 DIAGNOSIS — I7121 Aneurysm of the ascending aorta, without rupture: Secondary | ICD-10-CM

## 2023-10-15 DIAGNOSIS — G4733 Obstructive sleep apnea (adult) (pediatric): Secondary | ICD-10-CM

## 2023-10-15 MED ORDER — TRIAMCINOLONE ACETONIDE 0.5 % EX OINT: 1.0000 | TOPICAL_OINTMENT | Freq: Two times a day (BID) | CUTANEOUS | 3 refills | Status: AC

## 2023-10-15 MED ORDER — CETIRIZINE HCL 10 MG PO TABS: 10.0000 mg | ORAL_TABLET | Freq: Every day | ORAL | 11 refills | Status: AC

## 2023-10-15 NOTE — Progress Notes (Signed)
 Assessment & Plan   Assessment/Plan:   Assessment and Plan Assessment & Plan Contact dermatitis, right arm and leg (plant exposure) Rash on right arm and leg, possibly due to insect bites or plant contact while working in the yard. Symptoms include redness, itching, and slight oozing. No severe symptoms reported. - Prescribe Zyrtec 10 mg to be taken at night as needed for itching relief. - Prescribe topical triamcinolone 0.5% twice daily for application to affected areas until remission - Follow-up if no improvement  Obstructive sleep apnea with hypersomnolence and chronic insomnia Uses CPAP for management. No recent changes in symptoms or treatment plan discussed.  Generalized anxiety disorder and major depressive disorder, recurrent severe, with history of suicide attempt Psychiatric hospitalization due to suicide ideation and attempt. Currently engaged with psychiatric care at [facility] for medication management and therapy. Expressed awareness of mental health challenges and the importance of support systems.  Thoracic aortic aneurysm Aortic dilation with aneurysm measuring 4.1 cm. Under regular surveillance by cardiology. No acute changes or interventions discussed.  Hypertension Well-controlled with blood pressure at home consistently around 120/80 mmHg. Managed with losartan  25 mg daily.       There are no discontinued medications.  Return in about 6 months (around 04/14/2024) for rash and HTN .        Subjective:   Encounter date: 10/15/2023  Sean Alexander is a 63 y.o. male who has OSA on CPAP; Generalized anxiety disorder; Chronic insomnia; Major depressive disorder, recurrent severe without psychotic features (HCC); History of suicide attempt ((06/2023)); Aneurysm of ascending aorta without rupture; Primary hypertension; and Irritant contact dermatitis due to plants, except food on their problem list..   He  has a past medical history of Anxiety (01/17/2014), Chronic  insomnia (06/16/2023), Evaluate for Obstructive sleep apnea (01/17/2014), GAD (generalized anxiety disorder) (06/16/2023), H/O psychiatric hospitalization (06/16/2023), Hepatitis A virus infection, Hypersomnolence (01/17/2014), Kidney Sean Alexander (2017), Major depressive disorder, recurrent severe without psychotic features (HCC) (06/16/2023), OSA on CPAP (11/23/2014), RUQ pain (09/02/2016), Sleep apnea, Suicide attempt by firearm [facility]) (06/16/2023), and Unilateral primary osteoarthritis, left knee (11/18/2020).Sean Alexander   He presents with chief complaint of Establish Care (Pt is fasting today //HM due- vaccinations ( Pt declined for today) and colonoscopy scheduled for next month ), Rash (Pt c/o of rash on right arm and leg for 2 days while working in the yard; very itchy ), and Hypertension (Pt blood pressure reading ar home range from 120/80) .   Discussed the use of AI scribe software for clinical note transcription with the patient, who gave verbal consent to proceed.  History of Present Illness Sean Alexander is a 63 year old male with chronic insomnia, obstructive sleep apnea, generalized anxiety disorder, and major depressive disorder who presents to establish care.  Sleep disturbance and hypersomnolence - Chronic insomnia and hypersomnolence attributed to obstructive sleep apnea - Uses CPAP machine for management - Persistent fatigue despite therapy  Aortic dilation - History of aortic dilation with aneurysm measuring 4.1 cm - No chest pain or shortness of breath - Follows with cardiology  Mood and anxiety symptoms - Generalized anxiety disorder and major depressive disorder, recurrent severe without psychotic features - History of psychiatric hospitalization following suicide attempt by firearm - Currently engaged in psychiatric care with psychiatrist and therapist at [facility] - Recently completed eight-week program at [facility]  Hypertension - Home blood pressure readings  approximately 120/80 mmHg - Takes losartan  25 mg daily  Hyperlipidemia - History of high cholesterol - Not  currently taking cholesterol-lowering medication  Dermatitis - Acute rash on right arm and leg developed two days ago while working in yard - Rash is red, pruritic, and oozing slightly - Possible etiology includes insect bites or contact with irritants while digging out tree roots  Nephrolithiasis - History of kidney stones  Osteoarthritis - History of osteoarthritis in left knee  Viral hepatitis - History of hepatitis A virus infection      10/15/2023   11:50 AM  Depression screen PHQ 2/9  Decreased Interest 1  Down, Depressed, Hopeless 1  PHQ - 2 Score 2  Altered sleeping 1  Tired, decreased energy 0  Change in appetite 0  Feeling bad or failure about yourself  0  Trouble concentrating 1  Moving slowly or fidgety/restless 1  Suicidal thoughts 0  PHQ-9 Score 5  Difficult doing work/chores Somewhat difficult      10/15/2023   11:50 AM  GAD 7 : Generalized Anxiety Score  Nervous, Anxious, on Edge 1  Control/stop worrying 1  Worry too much - different things 1  Trouble relaxing 0  Restless 0  Easily annoyed or irritable 2  Afraid - awful might happen 0  Total GAD 7 Score 5  Anxiety Difficulty Somewhat difficult      ROS  Past Surgical History:  Procedure Laterality Date   CHOLECYSTECTOMY N/A 09/02/2016   Procedure: LAPAROSCOPIC CHOLECYSTECTOMY WITH INTRAOPERATIVE CHOLANGIOGRAM;  Surgeon: Lily Boas, MD;  Location: [facility];  Service: General;  Laterality: N/A;   HERNIA REPAIR  1986    Outpatient Medications Prior to Visit  Medication Sig Dispense Refill   DULoxetine  (CYMBALTA ) 60 MG capsule Take 1 capsule (60 mg total) by mouth daily. 30 capsule 0   gabapentin  (NEURONTIN ) 300 MG capsule Take 1 capsule (300 mg total) by mouth at bedtime. 30 capsule 0   losartan  (COZAAR ) 25 MG tablet Take 1 tablet (25 mg total) by mouth daily. 90 tablet 3    mirtazapine  (REMERON ) 30 MG tablet Take 1 tablet (30 mg total) by mouth at bedtime. 30 tablet 0   traZODone  (DESYREL ) 50 MG tablet Take 1 tablet (50 mg total) by mouth at bedtime. 30 tablet 0   No facility-administered medications prior to visit.    Family History  Problem Relation Age of Onset   Heart disease Mother    Heart disease Father    Diabetes Brother    Colon cancer Neg Hx    Colon polyps Neg Hx     Social History   Socioeconomic History   Marital status: Married    Spouse name: [name]   Number of children: 0   Years of education: Not on file   Highest education level: Not on file  Occupational History   Occupation: Psychiatrist  Tobacco Use   Smoking status: Never   Smokeless tobacco: Never  Vaping Use   Vaping status: Never Used  Substance and Sexual Activity   Alcohol use: No    Alcohol/week: 0.0 standard drinks of alcohol   Drug use: No   Sexual activity: Not Currently  Other Topics Concern   Not on file  Social History Narrative   Not on file   Social Drivers of Health   Financial Resource Strain: Not on file  Food Insecurity: No Food Insecurity (06/15/2023)   Hunger Vital Sign    Worried About Running Out of Food in the Last Year: Never true    Ran Out of Food in the Last Year: Never true  Transportation Needs: No Transportation Needs (06/15/2023)   PRAPARE - Administrator, Civil Service (Medical): No    Lack of Transportation (Non-Medical): No  Physical Activity: Not on file  Stress: Not on file  Social Connections: Not on file  Intimate Partner Violence: Not At Risk (06/15/2023)   Humiliation, Afraid, Rape, and Kick questionnaire    Fear of Current or Ex-Partner: No    Emotionally Abused: No    Physically Abused: No    Sexually Abused: No                                                                                                  Objective:  Physical Exam: BP 120/80 Comment: @home  reading  Pulse 92    Temp (!) 97 F (36.1 C) (Temporal)   Resp 18   Ht 5' 10 (1.778 m)   Wt 230 lb 12.8 oz (104.7 kg)   SpO2 95%   BMI 33.12 kg/m    Physical Exam GENERAL: Alert, cooperative, well developed, no acute distress HEENT: Normocephalic, normal oropharynx, moist mucous membranes CHEST: Clear to auscultation bilaterally, No wheezes, rhonchi, or crackles CARDIOVASCULAR: Normal heart rate and rhythm, S1 and S2 normal without murmurs ABDOMEN: Soft, non-tender, non-distended, without organomegaly, Normal bowel sounds EXTREMITIES: No cyanosis or edema NEUROLOGICAL: Cranial nerves grossly intact, Moves all extremities without gross motor or sensory deficit   Physical Exam  No results found.  No results found for this or any previous visit (from the past 2160 hours).      Beverley Adine Hummer, MD, MS

## 2023-10-15 NOTE — Patient Instructions (Addendum)
 I have sent cetirizine (ZYRTEC) 10 MG and triamcinolone ointment (KENALOG) 0.5 % to your pharmacy. Please follow up in 6 months or sooner if symptoms worsens.

## 2023-10-16 DIAGNOSIS — I10 Essential (primary) hypertension: Secondary | ICD-10-CM | POA: Insufficient documentation

## 2023-10-16 DIAGNOSIS — I7121 Aneurysm of the ascending aorta, without rupture: Secondary | ICD-10-CM | POA: Insufficient documentation

## 2023-10-16 DIAGNOSIS — Z9151 Personal history of suicidal behavior: Secondary | ICD-10-CM | POA: Insufficient documentation

## 2023-10-16 DIAGNOSIS — L247 Irritant contact dermatitis due to plants, except food: Secondary | ICD-10-CM | POA: Insufficient documentation

## 2023-12-15 NOTE — Telephone Encounter (Signed)
 Second attempt. Letter sent asking patient to submit BP log.

## 2023-12-27 NOTE — Telephone Encounter (Signed)
 Call to patient to obtain home blood pressure readings. Spoke with spouse [name] [facility]) who states she will ask patient to send in BP readings. Third attempt.

## 2024-04-14 ENCOUNTER — Ambulatory Visit: Admitting: Family Medicine
